# Patient Record
Sex: Female | Born: 1945
Health system: Southern US, Community
[De-identification: ages and names within clinical notes are randomized; demographics above are authoritative.]

## PROBLEM LIST (undated history)

## (undated) DIAGNOSIS — E782 Mixed hyperlipidemia: Secondary | ICD-10-CM

## (undated) DIAGNOSIS — M1712 Unilateral primary osteoarthritis, left knee: Secondary | ICD-10-CM

## (undated) DIAGNOSIS — I10 Essential (primary) hypertension: Secondary | ICD-10-CM

## (undated) HISTORY — DX: Unilateral primary osteoarthritis, left knee: M17.12

## (undated) HISTORY — DX: Essential (primary) hypertension: I10

## (undated) HISTORY — DX: Mixed hyperlipidemia: E78.2

---

## 1998-02-11 ENCOUNTER — Emergency Department (HOSPITAL_COMMUNITY): Admission: EM | Admit: 1998-02-11 | Discharge: 1998-02-11 | Payer: Self-pay | Admitting: Internal Medicine

## 1998-02-14 ENCOUNTER — Other Ambulatory Visit: Admission: RE | Admit: 1998-02-14 | Discharge: 1998-02-14 | Payer: Self-pay | Admitting: Obstetrics and Gynecology

## 1998-02-20 ENCOUNTER — Ambulatory Visit (HOSPITAL_COMMUNITY): Admission: RE | Admit: 1998-02-20 | Discharge: 1998-02-20 | Payer: Self-pay | Admitting: Obstetrics and Gynecology

## 1998-05-09 ENCOUNTER — Emergency Department (HOSPITAL_COMMUNITY): Admission: EM | Admit: 1998-05-09 | Discharge: 1998-05-09 | Payer: Self-pay | Admitting: Emergency Medicine

## 1998-06-27 ENCOUNTER — Emergency Department (HOSPITAL_COMMUNITY): Admission: EM | Admit: 1998-06-27 | Discharge: 1998-06-27 | Payer: Self-pay | Admitting: Emergency Medicine

## 2000-01-11 ENCOUNTER — Emergency Department (HOSPITAL_COMMUNITY): Admission: EM | Admit: 2000-01-11 | Discharge: 2000-01-11 | Payer: Self-pay | Admitting: *Deleted

## 2006-12-22 ENCOUNTER — Emergency Department (HOSPITAL_COMMUNITY): Admission: EM | Admit: 2006-12-22 | Discharge: 2006-12-22 | Payer: Self-pay | Admitting: Emergency Medicine

## 2007-08-02 ENCOUNTER — Emergency Department (HOSPITAL_COMMUNITY): Admission: EM | Admit: 2007-08-02 | Discharge: 2007-08-02 | Payer: Self-pay | Admitting: Emergency Medicine

## 2007-08-15 ENCOUNTER — Emergency Department (HOSPITAL_COMMUNITY): Admission: EM | Admit: 2007-08-15 | Discharge: 2007-08-15 | Payer: Self-pay | Admitting: Emergency Medicine

## 2012-03-28 ENCOUNTER — Emergency Department (HOSPITAL_COMMUNITY)
Admission: EM | Admit: 2012-03-28 | Discharge: 2012-03-28 | Disposition: A | Discharge status: Home / Self Care | Payer: Medicare HMO | Attending: Emergency Medicine | Admitting: Emergency Medicine

## 2012-03-28 ENCOUNTER — Encounter (HOSPITAL_COMMUNITY): Payer: Self-pay | Admitting: *Deleted

## 2012-03-28 DIAGNOSIS — S0180XA Unspecified open wound of other part of head, initial encounter: Secondary | ICD-10-CM | POA: Insufficient documentation

## 2012-03-28 DIAGNOSIS — Y92410 Unspecified street and highway as the place of occurrence of the external cause: Secondary | ICD-10-CM | POA: Insufficient documentation

## 2012-03-28 DIAGNOSIS — S0181XA Laceration without foreign body of other part of head, initial encounter: Secondary | ICD-10-CM

## 2012-03-28 MED ORDER — HYDROCODONE-ACETAMINOPHEN 5-500 MG PO TABS: 1.0000 | ORAL_TABLET | Freq: Four times a day (QID) | ORAL | Status: AC | PRN

## 2012-03-28 NOTE — ED Notes (Signed)
Pt has small lac over R eye. Bleeding controlled. Pt denies LOC. Pt a/o x 4. Pt denies pain elsewhere.

## 2012-03-28 NOTE — ED Notes (Signed)
Wound cleaned.
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
## 2012-03-28 NOTE — ED Notes (Signed)
[id number]<VW> Expected date:03/28/12<BR> Expected time: 7:49 PM<BR> Means of arrival:Ambulance<BR> Comments:<BR> Medical clearance
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
## 2012-03-29 NOTE — ED Provider Notes (Signed)
Medical screening examination/treatment/procedure(s) were performed by non-physician practitioner and as supervising physician I was immediately available for consultation/collaboration.   Gwyneth Sprout, MD 03/29/12 2107

## 2012-10-24 ENCOUNTER — Encounter (HOSPITAL_COMMUNITY): Payer: Self-pay | Admitting: Emergency Medicine

## 2012-10-24 ENCOUNTER — Emergency Department (HOSPITAL_COMMUNITY)
Admission: EM | Admit: 2012-10-24 | Discharge: 2012-10-24 | Disposition: A | Discharge status: Home / Self Care | Payer: Medicare HMO | Attending: Emergency Medicine | Admitting: Emergency Medicine

## 2012-10-24 ENCOUNTER — Emergency Department (HOSPITAL_COMMUNITY): Payer: Medicare HMO

## 2012-10-24 DIAGNOSIS — M25475 Effusion, left foot: Secondary | ICD-10-CM

## 2012-10-24 DIAGNOSIS — M25476 Effusion, unspecified foot: Secondary | ICD-10-CM | POA: Insufficient documentation

## 2012-10-24 DIAGNOSIS — M85672 Other cyst of bone, left ankle and foot: Secondary | ICD-10-CM

## 2012-10-24 DIAGNOSIS — M25473 Effusion, unspecified ankle: Secondary | ICD-10-CM | POA: Insufficient documentation

## 2012-10-24 DIAGNOSIS — M8569 Other cyst of bone, multiple sites: Secondary | ICD-10-CM | POA: Insufficient documentation

## 2012-10-24 MED ORDER — NAPROXEN 375 MG PO TABS: 375.0000 mg | ORAL_TABLET | Freq: Two times a day (BID) | ORAL | Status: DC

## 2012-10-24 NOTE — ED Notes (Signed)
Patient transported to X-ray 

## 2012-10-24 NOTE — ED Provider Notes (Signed)
History     CSN: [id number]  Arrival date & time 10/24/12  1012   First MD Initiated Contact with Patient 10/24/12 1034      Chief Complaint  Patient presents with  . Foot Pain    (Consider location/radiation/quality/duration/timing/severity/associated sxs/prior treatment) HPI Ebony Mayer is a 67 y.o. female who presents with complaint of swelling to the top of the left foot yesterday. States woke up wiht it. Area is "mildly" tender. No injury. No history of the same. No pain with walking or moving of the ankle or foot. Has not tried any medications or treatment for it. History reviewed. No pertinent past medical history.  History reviewed. No pertinent past surgical history.  No family history on file.  History  Substance Use Topics  . Smoking status: Not on file  . Smokeless tobacco: Not on file  . Alcohol Use: No    OB History   Grav Para Term Preterm Abortions TAB SAB Ect Mult Living                  Review of Systems  Constitutional: Negative for fever and chills.  Musculoskeletal: Positive for joint swelling and arthralgias.  Skin: Negative for rash.  Neurological: Negative for weakness and numbness.    Allergies  Review of patient's allergies indicates no known allergies.  Home Medications   Current Outpatient Rx  Name  Route  Sig  Dispense  Refill  . aspirin 325 MG tablet   Oral   Take 650 mg by mouth once.           BP 95/67  Pulse 99  Temp(Src) 97.9 F (36.6 C) (Oral)  Resp 17  SpO2 100%  Physical Exam  Nursing note and vitals reviewed. Constitutional: She appears well-developed and well-nourished. No distress.  Cardiovascular: Normal rate, regular rhythm and normal heart sounds.   Pulmonary/Chest: Effort normal and breath sounds normal. No respiratory distress. She has no wheezes. She has no rales.  Musculoskeletal:  There is a round, well circumscribed cystic mass to the left dorsal foot. Area is not erythematous, only mildly  tender. Full ROM of the foot. Normal pulses.   Neurological: She is alert.  Skin: Skin is warm.    ED Course  Procedures (including critical care time)  Labs Reviewed - No data to display No results found. No results found for this or any previous visit. Dg Foot Complete Left  10/24/2012  *RADIOLOGY REPORT*  Clinical Data: , no known injury, dorsal foot pain  LEFT FOOT - COMPLETE 3+ VIEW  Comparison: None.  Findings: Three views of the left foot submitted.  No acute fracture or subluxation.  Mild dorsal spurring tarsal region.  Mild soft tissue swelling dorsal tarsal region.  Plantar spur of the calcaneus.  IMPRESSION: No acute fracture or subluxation.  Mild dorsal spurring tarsal region.  Mild soft tissue swelling dorsal tarsal region.  Plantar spur of the calcaneus.   Original Report Authenticated By: Natasha Mead, M.D.        1. Other cyst of bone, left ankle and foot   2. Swelling of foot joint, left       MDM  PT with very mildly tender cystic swelling to the left foot. No erythema or signs of infection. There is dorsal spurring that seen on x-ray. Suspect this is the cause of her swelling, with possible cyst formation. i do not think it is infected. She is afebrile, non toxic. Will try tx with elevation, ACE wrap, ice,  follow up with podietrist and orthopedics.        Lottie Mussel, PA 10/24/12 1218

## 2012-10-24 NOTE — ED Notes (Signed)
States that she has an area on the anterior aspect of her left foot swell this am.

## 2012-10-26 NOTE — ED Provider Notes (Signed)
Medical screening examination/treatment/procedure(s) were performed by non-physician practitioner and as supervising physician I was immediately available for consultation/collaboration.  Adna Nofziger T Aundrea Horace, MD 10/26/12 0753 

## 2013-10-10 ENCOUNTER — Encounter: Payer: Self-pay | Admitting: Podiatry

## 2013-10-10 ENCOUNTER — Ambulatory Visit (INDEPENDENT_AMBULATORY_CARE_PROVIDER_SITE_OTHER): Payer: Self-pay | Admitting: Podiatry

## 2013-10-10 VITALS — BP 161/72 | HR 98 | Ht 65.0 in | Wt 154.0 lb

## 2013-10-10 DIAGNOSIS — Q828 Other specified congenital malformations of skin: Secondary | ICD-10-CM

## 2013-10-10 DIAGNOSIS — M25571 Pain in right ankle and joints of right foot: Secondary | ICD-10-CM | POA: Insufficient documentation

## 2013-10-10 DIAGNOSIS — M25579 Pain in unspecified ankle and joints of unspecified foot: Secondary | ICD-10-CM

## 2013-10-10 DIAGNOSIS — B351 Tinea unguium: Secondary | ICD-10-CM | POA: Insufficient documentation

## 2013-10-10 NOTE — Progress Notes (Signed)
Subjective: 68 year old female presents with painful feet. Pain from corns in between 4th web space left, multiple plantar calluses and thick nails.  Objective: Thick hypertrophic deformed nails x 10. HM in 4th web space left. Contracted digit with digital corn 5th bilateral. Plantar callus under 2nd and 5th MPJ bilateral. No open lesions. No edema or erythema noted. Neurovascular status are within normal.  Assessment: Painful corns, calluses and mycotic nails bilateral.  Plan: All corns, calluses, and nails debrided.

## 2013-10-10 NOTE — Patient Instructions (Signed)
Seen for hypertrophic nails, corns, and calluses. All nails, corns, and calluses debrided. Return in 3 months or as needed.  

## 2016-04-27 ENCOUNTER — Encounter (HOSPITAL_COMMUNITY): Payer: Self-pay

## 2016-04-27 ENCOUNTER — Emergency Department (HOSPITAL_COMMUNITY)
Admission: EM | Admit: 2016-04-27 | Discharge: 2016-04-27 | Disposition: A | Discharge status: Home / Self Care | Payer: Commercial Managed Care - HMO | Attending: Emergency Medicine | Admitting: Emergency Medicine

## 2016-04-27 DIAGNOSIS — R109 Unspecified abdominal pain: Secondary | ICD-10-CM | POA: Diagnosis not present

## 2016-04-27 DIAGNOSIS — R1032 Left lower quadrant pain: Secondary | ICD-10-CM | POA: Diagnosis not present

## 2016-04-27 LAB — CBC WITH DIFFERENTIAL/PLATELET
BASOS PCT: 0 %
Basophils Absolute: 0 10*3/uL (ref 0.0–0.1)
Eosinophils Absolute: 0 10*3/uL (ref 0.0–0.7)
Eosinophils Relative: 1 %
HEMATOCRIT: 39.3 % (ref 36.0–46.0)
HEMOGLOBIN: 12.9 g/dL (ref 12.0–15.0)
LYMPHS ABS: 0.9 10*3/uL (ref 0.7–4.0)
Lymphocytes Relative: 23 %
MCH: 29.9 pg (ref 26.0–34.0)
MCHC: 32.8 g/dL (ref 30.0–36.0)
MCV: 91.2 fL (ref 78.0–100.0)
MONOS PCT: 6 %
Monocytes Absolute: 0.2 10*3/uL (ref 0.1–1.0)
NEUTROS ABS: 2.7 10*3/uL (ref 1.7–7.7)
NEUTROS PCT: 70 %
Platelets: 136 10*3/uL — ABNORMAL LOW (ref 150–400)
RBC: 4.31 MIL/uL (ref 3.87–5.11)
RDW: 12.2 % (ref 11.5–15.5)
WBC: 3.9 10*3/uL — ABNORMAL LOW (ref 4.0–10.5)

## 2016-04-27 LAB — URINALYSIS, ROUTINE W REFLEX MICROSCOPIC
BILIRUBIN URINE: NEGATIVE
GLUCOSE, UA: NEGATIVE mg/dL
Ketones, ur: NEGATIVE mg/dL
LEUKOCYTES UA: NEGATIVE
Nitrite: NEGATIVE
PH: 7 (ref 5.0–8.0)
Protein, ur: NEGATIVE mg/dL
Specific Gravity, Urine: 1.005 (ref 1.005–1.030)

## 2016-04-27 LAB — COMPREHENSIVE METABOLIC PANEL
ALBUMIN: 4.2 g/dL (ref 3.5–5.0)
ALK PHOS: 65 U/L (ref 38–126)
ALT: 13 U/L — AB (ref 14–54)
ANION GAP: 4 — AB (ref 5–15)
AST: 18 U/L (ref 15–41)
BILIRUBIN TOTAL: 0.7 mg/dL (ref 0.3–1.2)
BUN: 10 mg/dL (ref 6–20)
CALCIUM: 8.8 mg/dL — AB (ref 8.9–10.3)
CO2: 26 mmol/L (ref 22–32)
CREATININE: 0.71 mg/dL (ref 0.44–1.00)
Chloride: 109 mmol/L (ref 101–111)
GFR calc Af Amer: >60 mL/min (ref >60)
GFR calc non Af Amer: >60 mL/min (ref >60)
GLUCOSE: 105 mg/dL — AB (ref 65–99)
Potassium: 4.1 mmol/L (ref 3.5–5.1)
Sodium: 139 mmol/L (ref 135–145)
TOTAL PROTEIN: 7.3 g/dL (ref 6.5–8.1)

## 2016-04-27 LAB — LIPASE, BLOOD: Lipase: 20 U/L (ref 11–51)

## 2016-04-27 LAB — URINE MICROSCOPIC-ADD ON

## 2016-04-27 MED ORDER — IBUPROFEN 600 MG PO TABS: 600.0000 mg | ORAL_TABLET | Freq: Four times a day (QID) | ORAL | 0 refills | Status: DC | PRN

## 2016-04-27 MED ORDER — CYCLOBENZAPRINE HCL 10 MG PO TABS: 10.0000 mg | ORAL_TABLET | Freq: Two times a day (BID) | ORAL | 0 refills | Status: DC | PRN

## 2016-04-27 NOTE — Discharge Instructions (Signed)
Take your medications as prescribed to stand for pain relief. Please follow up with a primary care provider from the Resource Guide provided below in 1 week as needed. Please return to the Emergency Department if symptoms worsen or new onset of fever, abdominal pain, back/flank pain, pain with urination, numbness, tingling, weakness.

## 2016-04-27 NOTE — Progress Notes (Signed)
Pt confirmed with ED CM that she does not have a [employer] pcp Cm provided her with a 5 page list of providers and discussed the importance of follow pcp care

## 2016-04-27 NOTE — ED Triage Notes (Signed)
Pt presents with c/o left side pain. Pt is in the left lower quadrant, no flank pain, vomiting, or diarrhea. Pt reports the pain is present for the most part with movement.

## 2016-04-27 NOTE — ED Provider Notes (Signed)
[facility]-EMERGENCY DEPT Provider Note   CSN: [id number] Arrival date & time: 04/27/16  1056     History   Chief Complaint Chief Complaint  Patient presents with  . Side pain    HPI Ebony Mayer is a 70 y.o. female.  Patient is a 70 year old female with no pertinent past medical history presents the ED with complaint of left side pain, onset 3 days. Patient reports when she woke up Sunday morning she began having a mild dull aching pain to the left side of her abdomen/back. Patient reports pain is worse with movement or bending. Denies any known fall, trauma or injury but notes she has been working over the past few days which requires her to do lots of bending and squatting. Denies fever, chills, headache, lightheadedness, dizziness, chest pain, shortness of breath, cough, abdominal pain, nausea, vomiting, diarrhea, constipation, urinary symptoms, flank pain, back pain. Denies taking any medications at home.      History reviewed. No pertinent past medical history.  Patient Active Problem List   Diagnosis Date Noted  . Onychomycosis 10/10/2013  . Porokeratosis 10/10/2013  . Pain, joint, foot, right 10/10/2013    History reviewed. No pertinent surgical history.  OB History    No data available       Home Medications    Prior to Admission medications   Not on File    Family History No family history on file.  Social History Social History  Substance Use Topics  . Smoking status: Never Smoker  . Smokeless tobacco: Never Used  . Alcohol use No     Allergies   Review of patient's allergies indicates no known allergies.   Review of Systems Review of Systems  Musculoskeletal: Positive for arthralgias (left sided pain).  All other systems reviewed and are negative.    Physical Exam Updated Vital Signs BP 148/77   Pulse 84   Temp 98.1 F (36.7 C)   Resp 18   Ht 5\' 5"  (1.651 m)   Wt 72.1 kg   SpO2 100%   BMI 26.46 kg/m   Physical Exam    Constitutional: She is oriented to person, place, and time. She appears well-developed and well-nourished. No distress.  HENT:  Head: Normocephalic and atraumatic.  Mouth/Throat: Oropharynx is clear and moist. No oropharyngeal exudate.  Eyes: Conjunctivae and EOM are normal. Right eye exhibits no discharge. Left eye exhibits no discharge. No scleral icterus.  Neck: Normal range of motion. Neck supple.  Cardiovascular: Normal rate, regular rhythm, normal heart sounds and intact distal pulses.   Pulmonary/Chest: Effort normal and breath sounds normal. No respiratory distress. She has no wheezes. She has no rales. She exhibits no tenderness.  Abdominal: Soft. Bowel sounds are normal. She exhibits no distension and no mass. There is no tenderness. There is no rebound and no guarding. No hernia.  No CVA tenderness  Musculoskeletal: Normal range of motion. She exhibits tenderness (TTP over left abdominal obliques). She exhibits no edema.  FROM of bilateral hips with 5/5 strength. Pt reports pain to left lateral abdomen/side with flexion of left hip. 2+ DP pulses. Sensation grossly intact. No midline C, T, or L tenderness. Full range of motion of neck and back. Full range of motion of bilateral upper and lower extremities, with 5/5 strength. Patient able to stand and ambulate without assistance.    Neurological: She is alert and oriented to person, place, and time.  Skin: Skin is warm and dry. She is not diaphoretic.  Nursing note and  vitals reviewed.    ED Treatments / Results  Labs (all labs ordered are listed, but only abnormal results are displayed) Labs Reviewed  CBC WITH DIFFERENTIAL/PLATELET - Abnormal; Notable for the following:       Result Value   WBC 3.9 (*)    Platelets 136 (*)    All other components within normal limits  COMPREHENSIVE METABOLIC PANEL - Abnormal; Notable for the following:    Glucose, Bld 105 (*)    Calcium 8.8 (*)    ALT 13 (*)    Anion gap 4 (*)    All other  components within normal limits  URINALYSIS, ROUTINE W REFLEX MICROSCOPIC (NOT AT [facility]) - Abnormal; Notable for the following:    Hgb urine dipstick TRACE (*)    All other components within normal limits  URINE MICROSCOPIC-ADD ON - Abnormal; Notable for the following:    Squamous Epithelial / LPF 0-5 (*)    Bacteria, UA RARE (*)    All other components within normal limits  LIPASE, BLOOD    EKG  EKG Interpretation None       Radiology No results found.  Procedures Procedures (including critical care time)  Medications Ordered in ED Medications - No data to display   Initial Impression / Assessment and Plan / ED Course  I have reviewed the triage vital signs and the nursing notes.  Pertinent labs & imaging results that were available during my care of the patient were reviewed by me and considered in my medical decision making (see chart for details).  Clinical Course   Pt presents with left sided lateral abdominal/trunk pain for the past 3 days, worse with movement. Denies any other sxs. Reports bending and lifting a lot with her job. Denies any known injury. VSS. Exam revealed mild TTP of left lateral oblique, no abdominal tenderness, no CVA tenderness, remaining exam unremarkable. Pt reports her pain has improved since arrival to the ED. Labs and UA unremarkable. I suspect pt's sxs are likeley due to due to muscle strain associated with repetitive movement at her job. Plan to d/c pt home with symptomatic tx and PCP follow up. Discussed return precautions with pt.   Final Clinical Impressions(s) / ED Diagnoses   Final diagnoses:  None    New Prescriptions New Prescriptions   No medications on file     Barrett Henleicole Elizabeth Perley Arthurs, PA-C 04/27/16 1520    Raeford RazorStephen Kohut, MD 05/01/16 1327

## 2016-04-29 ENCOUNTER — Emergency Department (HOSPITAL_COMMUNITY)
Admission: EM | Admit: 2016-04-29 | Discharge: 2016-04-29 | Disposition: A | Discharge status: Home / Self Care | Payer: Commercial Managed Care - HMO | Attending: Emergency Medicine | Admitting: Emergency Medicine

## 2016-04-29 ENCOUNTER — Encounter (HOSPITAL_COMMUNITY): Payer: Self-pay | Admitting: Emergency Medicine

## 2016-04-29 DIAGNOSIS — S39011D Strain of muscle, fascia and tendon of abdomen, subsequent encounter: Secondary | ICD-10-CM | POA: Insufficient documentation

## 2016-04-29 DIAGNOSIS — Z791 Long term (current) use of non-steroidal anti-inflammatories (NSAID): Secondary | ICD-10-CM | POA: Insufficient documentation

## 2016-04-29 DIAGNOSIS — X501XXD Overexertion from prolonged static or awkward postures, subsequent encounter: Secondary | ICD-10-CM | POA: Insufficient documentation

## 2016-04-29 DIAGNOSIS — R1032 Left lower quadrant pain: Secondary | ICD-10-CM | POA: Diagnosis present

## 2016-04-29 MED ORDER — TRAMADOL HCL 50 MG PO TABS: 100.0000 mg | ORAL_TABLET | Freq: Four times a day (QID) | ORAL | 0 refills | Status: DC | PRN

## 2016-04-29 NOTE — ED Provider Notes (Signed)
[facility]-EMERGENCY DEPT Provider Note   CSN: [id number] Arrival date & time: 04/29/16  1209     History   Chief Complaint Chief Complaint  Patient presents with  . Abdominal Pain    HPI Ebony Mayer is a 70 y.o. female.  HPI Patient was evaluated 3 days ago for the same pain complaint. She has had a proximally 5 days of discomfort in her left anterior hip\lower abdomen that radiates to the upper thigh. Patient is pain-free if she is at rest or seated. Pain is only elicited if she is in a standing position or flexes at the hip. No associated GI, GU or respiratory symptoms on review of systems. No associated weakness numbness or tingling to the leg. Patient denies significant relief of symptoms with ibuprofen. For work, the patient does janitorial jobs and mopping. She reports it does require a lot of movement and bending but has not had a specific injury that she recalls. History reviewed. No pertinent past medical history.  Patient Active Problem List   Diagnosis Date Noted  . Onychomycosis 10/10/2013  . Porokeratosis 10/10/2013  . Pain, joint, foot, right 10/10/2013    History reviewed. No pertinent surgical history.  OB History    No data available       Home Medications    Prior to Admission medications   Medication Sig Start Date End Date Taking? Authorizing Provider  cyclobenzaprine (FLEXERIL) 10 MG tablet Take 1 tablet (10 mg total) by mouth 2 (two) times daily as needed for muscle spasms. 04/27/16  Yes Barrett HenleNicole Elizabeth Nadeau, PA-C  ibuprofen (ADVIL,MOTRIN) 600 MG tablet Take 1 tablet (600 mg total) by mouth every 6 (six) hours as needed. 04/27/16  Yes Barrett HenleNicole Elizabeth Nadeau, PA-C  traMADol (ULTRAM) 50 MG tablet Take 2 tablets (100 mg total) by mouth every 6 (six) hours as needed. 04/29/16   Arby BarretteMarcy Gaylon Bentz, MD    Family History History reviewed. No pertinent family history.  Social History Social History  Substance Use Topics  . Smoking status: Never Smoker  .  Smokeless tobacco: Never Used  . Alcohol use No     Allergies   Review of patient's allergies indicates no known allergies.   Review of Systems Review of Systems 10 Systems reviewed and are negative for acute change except as noted in the HPI.   Physical Exam Updated Vital Signs BP 156/87   Pulse 94   Temp 98.2 F (36.8 C) (Oral)   Resp 18   SpO2 100%   Physical Exam  Constitutional: She appears well-developed and well-nourished. No distress.  HENT:  Head: Normocephalic and atraumatic.  Eyes: EOM are normal.  Neck: Neck supple.  Cardiovascular: Normal rate and regular rhythm.   No murmur heard. Pulmonary/Chest: Effort normal and breath sounds normal. No respiratory distress.  Abdominal: Soft. She exhibits no distension and no mass. There is no tenderness. There is no guarding.  Pain is slightly reproducible at the anterior inguinal ligament of the lower abdomen. There is no palpable mass in this region. Femoral pulses are 2+ and symmetric. The abdomen itself is nontender to deep palpation. Pain is also elicited in this focal region by having the patient either sit up from supine position as well as stand and put weight on the left hip.  Musculoskeletal: Normal range of motion. She exhibits no edema or deformity. Tenderness: range of motion is intact.no gait dysfunction.  Neurological: She is alert.  Skin: Skin is warm and dry.  Psychiatric: She has a normal mood  and affect.  Nursing note and vitals reviewed.    ED Treatments / Results  Labs (all labs ordered are listed, but only abnormal results are displayed) Labs Reviewed - No data to display  EKG  EKG Interpretation None       Radiology No results found.  Procedures Procedures (including critical care time)  Medications Ordered in ED Medications - No data to display   Initial Impression / Assessment and Plan / ED Course  I have reviewed the triage vital signs and the nursing notes.  Pertinent labs  & imaging results that were available during my care of the patient were reviewed by me and considered in my medical decision making (see chart for details).  Clinical Course    Final Clinical Impressions(s) / ED Diagnoses   Final diagnoses:  Abdominal wall strain, subsequent encounter  At this time, I still feel this is most likely muscular skeletal pain. It is only reproducible by certain positions in no associated symptoms have developed. The patient is otherwise well appearance. Diagnostic evaluation was negative for yesterday. The patient is counseled on continued ibuprofen and Flexeril. Tramadol's added. She is advised to give 5-7 days to see if symptoms improve or resolve. She is also counseled to return if new or changing symptoms develop. She is aware of the need to schedule follow-up to reassess this problem.  New Prescriptions New Prescriptions   TRAMADOL (ULTRAM) 50 MG TABLET    Take 2 tablets (100 mg total) by mouth every 6 (six) hours as needed.     Arby BarretteMarcy Lakin Rhine, MD 04/29/16 [phone]

## 2016-04-29 NOTE — Discharge Instructions (Signed)
Continue to take ibuprofen 3 times a day with food for the next 3-5 days. Take Flexeril as prescribed as well. You may take tramadol if needed as prescribed. You need to schedule recheck with a family doctor. Return to the emergency department if you develop new or changing symptoms. If your pain has not improved or resolved within the next one to 2 weeks, you may need additional testing to look for another cause for your pain.

## 2016-04-29 NOTE — ED Triage Notes (Signed)
Pt c/o pain to left hip, leg, and LLQ abdomen on standing onset Sunday, seen in ED for same on Tuesday and diagnosed with muscle strain. No pain while sitting. No nausea, emesis, or diarrhea.

## 2016-05-13 ENCOUNTER — Ambulatory Visit: Payer: Self-pay | Admitting: Nurse Practitioner

## 2016-05-25 ENCOUNTER — Encounter: Payer: Self-pay | Admitting: Internal Medicine

## 2016-05-25 ENCOUNTER — Ambulatory Visit: Payer: Commercial Managed Care - HMO | Attending: Internal Medicine | Admitting: Internal Medicine

## 2016-05-25 VITALS — BP 145/100 | HR 116 | Temp 98.2°F | Resp 16 | Wt 158.2 lb

## 2016-05-25 DIAGNOSIS — I10 Essential (primary) hypertension: Secondary | ICD-10-CM

## 2016-05-25 DIAGNOSIS — T148XXA Other injury of unspecified body region, initial encounter: Secondary | ICD-10-CM

## 2016-05-25 DIAGNOSIS — X58XXXA Exposure to other specified factors, initial encounter: Secondary | ICD-10-CM | POA: Diagnosis not present

## 2016-05-25 DIAGNOSIS — S76912A Strain of unspecified muscles, fascia and tendons at thigh level, left thigh, initial encounter: Secondary | ICD-10-CM | POA: Diagnosis not present

## 2016-05-25 DIAGNOSIS — T148 Other injury of unspecified body region: Secondary | ICD-10-CM

## 2016-05-25 MED ORDER — HYDROCHLOROTHIAZIDE 25 MG PO TABS
25.0000 mg | ORAL_TABLET | Freq: Every day | ORAL | 0 refills | Status: DC
Start: 2016-05-25 — End: 2016-05-31

## 2016-05-25 NOTE — Patient Instructions (Addendum)
Hypertension Hypertension is another name for high blood pressure. High blood pressure forces your heart to work harder to pump blood. A blood pressure reading has two numbers, which includes a higher number over a lower number (example: 110/72). HOME CARE   Have your blood pressure rechecked by your doctor.  Only take medicine as told by your doctor. Follow the directions carefully. The medicine does not work as well if you skip doses. Skipping doses also puts you at risk for problems.  Do not smoke.  Monitor your blood pressure at home as told by your doctor. GET HELP IF:  You think you are having a reaction to the medicine you are taking.  You have repeat headaches or feel dizzy.  You have puffiness (swelling) in your ankles.  You have trouble with your vision. GET HELP RIGHT AWAY IF:   You get a very bad headache and are confused.  You feel weak, numb, or faint.  You get chest or belly (abdominal) pain.  You throw up (vomit).  You cannot breathe very well. MAKE SURE YOU:   Understand these instructions.  Will watch your condition.  Will get help right away if you are not doing well or get worse.   This information is not intended to replace advice given to you by your health care provider. Make sure you discuss any questions you have with your health care provider.   Document Released: 02/16/2008 Document Revised: 09/04/2013 Document Reviewed: 06/22/2013 Elsevier Interactive Patient Education 2016 Elsevier Inc.  - DASH Eating Plan DASH stands for "Dietary Approaches to Stop Hypertension." The DASH eating plan is a healthy eating plan that has been shown to reduce high blood pressure (hypertension). Additional health benefits may include reducing the risk of type 2 diabetes mellitus, heart disease, and stroke. The DASH eating plan may also help with weight loss. WHAT DO I NEED TO KNOW ABOUT THE DASH EATING PLAN? For the DASH eating plan, you will follow these  general guidelines:  Choose foods with a percent daily value for sodium of less than 5% (as listed on the food label).  Use salt-free seasonings or herbs instead of table salt or sea salt.  Check with your health care provider or pharmacist before using salt substitutes.  Eat lower-sodium products, often labeled as "lower sodium" or "no salt added."  Eat fresh foods.  Eat more vegetables, fruits, and low-fat dairy products.  Choose whole grains. Look for the word "whole" as the first word in the ingredient list.  Choose fish and skinless chicken or turkey more often than red meat. Limit fish, poultry, and meat to 6 oz (170 g) each day.  Limit sweets, desserts, sugars, and sugary drinks.  Choose heart-healthy fats.  Limit cheese to 1 oz (28 g) per day.  Eat more home-cooked food and less restaurant, buffet, and fast food.  Limit fried foods.  Cook foods using methods other than frying.  Limit canned vegetables. If you do use them, rinse them well to decrease the sodium.  When eating at a restaurant, ask that your food be prepared with less salt, or no salt if possible. WHAT FOODS CAN I EAT? Seek help from a dietitian for individual calorie needs. Grains Whole grain or whole wheat bread. Brown rice. Whole grain or whole wheat pasta. Quinoa, bulgur, and whole grain cereals. Low-sodium cereals. Corn or whole wheat flour tortillas. Whole grain cornbread. Whole grain crackers. Low-sodium crackers. Vegetables Fresh or frozen vegetables (raw, steamed, roasted, or grilled). Low-sodium or   reduced-sodium tomato and vegetable juices. Low-sodium or reduced-sodium tomato sauce and paste. Low-sodium or reduced-sodium canned vegetables.  Fruits All fresh, canned (in natural juice), or frozen fruits. Meat and Other Protein Products Ground beef (85% or leaner), grass-fed beef, or beef trimmed of fat. Skinless chicken or [place]. Ground chicken or [place]. Pork trimmed of fat. All fish and  seafood. Eggs. Dried beans, peas, or lentils. Unsalted nuts and seeds. Unsalted canned beans. Dairy Low-fat dairy products, such as skim or 1% milk, 2% or reduced-fat cheeses, low-fat ricotta or cottage cheese, or plain low-fat yogurt. Low-sodium or reduced-sodium cheeses. Fats and Oils Tub margarines without trans fats. Light or reduced-fat mayonnaise and salad dressings (reduced sodium). Avocado. Safflower, olive, or canola oils. Natural peanut or almond butter. Other Unsalted popcorn and pretzels. The items listed above may not be a complete list of recommended foods or beverages. Contact your dietitian for more options. WHAT FOODS ARE NOT RECOMMENDED? Grains White bread. White pasta. White rice. Refined cornbread. Bagels and croissants. Crackers that contain trans fat. Vegetables Creamed or fried vegetables. Vegetables in a cheese sauce. Regular canned vegetables. Regular canned tomato sauce and paste. Regular tomato and vegetable juices. Fruits Dried fruits. Canned fruit in light or heavy syrup. Fruit juice. Meat and Other Protein Products Fatty cuts of meat. Ribs, chicken wings, bacon, sausage, bologna, salami, chitterlings, fatback, hot dogs, bratwurst, and packaged luncheon meats. Salted nuts and seeds. Canned beans with salt. Dairy Whole or 2% milk, cream, half-and-half, and cream cheese. Whole-fat or sweetened yogurt. Full-fat cheeses or blue cheese. Nondairy creamers and whipped toppings. Processed cheese, cheese spreads, or cheese curds. Condiments Onion and garlic salt, seasoned salt, table salt, and sea salt. Canned and packaged gravies. Worcestershire sauce. Tartar sauce. Barbecue sauce. Teriyaki sauce. Soy sauce, including reduced sodium. Steak sauce. Fish sauce. Oyster sauce. Cocktail sauce. Horseradish. Ketchup and mustard. Meat flavorings and tenderizers. Bouillon cubes. Hot sauce. Tabasco sauce. Marinades. Taco seasonings. Relishes. Fats and Oils Butter, stick margarine,  lard, shortening, ghee, and bacon fat. Coconut, palm kernel, or palm oils. Regular salad dressings. Other Pickles and olives. Salted popcorn and pretzels. The items listed above may not be a complete list of foods and beverages to avoid. Contact your dietitian for more information. WHERE CAN I FIND MORE INFORMATION? National Heart, Lung, and Blood Institute: [employer]   This information is not intended to replace advice given to you by your health care provider. Make sure you discuss any questions you have with your health care provider.   Document Released: 08/19/2011 Document Revised: 09/20/2014 Document Reviewed: 07/04/2013 Elsevier Interactive Patient Education 2016 Elsevier Inc.  -  Muscle Strain A muscle strain (pulled muscle) happens when a muscle is stretched beyond normal length. It happens when a sudden, violent force stretches your muscle too far. Usually, a few of the fibers in your muscle are torn. Muscle strain is common in athletes. Recovery usually takes 1-2 weeks. Complete healing takes 5-6 weeks.  HOME CARE   Follow the PRICE method of treatment to help your injury get better. Do this the first 2-3 days after the injury:  Protect. Protect the muscle to keep it from getting injured again.  Rest. Limit your activity and rest the injured body part.  Ice. Put ice in a plastic bag. Place a towel between your skin and the bag. Then, apply the ice and leave it on from 15-20 minutes each hour. After the third day, switch to moist heat packs.  Compression. Use a splint or elastic  bandage on the injured area for comfort. Do not put it on too tightly.  Elevate. Keep the injured body part above the level of your heart.  Only take medicine as told by your doctor.  Warm up before doing exercise to prevent future muscle strains. GET HELP IF:   You have more pain or puffiness (swelling) in the injured area.  You feel numbness,  tingling, or notice a loss of strength in the injured area. MAKE SURE YOU:   Understand these instructions.  Will watch your condition.  Will get help right away if you are not doing well or get worse.   This information is not intended to replace advice given to you by your health care provider. Make sure you discuss any questions you have with your health care provider.   Document Released: 06/08/2008 Document Revised: 06/20/2013 Document Reviewed: 03/29/2013 Elsevier Interactive Patient Education 2016 Elsevier Inc.   -  RICE for Routine Care of Injuries Many injuries can be cared for using rest, ice, compression, and elevation (RICE therapy). Using RICE therapy can help to lessen pain and swelling. It can help your body to heal. Rest Reduce your normal activities and avoid using the injured part of your body. You can go back to your normal activities when you feel okay and your doctor says it is okay. Ice Do not put ice on your bare skin.  Put ice in a plastic bag.  Place a towel between your skin and the bag.  Leave the ice on for 20 minutes, 2-3 times a day. Do this for as long as told by your doctor. Compression Compression means putting pressure on the injured area. This can be done with an elastic bandage. If an elastic bandage has been applied:  Remove and reapply the bandage every 3-4 hours or as told by your doctor.  Make sure the bandage is not wrapped too tight. Wrap the bandage more loosely if part of your body beyond the bandage is blue, swollen, cold, painful, or loses feeling (numb).  See your doctor if the bandage seems to make your problems worse. Elevation Elevation means keeping the injured area raised. Raise the injured area above your heart or the center of your chest if you can. WHEN SHOULD I GET HELP? You should get help if:  You keep having pain and swelling.  Your symptoms get worse. WHEN SHOULD I GET HELP RIGHT AWAY? You should get help  right away if:  You have sudden bad pain at or below the area of your injury.  You have redness or more swelling around your injury.  You have tingling or numbness at or below the injury that does not go away when you take off the bandage.   This information is not intended to replace advice given to you by your health care provider. Make sure you discuss any questions you have with your health care provider.   Document Released: 02/16/2008 Document Revised: 05/21/2015 Document Reviewed: 08/07/2014 Elsevier Interactive Patient Education [employer]2016 Elsevier Inc.

## 2016-05-25 NOTE — Progress Notes (Signed)
Ebony Mayer, is a 70 y.o. female  [id number]  [id number]  DOB - 23-May-1946  CC:  Chief Complaint  Patient presents with  . Follow-up    ED       HPI: Ebony Mayer is a 70 y.o. female here today to f/u ER visit on 04/29/16. Pt denies significant pmhx.  No hx of htn that she is aware.  In that ER visit, pt was dx with msck pain, and dc w/ tramadol. Pt states her abd pain is resolved, but after long periods of walking, develops left lat thigh pain, discribed as achy in nature.  Denies numbness/tingling. Denies pain currently. Did try heat pad for a bit. She tried biofreeze cream on it last night, which did cause some burning.  Does not smoke/drink.  Here w/ her grown dgt.  Cnincidentally, pt has new fu w/ PCP [facility] on Monday.  Patient has No headache, No chest pain, No abdominal pain - No Nausea, No new weakness tingling or numbness, No Cough - SOB.    Review of Systems: Per HPI, o/w all systems reviewed and negative.   No Known Allergies No past medical history on file. Current Outpatient Prescriptions on File Prior to Visit  Medication Sig Dispense Refill  . cyclobenzaprine (FLEXERIL) 10 MG tablet Take 1 tablet (10 mg total) by mouth 2 (two) times daily as needed for muscle spasms. (Patient not taking: Reported on 05/25/2016) 20 tablet 0  . ibuprofen (ADVIL,MOTRIN) 600 MG tablet Take 1 tablet (600 mg total) by mouth every 6 (six) hours as needed. (Patient not taking: Reported on 05/25/2016) 30 tablet 0  . traMADol (ULTRAM) 50 MG tablet Take 2 tablets (100 mg total) by mouth every 6 (six) hours as needed. (Patient not taking: Reported on 05/25/2016) 20 tablet 0   No current facility-administered medications on file prior to visit.    No family history on file. Social History   Social History  . Marital status: Single    Spouse name: N/A  . Number of children: N/A  . Years of education: N/A   Occupational History  . Not on file.   Social History Main  Topics  . Smoking status: Never Smoker  . Smokeless tobacco: Never Used  . Alcohol use No  . Drug use: No  . Sexual activity: Not on file   Other Topics Concern  . Not on file   Social History Narrative  . No narrative on file    Objective:   Vitals:   05/25/16 1050  BP: (!) 145/100  Pulse: (!) 116  Resp: 16  Temp: 98.2 F (36.8 C)    Filed Weights   05/25/16 1050  Weight: 158 lb 3.2 oz (71.8 kg)    BP Readings from Last 3 Encounters:  05/25/16 (!) 145/100  04/29/16 156/87  04/27/16 148/77    Physical Exam: Constitutional: Patient appears well-developed and well-nourished. No distress. AAOx3, pleasant. HENT: Normocephalic, atraumatic, External right and left ear normal. Oropharynx is clear and moist.  Eyes: Conjunctivae and EOM are normal. PERRL, no scleral icterus. Neck: Normal ROM. Neck supple. No JVD.  CVS: RRR, S1/S2 +, no murmurs, no gallops, no carotid bruit.  Pulmonary: Effort and breath sounds normal, no stridor, rhonchi, wheezes, rales.  Abdominal: Soft. BS +, no distension, tenderness, rebound or guarding.  Musculoskeletal: Normal range of motion. No edema and no tenderness.   Neg left straight leg test, no pain on extension/flexion/internal and external rotation of left leg obtained.  LE: bilat/ no  c/c/e, pulses 2+ bilateral. Neuro: Alert. Normal reflexes, muscle tone coordination wnl. No cranial nerve deficit grossly. Skin: Skin is warm and dry. No rash noted. Not diaphoretic. No erythema. No pallor. Psychiatric: Normal mood and affect. Behavior, judgment, thought content normal.  Lab Results  Component Value Date   WBC 3.9 (L) 04/27/2016   HGB 12.9 04/27/2016   HCT 39.3 04/27/2016   MCV 91.2 04/27/2016   PLT 136 (L) 04/27/2016   Lab Results  Component Value Date   CREATININE 0.71 04/27/2016   BUN 10 04/27/2016   NA 139 04/27/2016   K 4.1 04/27/2016   CL 109 04/27/2016   CO2 26 04/27/2016    No results found for: HGBA1C Lipid Panel  No  results found for: CHOL, TRIG, HDL, CHOLHDL, VLDL, LDLCALC     [facility] 05/25/2016  Decreased Interest 0  Down, Depressed, Hopeless 0  PHQ - 2 Score 0    Assessment and plan:   1. HTN (hypertension), benign - review of prior bps, pt hx of elevated bp  In past as well, including ED visit 04/29/16, 04/27/16, office visit 10/10/13. Recd low salt /dash diet. Increase exercise recd, info given - hctz 25 qday started. - recd f/u w/ pcp.  2. Muscle strain, left leg Suspected, less likely lat cutaneous nerve irritation given denies burning or periph nerve findings. Recd RICE, warm heat, muscle stretches, info provided.   Return if symptoms worsen or fail to improve.  The patient was given clear instructions to go to ER or return to medical center if symptoms don't improve, worsen or new problems develop. The patient verbalized understanding. The patient was told to call to get lab results if they haven't heard anything in the next week.    This note has been created with Education officer, environmentalDragon speech recognition software and smart phrase technology. Any transcriptional errors are unintentional.   Pete Glatterawn T Babak Lucus, MD, MBA/MHA [facility] [place], [place] [phone]   05/25/2016, 12:01 PM

## 2016-05-25 NOTE — Progress Notes (Signed)
Pt is in the office today for ed follow-up PT states her pain level today in the office is a 10 when walking when sitting 4-5 Pt states her pain is coming from her left leg.Marland Kitchen.JRP

## 2016-05-31 ENCOUNTER — Encounter: Payer: Self-pay | Admitting: Nurse Practitioner

## 2016-05-31 ENCOUNTER — Ambulatory Visit (INDEPENDENT_AMBULATORY_CARE_PROVIDER_SITE_OTHER): Payer: Commercial Managed Care - HMO | Admitting: Nurse Practitioner

## 2016-05-31 ENCOUNTER — Other Ambulatory Visit (INDEPENDENT_AMBULATORY_CARE_PROVIDER_SITE_OTHER): Payer: Commercial Managed Care - HMO

## 2016-05-31 VITALS — BP 138/86 | HR 105 | Temp 97.8°F | Ht 65.0 in | Wt 156.0 lb

## 2016-05-31 DIAGNOSIS — M79602 Pain in left arm: Secondary | ICD-10-CM

## 2016-05-31 DIAGNOSIS — M79605 Pain in left leg: Secondary | ICD-10-CM | POA: Diagnosis not present

## 2016-05-31 DIAGNOSIS — R6889 Other general symptoms and signs: Secondary | ICD-10-CM

## 2016-05-31 DIAGNOSIS — Z0001 Encounter for general adult medical examination with abnormal findings: Secondary | ICD-10-CM | POA: Diagnosis not present

## 2016-05-31 DIAGNOSIS — M79606 Pain in leg, unspecified: Secondary | ICD-10-CM | POA: Insufficient documentation

## 2016-05-31 DIAGNOSIS — I83812 Varicose veins of left lower extremities with pain: Secondary | ICD-10-CM | POA: Insufficient documentation

## 2016-05-31 DIAGNOSIS — Z0000 Encounter for general adult medical examination without abnormal findings: Secondary | ICD-10-CM | POA: Diagnosis not present

## 2016-05-31 DIAGNOSIS — Z780 Asymptomatic menopausal state: Secondary | ICD-10-CM

## 2016-05-31 DIAGNOSIS — I10 Essential (primary) hypertension: Secondary | ICD-10-CM | POA: Diagnosis not present

## 2016-05-31 LAB — LIPID PANEL
CHOLESTEROL: 177 mg/dL (ref 0–200)
HDL: 42.5 mg/dL (ref >39.00)
LDL CALC: 107 mg/dL — AB (ref 0–99)
NonHDL: 134.33
Total CHOL/HDL Ratio: 4
Triglycerides: 137 mg/dL (ref 0.0–149.0)
VLDL: 27.4 mg/dL (ref 0.0–40.0)

## 2016-05-31 LAB — HEPATITIS C ANTIBODY: HCV AB: NEGATIVE

## 2016-05-31 LAB — TSH: TSH: 1.02 u[IU]/mL (ref 0.35–4.50)

## 2016-05-31 LAB — HEMOGLOBIN A1C: HEMOGLOBIN A1C: 5.3 % (ref 4.6–6.5)

## 2016-05-31 MED ORDER — HYDROCHLOROTHIAZIDE 25 MG PO TABS: 25.0000 mg | ORAL_TABLET | Freq: Every day | ORAL | 0 refills | Status: DC

## 2016-05-31 MED ORDER — IBUPROFEN 600 MG PO TABS: 600.0000 mg | ORAL_TABLET | Freq: Four times a day (QID) | ORAL | 0 refills | Status: DC | PRN

## 2016-05-31 MED ORDER — CYCLOBENZAPRINE HCL 10 MG PO TABS: 5.0000 mg | ORAL_TABLET | Freq: Two times a day (BID) | ORAL | 0 refills | Status: DC | PRN

## 2016-05-31 NOTE — Patient Instructions (Addendum)
Left lateral leg pain possibly related to IT band syndrome vs varicose veins. Review lumbar spine x-ray. Go to the basement for lab draw and lower back x-ray. Use ibuprofen and Flexeril as needed for leg pain. Take ibuprofen with food. Do not use Flexeril and operate heavy machinery.  Iliotibial Band Syndrome With Rehab The iliotibial (IT) band is a tendon that connects the hip muscles to the shinbone (tibia) and to one of the bones of the pelvis (ileum). The IT band passes by the knee and is often irritated by the outer portion of the knee (lateral femoral condyle). A fluid filled sac (bursa) exists between the tendon and the bone, to cushion and reduce friction. Overuse of the tendon may cause excessive friction, which results in IT band syndrome. This condition involves inflammation of the bursa (bursitis) and/or inflammation of the IT band (tendinitis). SYMPTOMS   Pain, tenderness, swelling, warmth, or redness over the IT band, at the outer knee (above the joint).  Pain that travels up or down the thigh or leg.  Initially, pain at the beginning of an exercise, that decreases once warmed up. Eventually, pain throughout the activity, getting worse as the activity continues. May cause the athlete to stop in the middle of training or competing.  Pain that gets worse when running down hills or stairs, on banked tracks, or next to the curb on the street.  Pain that increases when the foot of the affected leg hits the ground.  Possibly, a crackling sound (crepitation) when the tendon or bursa is moved or touched. CAUSES  IT band syndrome is caused by irritation of the IT band and the underlying bursa. This eventually results in inflammation and pain. IT band syndrome is an overuse injury.  RISK INCREASES WITH:  Sports with repetitive knee-bending activities (distance running, cycling).  Incorrect training techniques, including sudden changes in the intensity, frequency, or duration of  training.  Not enough rest between workouts.  Poor strength and flexibility, especially a tight IT band.  Failure to warm up properly before activity.  Bow legs.  Arthritis of the knee. PREVENTION   Warm up and stretch properly before activity.  Allow for adequate recovery between workouts.  Maintain physical fitness:  Strength, flexibility, and endurance.  Cardiovascular fitness.  Learn and use proper training technique, including reducing running mileage, shortening stride, and avoiding running on hills and banked surfaces.  Wear arch supports (orthotics), if you have flat feet. PROGNOSIS  If treated properly, IT band syndrome usually goes away within 6 weeks of treatment. RELATED COMPLICATIONS   Longer healing time, if not properly treated, or if not given enough time to heal.  Recurring inflammation of the tendon and bursa, that may result in a chronic condition.  Recurring symptoms, if activity is resumed too soon, with overuse, with a direct blow, or with poor training technique.  Inability to complete training or competition. TREATMENT  Treatment first involves the use of ice and medicine, to reduce pain and inflammation. The use of strengthening and stretching exercises may help reduce pain with activity. These exercises may be performed at home or with a therapist. For individuals with flat feet, an arch support (orthotic) may be helpful. Some individuals find that wearing a knee sleeve or compression bandage around the knee during workouts provides some relief. Certain training techniques, such as adjusting stride length, avoiding running on hills or stairs, changing the direction you run on a circular or banked track, or changing the side of the road  you run on, if you run next to the curb, may help decrease symptoms of IT band syndrome. Cyclists may need to change the seat height or foot position on their bicycles. An injection of cortisone into the bursa may be  recommended. Surgery to remove the inflamed bursa and/or part of the IT band is only considered after at least 6 months of non-surgical treatment.  MEDICATION   If pain medicine is needed, nonsteroidal anti-inflammatory medicines (aspirin and ibuprofen), or other minor pain relievers (acetaminophen), are often advised.  Do not take pain medicine for 7 days before surgery.  Prescription pain relievers may be given, if your caregiver thinks they are needed. Use only as directed and only as much as you need.  Corticosteroid injections may be given by your caregiver. These injections should be reserved for the most serious cases, because they may only be given a certain number of times. HEAT AND COLD  Cold treatment (icing) should be applied for 10 to 15 minutes every 2 to 3 hours for inflammation and pain, and immediately after activity that aggravates your symptoms. Use ice packs or an ice massage.  Heat treatment may be used before performing stretching and strengthening activities prescribed by your caregiver, physical therapist, or athletic trainer. Use a heat pack or a warm water soak. SEEK MEDICAL CARE IF:   Symptoms get worse or do not improve in 2 to 4 weeks, despite treatment.  New, unexplained symptoms develop. (Drugs used in treatment may produce side effects.) EXERCISES  RANGE OF MOTION (ROM) AND STRETCHING EXERCISES - Iliotibial Band Syndrome These exercises may help you when beginning to rehabilitate your injury. Your symptoms may go away with or without further involvement from your physician, physical therapist or athletic trainer. While completing these exercises, remember:   Restoring tissue flexibility helps normal motion to return to the joints. This allows healthier, less painful movement and activity.  An effective stretch should be held for at least 30 seconds.  A stretch should never be painful. You should only feel a gentle lengthening or release in the stretched  tissue. STRETCH - Quadriceps, Prone   Lie on your stomach on a firm surface, such as a bed or padded floor.  Bend your right / left knee and grasp your ankle. If you are unable to reach your ankle or pant leg, use a belt around your foot to lengthen your reach.  Gently pull your heel toward your buttocks. Your knee should not slide out to the side. You should feel a stretch in the front of your thigh and knee.  Hold this position for __________ seconds. Repeat __________ times. Complete this stretch __________ times per day.  STRETCH - Iliotibial Band  On the floor or bed, lie on your side, so your right / left leg is on top. Bend your knee and grab your ankle.  Slowly bring your knee back so that your thigh is in line with your trunk. Keep your heel at your buttocks and gently arch your back, so your head, shoulders and hips line up.  Slowly lower your leg so that your knee approaches the floor or bed, until you feel a gentle stretch on the outside of your right / left thigh. If you do not feel a stretch and your knee will not fall farther, place the heel of your opposite foot on top of your knee, and pull your thigh down farther.  Hold this stretch for __________ seconds. Repeat __________ times. Complete this stretch __________  times per day. STRENGTHENING EXERCISES - Iliotibial Band Syndrome Improving the flexibility of the IT band will best relieve your discomfort due to IT band syndrome. Strengthening exercises, however, can help improve both muscle endurance and joint mechanics, reducing the factors that can contribute to this condition. Your physician, physical therapist or athletic trainer may provide you with exercises that train specific muscle groups that are especially weak. The following exercises target muscles that are often weak in people who have IT band syndrome. STRENGTH - Hip Abductors, Straight Leg Raises  Be aware of your form throughout the entire exercise, so that you  exercise the correct muscles. Poor form means that you are not strengthening the correct muscles.  Lie on your side, so that your head, shoulders, knee and hip line up. You may bend your lower knee to help maintain your balance. Your right / left leg should be on top.  Roll your hips slightly forward, so that your hips are stacked directly over each other and your right / left knee is facing forward.  Lift your top leg up 4-6 inches, leading with your heel. Be sure that your foot does not drift forward and that your knee does not roll toward the ceiling.  Hold this position for __________ seconds. You should feel the muscles in your outer hip lifting (you may not notice this until your leg begins to tire).  Slowly lower your leg to the starting position. Allow the muscles to fully relax before beginning the next repetition. Repeat __________ times. Complete this exercise __________ times per day.  STRENGTH - Quad/VMO, Isometric  Sit in a chair with your right / left knee slightly bent. With your fingertips, feel the VMO muscle (just above the inside of your knee). The VMO is important in controlling the position of your kneecap.  Keeping your fingertips on this muscle. Without actually moving your leg, attempt to drive your knee down, as if straightening your leg. You should feel your VMO tense. If you have a difficult time, you may wish to try the same exercise on your healthy knee first.  Tense this muscle as hard as you can, without increasing any knee pain.  Hold for __________ seconds. Relax the muscles slowly and completely between each repetition. Repeat __________ times. Complete this exercise __________ times per day.    This information is not intended to replace advice given to you by your health care provider. Make sure you discuss any questions you have with your health care provider.   Document Released: 08/30/2005 Document Revised: 09/20/2014 Document Reviewed:  12/12/2008 Elsevier Interactive Patient Education [employer]2016 [employer] Inc.

## 2016-05-31 NOTE — Assessment & Plan Note (Signed)
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

## 2016-05-31 NOTE — Progress Notes (Signed)
Normal results Follow-up in 3 months as discussed.

## 2016-05-31 NOTE — Progress Notes (Addendum)
Subjective:    Patient ID: Ebony Mayer, female    DOB: [birth year], 69 y.o.   MRN: [id number]  Patient presents today for complete physical or establish care (new patient) and Left lateral leg painx67month  Leg Pain   The incident occurred more than 1 week ago (1 month). Incident location: No injury. There was no injury mechanism. The pain is present in the left thigh. The quality of the pain is described as aching and burning. The pain is at a severity of 0/10 (No pain during exam, reports pain up to the scale of 10 after prolonged standing or walking). The pain has been intermittent since onset. Associated symptoms include tingling. Pertinent negatives include no inability to bear weight, loss of motion, loss of sensation, muscle weakness or numbness. She reports no foreign bodies present. The symptoms are aggravated by movement and weight bearing. She has tried NSAIDs (Flexeril and tramadol) for the symptoms. The treatment provided mild relief.    Immunizations: (TDAP, Hep C screen, Pneumovax, Influenza, zoster)  Health Maintenance  Topic Date Due  . Mammogram  04/08/1997  . Colon Cancer Screening  04/08/1997  . Shingles Vaccine  04/09/2007  . Pneumonia vaccines (1 of 2 - PCV13) 04/08/2012  . Tetanus Vaccine  08/13/2016*  . Flu Shot  12/11/2016*  . DEXA scan (bone density measurement)  Completed  .  Hepatitis C: One time screening is recommended by Center for Disease Control  (CDC) for  adults born from [birth year] through 1964.   Completed  *Topic was postponed. The date shown is not the original due date.  She declined any vaccinations today.  Diet:none Weight:  Wt Readings from Last 3 Encounters:  07/16/16 164 lb (74.4 kg)  07/08/16 161 lb (73 kg)  06/15/16 161 lb (73 kg)    Exercise:None Fall Risk: Denies any fall in the last 3 months No flowsheet data found. Depression/Suicide: Depression screen [facility] 2/9 05/25/2016  Decreased Interest 0  Down, Depressed, Hopeless 0  PHQ - 2  Score 0   No flowsheet data found. Colonoscopy (every 5-53yrs, >50-68yrs): Never done Dexa (every 2-16yrs, >58yrs): Never done Pap Smear (every 75yrs for >21-29 without HPV, every 59yrs for >30-59yrs with HPV): Never done Mammogram (yearly, >68yrs): Never done PSA (yearly, >36yrs):Never done Vision: Needed Dental: Needed Advanced Directive: Advanced Directives 05/25/2016  Does patient have an advance directive? No  Would patient like information on creating an advanced directive? No - patient declined information    Medications and allergies reviewed with patient and updated if appropriate.  Patient Active Problem List   Diagnosis Date Noted  . Patellofemoral arthritis of left knee 07/08/2016  . It band syndrome, left 06/15/2016  . Essential hypertension, benign 05/31/2016  . Leg pain, lateral 05/31/2016  . Varicose veins of left lower extremity with pain 05/31/2016  . Onychomycosis 10/10/2013  . Porokeratosis 10/10/2013    No current outpatient prescriptions on file prior to visit.   No current facility-administered medications on file prior to visit.     Past Medical History:  Diagnosis Date  . Hypertension   . Patellofemoral arthritis of left knee 07/08/2016    History reviewed. No pertinent surgical history.  Social History   Social History  . Marital status: Single    Spouse name: N/A  . Number of children: N/A  . Years of education: N/A   Social History Main Topics  . Smoking status: Never Smoker  . Smokeless tobacco: Never Used  . Alcohol use No  .  Drug use: No  . Sexual activity: Not Currently   Other Topics Concern  . None   Social History Narrative  . None    Family History  Problem Relation Age of Onset  . Diabetes Mother   . Heart attack Mother   . Cancer Sister   . Heart attack Brother         Review of Systems  Constitutional: Negative for fever, malaise/fatigue and weight loss.  HENT: Negative for congestion and sore throat.     Eyes:       Negative for visual changes  Respiratory: Negative for cough and shortness of breath.   Cardiovascular: Negative for chest pain, palpitations and leg swelling.  Gastrointestinal: Negative for blood in stool, constipation, diarrhea, heartburn and melena.  Genitourinary: Negative for dysuria, frequency and urgency.  Musculoskeletal: Positive for joint pain. Negative for back pain, falls and myalgias.       Left lateral leg pain, ongoing for 1 month.  Skin: Negative for rash.  Neurological: Positive for tingling. Negative for dizziness, sensory change, focal weakness, numbness and headaches.  Endo/Heme/Allergies: Does not bruise/bleed easily.  Psychiatric/Behavioral: Negative for depression, substance abuse and suicidal ideas. The patient is not nervous/anxious.     Objective:   Vitals:   05/31/16 1359  BP: 138/86  Pulse: (!) 105  Temp: 97.8 F (36.6 C)    Body mass index is 25.96 kg/m.   Physical Examination:  Physical Exam  Constitutional: She is oriented to person, place, and time and well-developed, well-nourished, and in no distress. No distress.  HENT:  Right Ear: External ear normal.  Left Ear: External ear normal.  Nose: Nose normal.  Mouth/Throat: Oropharynx is clear and moist. No oropharyngeal exudate.  Eyes: Conjunctivae and EOM are normal. Pupils are equal, round, and reactive to light. No scleral icterus.  Neck: Normal range of motion. Neck supple. Thyromegaly present.  Cardiovascular: Normal rate, regular rhythm, normal heart sounds and intact distal pulses.   Pulmonary/Chest: Effort normal and breath sounds normal. Right breast exhibits no mass, no nipple discharge and no skin change. Left breast exhibits no mass, no nipple discharge and no skin change. Breasts are symmetrical.  Abdominal: Soft. Bowel sounds are normal. She exhibits no distension. There is no tenderness.  Genitourinary: Rectum normal and vulva normal. Cervix exhibits no motion  tenderness.  Musculoskeletal: Normal range of motion. She exhibits no edema or tenderness.       Right hip: Normal.       Left hip: Normal.       Left knee: Normal.       Left ankle: Normal.       Lumbar back: Normal.       Left upper leg: Normal.  Negative straight leg raise.  Neurological: She is alert and oriented to person, place, and time. She has normal reflexes. Gait normal.  Negative straight leg raise.   Skin: Skin is warm and dry.  Psychiatric: Affect and judgment normal.  Vitals reviewed.   ASSESSMENT and PLAN:  Ebony Mayer was seen today for new patient (initial visit).  Diagnoses and all orders for this visit:  Encounter for preventative adult health care exam with abnormal findings -     Cancel: CBC w/Diff; Future -     Cancel: Comprehensive metabolic panel; Future -     TSH; Future -     Hemoglobin A1c; Future -     Lipid Profile; Future -     Ambulatory referral to Gastroenterology -  MM DIGITAL DIAGNOSTIC BILATERAL; Future -     HIV antibody (with reflex) -     Hepatitis C Antibody; Future  Varicose veins of left lower extremity with pain  Essential hypertension, benign -     Cancel: Comprehensive metabolic panel; Future -     Discontinue: hydrochlorothiazide (HYDRODIURIL) 25 MG tablet; Take 1 tablet (25 mg total) by mouth daily.  Leg pain, lateral, left -     Cancel: XR Lumbar Spine 2-3 Views; Future -     Discontinue: ibuprofen (ADVIL,MOTRIN) 600 MG tablet; Take 1 tablet (600 mg total) by mouth every 6 (six) hours as needed (pain, take with food). -     Discontinue: cyclobenzaprine (FLEXERIL) 10 MG tablet; Take 0.5 tablets (5 mg total) by mouth 2 (two) times daily as needed for muscle spasms. -     DG Lumbar Spine 2-3 Views; Future  Post-menopause -     DG Bone Density; Future    Essential hypertension, benign Review CMP. Consider adding amlodipine or lisinopril if no improvement of blood pressure in 3 months.  Leg pain, lateral Review lumbar  spine x-ray. Use ibuprofen, Flexeril and IT band exercises for now.  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     Follow up: Return in about 3 months (around 08/30/2016) for HTN AND LEFT LEG PAIN.  Alysia Pennaharlotte Kahlie Deutscher, NP

## 2016-05-31 NOTE — Assessment & Plan Note (Signed)
Review CMP. Consider adding amlodipine or lisinopril if no improvement of blood pressure in 3 months.

## 2016-05-31 NOTE — Progress Notes (Signed)
Pre visit review using our clinic review tool, if applicable. No additional management support is needed unless otherwise documented below in the visit note. 

## 2016-05-31 NOTE — Assessment & Plan Note (Signed)
Review lumbar spine x-ray. Use ibuprofen, Flexeril and IT band exercises for now.

## 2016-06-01 LAB — HIV ANTIBODY (ROUTINE TESTING W REFLEX): HIV: NONREACTIVE

## 2016-06-01 NOTE — Progress Notes (Signed)
Negative HIV and hep C

## 2016-06-07 ENCOUNTER — Telehealth: Payer: Self-pay

## 2016-06-07 NOTE — Telephone Encounter (Signed)
Patient called back. I informed her of the notes. She understood. She will be following up in 3 months. Thank you.

## 2016-06-11 ENCOUNTER — Encounter: Payer: Self-pay | Admitting: Gastroenterology

## 2016-06-15 ENCOUNTER — Encounter: Payer: Self-pay | Admitting: Nurse Practitioner

## 2016-06-15 ENCOUNTER — Ambulatory Visit (INDEPENDENT_AMBULATORY_CARE_PROVIDER_SITE_OTHER): Payer: Commercial Managed Care - HMO | Admitting: Nurse Practitioner

## 2016-06-15 ENCOUNTER — Ambulatory Visit (INDEPENDENT_AMBULATORY_CARE_PROVIDER_SITE_OTHER)
Admission: RE | Admit: 2016-06-15 | Discharge: 2016-06-15 | Disposition: A | Discharge status: Home / Self Care | Payer: Commercial Managed Care - HMO | Source: Ambulatory Visit | Attending: Nurse Practitioner | Admitting: Nurse Practitioner

## 2016-06-15 VITALS — BP 142/98 | HR 86 | Temp 97.8°F | Ht 65.0 in | Wt 161.0 lb

## 2016-06-15 DIAGNOSIS — M1612 Unilateral primary osteoarthritis, left hip: Secondary | ICD-10-CM | POA: Diagnosis not present

## 2016-06-15 DIAGNOSIS — I10 Essential (primary) hypertension: Secondary | ICD-10-CM | POA: Diagnosis not present

## 2016-06-15 DIAGNOSIS — M79605 Pain in left leg: Secondary | ICD-10-CM

## 2016-06-15 DIAGNOSIS — I83812 Varicose veins of left lower extremities with pain: Secondary | ICD-10-CM | POA: Diagnosis not present

## 2016-06-15 DIAGNOSIS — M7632 Iliotibial band syndrome, left leg: Secondary | ICD-10-CM | POA: Diagnosis not present

## 2016-06-15 DIAGNOSIS — M47816 Spondylosis without myelopathy or radiculopathy, lumbar region: Secondary | ICD-10-CM | POA: Diagnosis not present

## 2016-06-15 MED ORDER — AMLODIPINE BESYLATE 5 MG PO TABS: 5.0000 mg | ORAL_TABLET | Freq: Every day | ORAL | 3 refills | Status: DC

## 2016-06-15 MED ORDER — GABAPENTIN 300 MG PO CAPS: 300.0000 mg | ORAL_CAPSULE | Freq: Every day | ORAL | 3 refills | Status: DC

## 2016-06-15 MED ORDER — MELOXICAM 7.5 MG PO TABS: 7.5000 mg | ORAL_TABLET | Freq: Every day | ORAL | 0 refills | Status: DC

## 2016-06-15 NOTE — Assessment & Plan Note (Signed)
Uncontrolled with HCTZ alone. Start amlodipine 5mg   f/up in 57month

## 2016-06-15 NOTE — Progress Notes (Signed)
Pre visit review using our clinic review tool, if applicable. No additional management support is needed unless otherwise documented below in the visit note. 

## 2016-06-15 NOTE — Progress Notes (Signed)
Subjective:  Patient ID: Ebony Mayer, female    DOB: Nov 21, 1945  Age: 70 y.o. MRN: [id number]  CC: Leg Pain (Pt stated upper left leg still painful)   Leg Pain   The incident occurred more than 1 week ago. There was no injury mechanism. The pain is present in the left thigh. The quality of the pain is described as aching. Pain scale: pain with prolonged standing or walking. Pain severity now: no pain during office visit. The pain has been intermittent since onset. Pertinent negatives include no inability to bear weight, loss of motion, muscle weakness or numbness. She reports no foreign bodies present. The symptoms are aggravated by movement and weight bearing. She has tried acetaminophen and NSAIDs (tramadol and muscle relaxant) for the symptoms. The treatment provided no relief.  did not do leg exercise as recommended  Outpatient Medications Prior to Visit  Medication Sig Dispense Refill  . hydrochlorothiazide (HYDRODIURIL) 25 MG tablet Take 1 tablet (25 mg total) by mouth daily. 90 tablet 0  . cyclobenzaprine (FLEXERIL) 10 MG tablet Take 0.5 tablets (5 mg total) by mouth 2 (two) times daily as needed for muscle spasms. 20 tablet 0  . ibuprofen (ADVIL,MOTRIN) 600 MG tablet Take 1 tablet (600 mg total) by mouth every 6 (six) hours as needed (pain, take with food). 30 tablet 0   No facility-administered medications prior to visit.     ROS See HPI  Objective:  BP (!) 142/98 (BP Location: Left Arm, Patient Position: Sitting, Cuff Size: Normal)   Pulse 86   Temp 97.8 F (36.6 C)   Ht 5\' 5"  (1.651 m)   Wt 161 lb (73 kg)   SpO2 98%   BMI 26.79 kg/m   BP Readings from Last 3 Encounters:  06/15/16 (!) 142/98  05/31/16 138/86  05/25/16 (!) 145/100    Wt Readings from Last 3 Encounters:  06/15/16 161 lb (73 kg)  05/31/16 156 lb (70.8 kg)  05/25/16 158 lb 3.2 oz (71.8 kg)    Physical Exam  Constitutional: She is oriented to person, place, and time.  Cardiovascular: Normal  rate and normal heart sounds.   Pulmonary/Chest: Effort normal and breath sounds normal.  Musculoskeletal: Normal range of motion. She exhibits no edema.       Right hip: Normal.       Left hip: Normal.       Right knee: Normal.       Left knee: Normal.       Right ankle: Normal.       Left ankle: Normal.       Lumbar back: Normal.       Right upper leg: Normal.       Left upper leg: Normal.       Right lower leg: Normal.       Left lower leg: Normal.       Right foot: Normal.       Left foot: Normal.  Negative straight leg raise  Neurological: She is alert and oriented to person, place, and time.  Vitals reviewed.   Lab Results  Component Value Date   WBC 3.9 (L) 04/27/2016   HGB 12.9 04/27/2016   HCT 39.3 04/27/2016   PLT 136 (L) 04/27/2016   GLUCOSE 105 (H) 04/27/2016   CHOL 177 05/31/2016   TRIG 137.0 05/31/2016   HDL 42.50 05/31/2016   LDLCALC 107 (H) 05/31/2016   ALT 13 (L) 04/27/2016   AST 18 04/27/2016   NA 139 04/27/2016  K 4.1 04/27/2016   CL 109 04/27/2016   CREATININE 0.71 04/27/2016   BUN 10 04/27/2016   CO2 26 04/27/2016   TSH 1.02 05/31/2016   HGBA1C 5.3 05/31/2016    No results found.  Assessment & Plan:   [name] was seen today for leg pain.  Diagnoses and all orders for this visit:  It band syndrome, left -     Cancel: XR FEMUR MIN 2 VIEWS LEFT; Future -     gabapentin (NEURONTIN) 300 MG capsule; Take 1 capsule (300 mg total) by mouth at bedtime. -     DG FEMUR MIN 2 VIEWS LEFT; Future -     Ambulatory referral to Physical Therapy -     Ambulatory referral to Sports Medicine -     meloxicam (MOBIC) 7.5 MG tablet; Take 1 tablet (7.5 mg total) by mouth daily.  Varicose veins of left lower extremity with pain  Essential hypertension, benign -     amLODipine (NORVASC) 5 MG tablet; Take 1 tablet (5 mg total) by mouth at bedtime.   I have discontinued Ms. Harriger's ibuprofen, cyclobenzaprine, and traMADol. I am also having her start on  amLODipine, gabapentin, and meloxicam. Additionally, I am having her maintain her hydrochlorothiazide.  Meds ordered this encounter  Medications  . DISCONTD: traMADol (ULTRAM) 50 MG tablet  . amLODipine (NORVASC) 5 MG tablet    Sig: Take 1 tablet (5 mg total) by mouth at bedtime.    Dispense:  30 tablet    Refill:  3    Order Specific Question:   Supervising Provider    Answer:   Tresa Garter [[id number]]  . gabapentin (NEURONTIN) 300 MG capsule    Sig: Take 1 capsule (300 mg total) by mouth at bedtime.    Dispense:  30 capsule    Refill:  3    Order Specific Question:   Supervising Provider    Answer:   Tresa Garter [[id number]]  . meloxicam (MOBIC) 7.5 MG tablet    Sig: Take 1 tablet (7.5 mg total) by mouth daily.    Dispense:  30 tablet    Refill:  0    Order Specific Question:   Supervising Provider    Answer:   Tresa Garter [[id number]]    Follow-up: Return in about 1 month (around 07/16/2016) for HTN and leg pain.  Ebony Penna, NP

## 2016-06-15 NOTE — Patient Instructions (Addendum)
Ref for PT if x-ray are normal. Will call with x-ray results.  Check BP at home once a day and record. Bring BP records to next office visit.

## 2016-06-15 NOTE — Assessment & Plan Note (Addendum)
Femur,and  lumbar spine X-ray indicate OA and DDD. No improvement with ibuprofen, flexeril and tramadol. She was unable to do home leg exercises. Start gabapentin and meloxicam. Referred for PT and sports medicine.

## 2016-06-24 ENCOUNTER — Ambulatory Visit: Payer: Commercial Managed Care - HMO

## 2016-06-29 ENCOUNTER — Telehealth: Payer: Self-pay

## 2016-06-29 NOTE — Telephone Encounter (Signed)
Ebony Mayer called back. Informed pt that the appt with Dr. Katrinka BlazingSmith is for the dx of arthritis. Pt dtr stated understanding and was reassured that pt will have relief from pain.

## 2016-06-29 NOTE — Telephone Encounter (Signed)
Dtr Bradly Bienenstock(Kisha) lmom rq advise on what pt can do about the pain caused by arthritis.   Called dtr at [phone]. No answer. Lvmm.

## 2016-07-07 NOTE — Progress Notes (Signed)
[facility] [address] [place], [place] [address] Phone: [phone] Subjective:    I'm seeing this patient by the request  QI:[id number] Nche, NP    CC: Leg pain, iliotibial band syndrome  XLK:[id number]  Ebony Mayer is a 70 y.o. female coming in with complaint of left-sided leg pain. Patient is now had this pain for 2-3 weeks. States that it is mostly in the left side. Describes an aching pain. Rates the severity of pain is 4 out of 10 but can wax and wane. Seems to be worse after prolonged standing or walking. Still able to bear weight. No weakness or numbness. No radiation the pain. Has tried over-the-counter medications with very mild improvement. Patient states that it is so intermittent that she has not notice anything that seems to exacerbate it the most. Denies any fevers chills or any abnormal weight loss.     Past Medical History:  Diagnosis Date  . Hypertension    No past surgical history on file. Social History   Social History  . Marital status: Single    Spouse name: N/A  . Number of children: N/A  . Years of education: N/A   Social History Main Topics  . Smoking status: Never Smoker  . Smokeless tobacco: Never Used  . Alcohol use No  . Drug use: No  . Sexual activity: Not Currently   Other Topics Concern  . Not on file   Social History Narrative  . No narrative on file   No Known Allergies Family History  Problem Relation Age of Onset  . Diabetes Mother   . Heart attack Mother   . Cancer Sister   . Heart attack Brother     Past medical history, social, surgical and family history all reviewed in electronic medical record.  No pertanent information unless stated regarding to the chief complaint.   Review of Systems: No headache, visual changes, nausea, vomiting, diarrhea, constipation, dizziness, abdominal pain, skin rash, fevers, chills, night sweats, weight loss, swollen lymph nodes, body aches, joint swelling,  muscle aches, chest pain, shortness of breath, mood changes.   Objective  There were no vitals taken for this visit.  General: No apparent distress alert and oriented x3 mood and affect normal, dressed appropriately.  HEENT: Pupils equal, extraocular movements intact  Respiratory: Patient's speak in full sentences and does not appear short of breath  Cardiovascular: No lower extremity edema, non tender, no erythema  Skin: Warm dry intact with no signs of infection or rash on extremities or on axial skeleton.  Abdomen: Soft nontender  Neuro: Cranial nerves II through XII are intact, neurovascularly intact in all extremities with 2+ DTRs and 2+ pulses.  Lymph: No lymphadenopathy of posterior or anterior cervical chain or axillae bilaterally.  Gait normal with good balance and coordination. Patient does have some mild weakness of the left hip with Trendelenburg. MSK:  Non tender with full range of motion and good stability and symmetric strength and tone of shoulders, elbows, wrist, hip, and ankles bilaterally.  Left knee exam shows the patient is a mild valgus deformity of the left knee compared to the right knee. Patient does have some mild crepitus with range of motion. Mild positive patellar grind test. Full strength and negative McMurray's. No significant instability noted today. Nontender on exam of the distal femur. Significant tightness of the iliotibial band compared to the contralateral side. Pain with Faber distally but not proximally. No pain with internal rotation of the  hip.  Procedure note   97110; 15 minutes spent for Therapeutic exercises as stated in above notes.  This included exercises focusing on stretching, strengthening, with significant focus on eccentric aspects. HPelvic tilt/bracing to help with proper recruitment of the lower abs and pelvic floor muscles  Glute strengthening to properly contract glutes without over-engaging low back and hamstrings - prone hip extension and  glute bridge exercises Proper stretching techniques to increase effectiveness for the hip flexors, groin, quads, piriformic and low back when appropriate     Proper technique shown and discussed handout in great detail with ATC.  All questions were discussed and answered.   Impression and Recommendations:     This case required medical decision making of moderate complexity.      Note: This dictation was prepared with Dragon dictation along with smaller phrase technology. Any transcriptional errors that result from this process are unintentional.

## 2016-07-08 ENCOUNTER — Ambulatory Visit (INDEPENDENT_AMBULATORY_CARE_PROVIDER_SITE_OTHER): Payer: Commercial Managed Care - HMO | Admitting: Family Medicine

## 2016-07-08 ENCOUNTER — Encounter: Payer: Self-pay | Admitting: Family Medicine

## 2016-07-08 DIAGNOSIS — M1712 Unilateral primary osteoarthritis, left knee: Secondary | ICD-10-CM | POA: Diagnosis not present

## 2016-07-08 DIAGNOSIS — M7632 Iliotibial band syndrome, left leg: Secondary | ICD-10-CM | POA: Diagnosis not present

## 2016-07-08 HISTORY — DX: Unilateral primary osteoarthritis, left knee: M17.12

## 2016-07-08 NOTE — Assessment & Plan Note (Signed)
He does have some mild iliotibial band syndrome that is noted distally. Significant tightness with Pearlean BrownieFaber test. We discussed home exercises, topical anti-inflammatories, encourage her to continue with the gabapentin. I do not feel that lumbar radiculopathy at this time is warranted for any further workup such as back x-rays. I do feel that there could be some referred pain secondary to a patellofemoral arthritis. Patient was also given exercises for this. Trial of topical anti-inflammatories. Follow-up again in 4-6 weeks.

## 2016-07-08 NOTE — Patient Instructions (Signed)
God to see you  I think this is the knee and the IT band Exercises 3 times a week.  Ice 20 minutes 2 times daily. Usually after activity and before bed. pennsaid pinkie amount topically 2 times daily as needed.  Wear good shoes at all times.  See me again in 4-6 weeks and we will consider injecting the knee.

## 2016-07-08 NOTE — Assessment & Plan Note (Signed)
Patient does have what appears to be more of a patellofemoral arthritis causing referred pain to the distal aspect of the lateral aspect of the femur. Discussed with patient about exercises, home exercises. Work with Event organiserathletic trainer. We discussed topical anti-inflammatories. Discussed proper shoes. Follow-up in 4-6 weeks

## 2016-07-16 ENCOUNTER — Ambulatory Visit (INDEPENDENT_AMBULATORY_CARE_PROVIDER_SITE_OTHER)
Admission: RE | Admit: 2016-07-16 | Discharge: 2016-07-16 | Disposition: A | Discharge status: Home / Self Care | Payer: Commercial Managed Care - HMO | Source: Ambulatory Visit | Attending: Nurse Practitioner | Admitting: Nurse Practitioner

## 2016-07-16 ENCOUNTER — Encounter: Payer: Self-pay | Admitting: Nurse Practitioner

## 2016-07-16 ENCOUNTER — Ambulatory Visit (INDEPENDENT_AMBULATORY_CARE_PROVIDER_SITE_OTHER): Payer: Commercial Managed Care - HMO | Admitting: Nurse Practitioner

## 2016-07-16 VITALS — BP 132/92 | HR 86 | Temp 97.6°F | Wt 164.0 lb

## 2016-07-16 DIAGNOSIS — Z780 Asymptomatic menopausal state: Secondary | ICD-10-CM

## 2016-07-16 DIAGNOSIS — Z0001 Encounter for general adult medical examination with abnormal findings: Secondary | ICD-10-CM

## 2016-07-16 DIAGNOSIS — M7632 Iliotibial band syndrome, left leg: Secondary | ICD-10-CM | POA: Diagnosis not present

## 2016-07-16 DIAGNOSIS — M1712 Unilateral primary osteoarthritis, left knee: Secondary | ICD-10-CM | POA: Diagnosis not present

## 2016-07-16 DIAGNOSIS — I10 Essential (primary) hypertension: Secondary | ICD-10-CM | POA: Diagnosis not present

## 2016-07-16 MED ORDER — HYDROCHLOROTHIAZIDE 25 MG PO TABS: 25.0000 mg | ORAL_TABLET | Freq: Every morning | ORAL | 3 refills | Status: DC

## 2016-07-16 MED ORDER — MELOXICAM 7.5 MG PO TABS: 7.5000 mg | ORAL_TABLET | Freq: Every day | ORAL | 3 refills | Status: DC | PRN

## 2016-07-16 NOTE — Patient Instructions (Signed)
Continue BP check once a day. Maintain upcoming appt with Dr. Katrinka BlazingSmith. Continue exercise as recommended by dr. Steve RattlerSmiith.

## 2016-07-16 NOTE — Progress Notes (Signed)
Subjective:  Patient ID: Ebony Mayer, female    DOB: 07-13-1946  Age: 70 y.o. MRN: [id number]  CC: Hypertension  Hypertension  This is a chronic problem. The current episode started more than 1 month ago. The problem has been gradually improving since onset. The problem is uncontrolled. Pertinent negatives include no anxiety, blurred vision, chest pain, headaches, malaise/fatigue, orthopnea, palpitations, peripheral edema or shortness of breath. There are no associated agents to hypertension. Risk factors for coronary artery disease include post-menopausal state and sedentary lifestyle. Past treatments include calcium channel blockers and diuretics. Compliance problems: she stopped taking HCTZ.    Left leg pain: Intermittent, worse with prolonged standing. Current use of meloxicam and pennsaid gel as needed. With moderate refilef. Has upcoming appt with Dr. Katrinka BlazingSmith.  Outpatient Medications Prior to Visit  Medication Sig Dispense Refill  . amLODipine (NORVASC) 5 MG tablet Take 1 tablet (5 mg total) by mouth at bedtime. 30 tablet 3  . gabapentin (NEURONTIN) 300 MG capsule Take 1 capsule (300 mg total) by mouth at bedtime. 30 capsule 3  . meloxicam (MOBIC) 7.5 MG tablet      No facility-administered medications prior to visit.     ROS See HPI  Objective:  BP (!) 132/92 (BP Location: Left Arm, Patient Position: Sitting, Cuff Size: Normal)   Pulse 86   Temp 97.6 F (36.4 C)   Wt 164 lb (74.4 kg)   SpO2 98%   BMI 27.29 kg/m   BP Readings from Last 3 Encounters:  07/16/16 (!) 132/92  07/08/16 128/78  06/15/16 (!) 142/98    Wt Readings from Last 3 Encounters:  07/16/16 164 lb (74.4 kg)  07/08/16 161 lb (73 kg)  06/15/16 161 lb (73 kg)    Physical Exam  Constitutional: She is oriented to person, place, and time. No distress.  Eyes: No scleral icterus.  Neck: Normal range of motion. Neck supple.  Cardiovascular: Normal rate, regular rhythm and normal heart sounds.     Pulmonary/Chest: Effort normal and breath sounds normal. No respiratory distress.  Musculoskeletal: Normal range of motion. She exhibits no edema.  Neurological: She is alert and oriented to person, place, and time.  Skin: Skin is warm and dry.  Psychiatric: She has a normal mood and affect. Her behavior is normal.    Lab Results  Component Value Date   WBC 3.9 (L) 04/27/2016   HGB 12.9 04/27/2016   HCT 39.3 04/27/2016   PLT 136 (L) 04/27/2016   GLUCOSE 105 (H) 04/27/2016   CHOL 177 05/31/2016   TRIG 137.0 05/31/2016   HDL 42.50 05/31/2016   LDLCALC 107 (H) 05/31/2016   ALT 13 (L) 04/27/2016   AST 18 04/27/2016   NA 139 04/27/2016   K 4.1 04/27/2016   CL 109 04/27/2016   CREATININE 0.71 04/27/2016   BUN 10 04/27/2016   CO2 26 04/27/2016   TSH 1.02 05/31/2016   HGBA1C 5.3 05/31/2016    No results found.  Assessment & Plan:   Ebony Mayer was seen today for hypertension.  Diagnoses and all orders for this visit:  Essential hypertension, benign -     hydrochlorothiazide (HYDRODIURIL) 25 MG tablet; Take 1 tablet (25 mg total) by mouth every morning.  Patellofemoral arthritis of left knee -     meloxicam (MOBIC) 7.5 MG tablet; Take 1 tablet (7.5 mg total) by mouth daily as needed for pain. With food  It band syndrome, left -     meloxicam (MOBIC) 7.5 MG tablet; Take 1  tablet (7.5 mg total) by mouth daily as needed for pain. With food   I have changed Ms. Nifong's meloxicam and hydrochlorothiazide. I am also having her maintain her amLODipine, gabapentin, ibuprofen, and cyclobenzaprine.  Meds ordered this encounter  Medications  . ibuprofen (ADVIL,MOTRIN) 600 MG tablet  . cyclobenzaprine (FLEXERIL) 10 MG tablet  . DISCONTD: hydrochlorothiazide (HYDRODIURIL) 25 MG tablet  . meloxicam (MOBIC) 7.5 MG tablet    Sig: Take 1 tablet (7.5 mg total) by mouth daily as needed for pain. With food    Dispense:  30 tablet    Refill:  3    Order Specific Question:   Supervising  Provider    Answer:   Tresa GarterPLOTNIKOV, ALEKSEI V [[id number]]  . hydrochlorothiazide (HYDRODIURIL) 25 MG tablet    Sig: Take 1 tablet (25 mg total) by mouth every morning.    Dispense:  30 tablet    Refill:  3    Order Specific Question:   Supervising Provider    Answer:   Tresa GarterPLOTNIKOV, ALEKSEI V [[id number]]    Follow-up: Return in about 3 months (around 10/16/2016), or HTN.  Alysia Pennaharlotte Dustee Bottenfield, NP

## 2016-07-16 NOTE — Progress Notes (Signed)
Pre visit review using our clinic review tool, if applicable. No additional management support is needed unless otherwise documented below in the visit note. 

## 2016-07-21 ENCOUNTER — Telehealth: Payer: Self-pay | Admitting: Nurse Practitioner

## 2016-07-21 NOTE — Telephone Encounter (Signed)
Please follow up on DEXA results.  Gave NP response.  Daughter requesting call back.

## 2016-07-21 NOTE — Telephone Encounter (Signed)
Patient called back and was given more detailed information about bone density results

## 2016-07-30 ENCOUNTER — Encounter: Payer: Commercial Managed Care - HMO | Admitting: Gastroenterology

## 2016-08-11 NOTE — Progress Notes (Deleted)
  Ebony Mayer D.O. [facility] [address] [place], [place] [address] Phone: [phone] Subjective:    I'm seeing this patient by the request  OZ:HYQMVHQIOOf:Charlotte Nche, NP    CC: Leg pain, iliotibial band syndrome f/u  NGE:[id number]:Subjective  Ebony Mayer is a 70 y.o. female coming in with complaint of left-sided leg pain. Patient was found to have patellofemoral arthritis as well as a distal iliotibial band syndrome. Patient was to try home exercises, icing protocol, oral anti-inflammatories. Patient states     Past Medical History:  Diagnosis Date  . Hypertension   . Patellofemoral arthritis of left knee 07/08/2016   No past surgical history on file. Social History   Social History  . Marital status: Single    Spouse name: N/A  . Number of children: N/A  . Years of education: N/A   Social History Main Topics  . Smoking status: Never Smoker  . Smokeless tobacco: Never Used  . Alcohol use No  . Drug use: No  . Sexual activity: Not Currently   Other Topics Concern  . Not on file   Social History Narrative  . No narrative on file   No Known Allergies Family History  Problem Relation Age of Onset  . Diabetes Mother   . Heart attack Mother   . Cancer Sister   . Heart attack Brother     Past medical history, social, surgical and family history all reviewed in electronic medical record.  No pertanent information unless stated regarding to the chief complaint.   Review of Systems: No headache, visual changes, nausea, vomiting, diarrhea, constipation, dizziness, abdominal pain, skin rash, fevers, chills, night sweats, weight loss, swollen lymph nodes, body aches, joint swelling, muscle aches, chest pain, shortness of breath, mood changes.   Objective  There were no vitals taken for this visit.  Systems examined below as of 08/11/16 General: NAD A&O x3 mood, affect normal  HEENT: Pupils equal, extraocular movements intact no nystagmus Respiratory: not short of  breath at rest or with speaking Cardiovascular: No lower extremity edema, non tender Skin: Warm dry intact with no signs of infection or rash on extremities or on axial skeleton. Abdomen: Soft nontender, no masses Neuro: Cranial nerves  intact, neurovascularly intact in all extremities with 2+ DTRs and 2+ pulses. Lymph: No lymphadenopathy appreciated today  Gait normal with good balance and coordination. Patient does have some mild weakness of the left hip with Trendelenburg. MSK:  Non tender with full range of motion and good stability and symmetric strength and tone of shoulders, elbows, wrist, hip, and ankles bilaterally.  Left knee exam shows the patient is a mild valgus deformity of the left knee compared to the right knee. Patient does have some mild crepitus with range of motion. Mild positive patellar grind test. Full strength and negative McMurray's. No significant instability noted today. Nontender on exam of the distal femur. Significant tightness of the iliotibial band compared to the contralateral side. Pain with Faber distally but not proximally. No pain with internal rotation of the hip.   Impression and Recommendations:     This case required medical decision making of moderate complexity.      Note: This dictation was prepared with Dragon dictation along with smaller phrase technology. Any transcriptional errors that result from this process are unintentional.

## 2016-08-12 ENCOUNTER — Ambulatory Visit: Payer: Commercial Managed Care - HMO | Admitting: Family Medicine

## 2016-08-13 ENCOUNTER — Encounter: Payer: Commercial Managed Care - HMO | Admitting: Gastroenterology

## 2016-09-13 NOTE — Progress Notes (Signed)
  Tawana ScaleZach Annmargaret Decaprio D.O. [facility] [address] [place], [place] [address] Phone: [phone] Subjective:      CC: Leg pain, iliotibial band syndrome f/u  HYQ:[id number]:Subjective  Ebony Nelly RoutDawkins is a 71 y.o. female coming in with complaint of left-sided leg pain. Was found to have patellofemoral syndrome as well as iliotibial band syndrome. Patient was to do conservative therapy including home exercises as well as hip abductor strengthening core stability. Patient states She is 100% better. Denies any pain whatsoever. Patient states that she can do all daily activities and is sleeping comfortable he. No grinding and no giving out on her. Doing the exercises regularly.     Past Medical History:  Diagnosis Date  . Hypertension   . Patellofemoral arthritis of left knee 07/08/2016   No past surgical history on file. Social History   Social History  . Marital status: Single    Spouse name: N/A  . Number of children: N/A  . Years of education: N/A   Social History Main Topics  . Smoking status: Never Smoker  . Smokeless tobacco: Never Used  . Alcohol use No  . Drug use: No  . Sexual activity: Not Currently   Other Topics Concern  . None   Social History Narrative  . None   No Known Allergies Family History  Problem Relation Age of Onset  . Diabetes Mother   . Heart attack Mother   . Cancer Sister   . Heart attack Brother     Past medical history, social, surgical and family history all reviewed in electronic medical record.  No pertanent information unless stated regarding to the chief complaint.   Review of Systems: No headache, visual changes, nausea, vomiting, diarrhea, constipation, dizziness, abdominal pain, skin rash, fevers, chills, night sweats, weight loss, swollen lymph nodes, body aches, joint swelling, muscle aches, chest pain, shortness of breath, mood changes.    Objective  Blood pressure 128/82, pulse 85, height 5\' 5"  (1.651 m), weight 167 lb (75.8 kg),  SpO2 98 %.  Systems examined below as of 09/14/16 General: NAD A&O x3 mood, affect normal  HEENT: Pupils equal, extraocular movements intact no nystagmus Respiratory: not short of breath at rest or with speaking Cardiovascular: No lower extremity edema, non tender Skin: Warm dry intact with no signs of infection or rash on extremities or on axial skeleton. Abdomen: Soft nontender, no masses Neuro: Cranial nerves  intact, neurovascularly intact in all extremities with 2+ DTRs and 2+ pulses. Lymph: No lymphadenopathy appreciated today  Gait normal with good balance and coordination.  MSK:  Non tender with full range of motion and good stability and symmetric strength and tone of shoulders, elbows, wrist, hip, and ankles bilaterally.  Left knee exam shows the patient is a mild valgus deformity of the left knee compared to the right knee. Patient does have some mild crepitus with range of motion. Mild positive patellar grind test. Full strength and negative McMurray's. No significant instability noted today. Nontender on exam of the distal femur. negative iliotibial band full range of motion.    Impression and Recommendations:     This case required medical decision making of moderate complexity.      Note: This dictation was prepared with Dragon dictation along with smaller phrase technology. Any transcriptional errors that result from this process are unintentional.

## 2016-09-14 ENCOUNTER — Encounter: Payer: Self-pay | Admitting: Family Medicine

## 2016-09-14 ENCOUNTER — Ambulatory Visit (INDEPENDENT_AMBULATORY_CARE_PROVIDER_SITE_OTHER): Payer: Commercial Managed Care - HMO | Admitting: Family Medicine

## 2016-09-14 DIAGNOSIS — M1712 Unilateral primary osteoarthritis, left knee: Secondary | ICD-10-CM

## 2016-09-14 NOTE — Assessment & Plan Note (Signed)
Significantly better at this time. No swelling. Encourage her to continue with conservative therapy follow-up as needed.

## 2016-10-13 ENCOUNTER — Other Ambulatory Visit: Payer: Self-pay | Admitting: Nurse Practitioner

## 2016-10-13 DIAGNOSIS — I10 Essential (primary) hypertension: Secondary | ICD-10-CM

## 2016-10-13 DIAGNOSIS — M7632 Iliotibial band syndrome, left leg: Secondary | ICD-10-CM

## 2016-10-15 ENCOUNTER — Other Ambulatory Visit: Payer: Self-pay | Admitting: *Deleted

## 2016-10-15 DIAGNOSIS — M1712 Unilateral primary osteoarthritis, left knee: Secondary | ICD-10-CM

## 2016-10-15 DIAGNOSIS — I10 Essential (primary) hypertension: Secondary | ICD-10-CM

## 2016-10-15 DIAGNOSIS — M7632 Iliotibial band syndrome, left leg: Secondary | ICD-10-CM

## 2016-10-15 MED ORDER — HYDROCHLOROTHIAZIDE 25 MG PO TABS: 25.0000 mg | ORAL_TABLET | Freq: Every morning | ORAL | 0 refills | Status: DC

## 2016-10-15 MED ORDER — MELOXICAM 7.5 MG PO TABS: 7.5000 mg | ORAL_TABLET | Freq: Every day | ORAL | 3 refills | Status: DC | PRN

## 2016-10-18 ENCOUNTER — Encounter: Payer: Self-pay | Admitting: Nurse Practitioner

## 2016-10-18 ENCOUNTER — Other Ambulatory Visit (INDEPENDENT_AMBULATORY_CARE_PROVIDER_SITE_OTHER): Payer: Medicare HMO

## 2016-10-18 ENCOUNTER — Ambulatory Visit (INDEPENDENT_AMBULATORY_CARE_PROVIDER_SITE_OTHER): Payer: Medicare HMO | Admitting: Nurse Practitioner

## 2016-10-18 VITALS — BP 118/80 | HR 97 | Temp 97.8°F | Wt 168.0 lb

## 2016-10-18 DIAGNOSIS — M7632 Iliotibial band syndrome, left leg: Secondary | ICD-10-CM | POA: Diagnosis not present

## 2016-10-18 DIAGNOSIS — I10 Essential (primary) hypertension: Secondary | ICD-10-CM

## 2016-10-18 DIAGNOSIS — M1712 Unilateral primary osteoarthritis, left knee: Secondary | ICD-10-CM | POA: Diagnosis not present

## 2016-10-18 LAB — BASIC METABOLIC PANEL
BUN: 13 mg/dL (ref 6–23)
CALCIUM: 9.4 mg/dL (ref 8.4–10.5)
CHLORIDE: 104 meq/L (ref 96–112)
CO2: 31 meq/L (ref 19–32)
CREATININE: 0.98 mg/dL (ref 0.40–1.20)
GFR: 72.25 mL/min (ref >60.00)
GLUCOSE: 110 mg/dL — AB (ref 70–99)
Potassium: 4.6 mEq/L (ref 3.5–5.1)
SODIUM: 140 meq/L (ref 135–145)

## 2016-10-18 MED ORDER — AMLODIPINE BESYLATE 5 MG PO TABS: 5.0000 mg | ORAL_TABLET | Freq: Every day | ORAL | 1 refills | Status: DC

## 2016-10-18 MED ORDER — GABAPENTIN 300 MG PO CAPS: 300.0000 mg | ORAL_CAPSULE | Freq: Every day | ORAL | 1 refills | Status: DC

## 2016-10-18 MED ORDER — DICLOFENAC SODIUM 1 % TD GEL: 2.0000 g | Freq: Three times a day (TID) | TRANSDERMAL | 1 refills | Status: DC | PRN

## 2016-10-18 MED ORDER — HYDROCHLOROTHIAZIDE 25 MG PO TABS: 25.0000 mg | ORAL_TABLET | Freq: Every morning | ORAL | 1 refills | Status: DC

## 2016-10-18 NOTE — Patient Instructions (Signed)
Please call pharmacy about amlodipine, gabapentin and HCTZ.   go to basement for BMP lab.  You will be called to schedule mammogram.  Left us know if you want cologaurd ordered.

## 2016-10-18 NOTE — Progress Notes (Signed)
Subjective:  Patient ID: Ebony Mayer, female    DOB: [birth year]  Age: 71 y.o. MRN: [id number]  CC: Follow-up (needs refills on amlodipine and gabapentin)   HPI  denies any cute complains.    HTN: Complain with medications.  No CP or palpitations, no SOB or edema or headache. needs refill  Outpatient Medications Prior to Visit  Medication Sig Dispense Refill  . cyclobenzaprine (FLEXERIL) 10 MG tablet     . amLODipine (NORVASC) 5 MG tablet TAKE ONE TABLET BY MOUTH AT BEDTIME 90 tablet 0  . gabapentin (NEURONTIN) 300 MG capsule TAKE ONE CAPSULE BY MOUTH AT BEDTIME 90 capsule 0  . hydrochlorothiazide (HYDRODIURIL) 25 MG tablet Take 1 tablet (25 mg total) by mouth every morning. 90 tablet 0  . ibuprofen (ADVIL,MOTRIN) 600 MG tablet     . meloxicam (MOBIC) 7.5 MG tablet Take 1 tablet (7.5 mg total) by mouth daily as needed for pain. With food 30 tablet 3   No facility-administered medications prior to visit.     ROS See HPI  Objective:  BP 118/80   Pulse 97   Temp 97.8 F (36.6 C)   Wt 168 lb (76.2 kg)   SpO2 98%   BMI 27.96 kg/m   BP Readings from Last 3 Encounters:  10/18/16 118/80  09/14/16 128/82  07/16/16 (!) 132/92    Wt Readings from Last 3 Encounters:  10/18/16 168 lb (76.2 kg)  09/14/16 167 lb (75.8 kg)  07/16/16 164 lb (74.4 kg)    Physical Exam  Constitutional: She is oriented to person, place, and time. No distress.  HENT:  Right Ear: External ear normal.  Left Ear: External ear normal.  Nose: Nose normal.  Mouth/Throat: No oropharyngeal exudate.  Eyes: No scleral icterus.  Neck: Normal range of motion. Neck supple.  Cardiovascular: Normal rate, regular rhythm and normal heart sounds.   Pulmonary/Chest: Effort normal and breath sounds normal. No respiratory distress.  Abdominal: Soft. She exhibits no distension.  Musculoskeletal: Normal range of motion. She exhibits no edema.  Lymphadenopathy:    She has no cervical adenopathy.    Neurological: She is alert and oriented to person, place, and time.  Skin: Skin is warm and dry.  Psychiatric: She has a normal mood and affect. Her behavior is normal.    Lab Results  Component Value Date   WBC 3.9 (L) 04/27/2016   HGB 12.9 04/27/2016   HCT 39.3 04/27/2016   PLT 136 (L) 04/27/2016   GLUCOSE 110 (H) 10/18/2016   CHOL 177 05/31/2016   TRIG 137.0 05/31/2016   HDL 42.50 05/31/2016   LDLCALC 107 (H) 05/31/2016   ALT 13 (L) 04/27/2016   AST 18 04/27/2016   NA 140 10/18/2016   K 4.6 10/18/2016   CL 104 10/18/2016   CREATININE 0.98 10/18/2016   BUN 13 10/18/2016   CO2 31 10/18/2016   TSH 1.02 05/31/2016   HGBA1C 5.3 05/31/2016    Dg Bone Density  Result Date: 07/18/2016 Date of study: 07/16/16 Exam: DUAL X-RAY ABSORPTIOMETRY (DXA) FOR BONE MINERAL DENSITY (BMD) Instrument: [employer] Therapist, art Provider: PCP Indication: follow up for low BMD Comparison: none (please note that it is not possible to compare data from different instruments) Clinical data: Pt is a 71 y.o. female with no previous history of fracture. On calcium and vitamin D. Results:  Lumbar spine (L1-L4) Femoral neck (FN) 33% distal radius T-score 0.4 RFN:-1.9 LFN:-1.8 n/a Change in BMD from previous DXA test (%) n/a n/a  n/a (*) statistically significant Assessment: the BMD is low according to the [facility] classification for osteoporosis (see below). Fracture risk: moderate FRAX score: 10 year major osteoporotic risk: 5.1%. 10 year hip fracture risk: 0.9%. These are under the thresholds for treatment of 20% and 3%, respectively. Comments: the technical quality of the study is good. Evaluation for secondary causes should be considered if clinically indicated. Recommend optimizing calcium (1200 mg/day) and vitamin D (800 IU/day) intake. Followup: Repeat BMD is appropriate after 2 years or after 1-2 years if starting treatment. WHO criteria for diagnosis of osteoporosis in postmenopausal women and in men 84 y/o  or older: - normal: T-score -1.0 to + 1.0 - osteopenia/low bone density: T-score between -2.5 and -1.0 - osteoporosis: T-score below -2.5 - severe osteoporosis: T-score below -2.5 with history of fragility fracture Note: although not part of the WHO classification, the presence of a fragility fracture, regardless of the T-score, should be considered diagnostic of osteoporosis, provided other causes for the fracture have been excluded. Treatment: The National Osteoporosis Foundation recommends that treatment be considered in postmenopausal women and men age 72 or older with: 1. Hip or vertebral (clinical or morphometric) fracture 2. T-score of - 2.5 or lower at the spine or hip 3. 10-year fracture probability by FRAX of at least 20% for a major osteoporotic fracture and 3% for a hip fracture Roxy Manns MD    Assessment & Plan:   Raylene was seen today for follow-up.  Diagnoses and all orders for this visit:  Essential hypertension, benign -     Basic metabolic panel; Future -     amLODipine (NORVASC) 5 MG tablet; Take 1 tablet (5 mg total) by mouth at bedtime. -     hydrochlorothiazide (HYDRODIURIL) 25 MG tablet; Take 1 tablet (25 mg total) by mouth every morning.  Patellofemoral arthritis of left knee -     diclofenac sodium (VOLTAREN) 1 % GEL; Apply 2 g topically 3 (three) times daily as needed.  It band syndrome, left -     gabapentin (NEURONTIN) 300 MG capsule; Take 1 capsule (300 mg total) by mouth at bedtime.   I have discontinued Ms. Levene's ibuprofen and meloxicam. I have also changed her amLODipine and gabapentin. Additionally, I am having her start on diclofenac sodium. Lastly, I am having her maintain her cyclobenzaprine and hydrochlorothiazide.  Meds ordered this encounter  Medications  . diclofenac sodium (VOLTAREN) 1 % GEL    Sig: Apply 2 g topically 3 (three) times daily as needed.    Dispense:  100 g    Refill:  1    Order Specific Question:   Supervising Provider     Answer:   Tresa Garter [[id number]]  . amLODipine (NORVASC) 5 MG tablet    Sig: Take 1 tablet (5 mg total) by mouth at bedtime.    Dispense:  90 tablet    Refill:  1    Please consider 90 day supplies to promote better adherence    Order Specific Question:   Supervising Provider    Answer:   Tresa Garter [[id number]]  . gabapentin (NEURONTIN) 300 MG capsule    Sig: Take 1 capsule (300 mg total) by mouth at bedtime.    Dispense:  90 capsule    Refill:  1    Please consider 90 day supplies to promote better adherence    Order Specific Question:   Supervising Provider    Answer:   Tresa Garter [[id number]]  . hydrochlorothiazide (  HYDRODIURIL) 25 MG tablet    Sig: Take 1 tablet (25 mg total) by mouth every morning.    Dispense:  90 tablet    Refill:  1    Order Specific Question:   Supervising Provider    Answer:   Tresa GarterPLOTNIKOV, ALEKSEI V [[id number]]    Follow-up: Return in about 6 months (around 04/17/2017) for HTN.  Alysia Pennaharlotte Darrick Greenlaw, NP

## 2016-10-18 NOTE — Progress Notes (Signed)
Pre visit review using our clinic review tool, if applicable. No additional management support is needed unless otherwise documented below in the visit note. 

## 2016-10-19 ENCOUNTER — Telehealth: Payer: Self-pay | Admitting: Nurse Practitioner

## 2016-10-19 NOTE — Assessment & Plan Note (Signed)
Controlled with amlodipine. 

## 2016-10-19 NOTE — Telephone Encounter (Signed)
Patient called back.  Gave Charlotte's response on labs.  °

## 2016-10-27 ENCOUNTER — Telehealth: Payer: Self-pay | Admitting: Nurse Practitioner

## 2016-10-27 NOTE — Telephone Encounter (Signed)
Pt is aware Ebony Mayer is out of the office today. Please advise

## 2016-10-27 NOTE — Telephone Encounter (Signed)
Coricidin or robitussim DM or flonase or saline nasal spray.

## 2016-10-27 NOTE — Telephone Encounter (Signed)
Pt called and said that she has a cold and needs to know what OTC meds are meds since she is on BP meds?

## 2016-10-28 NOTE — Telephone Encounter (Signed)
Patient called back. I informed her of Charlottes notes.

## 2016-10-28 NOTE — Telephone Encounter (Signed)
left vm for pt to return call.

## 2016-11-19 ENCOUNTER — Other Ambulatory Visit: Payer: Self-pay | Admitting: *Deleted

## 2016-11-19 DIAGNOSIS — I10 Essential (primary) hypertension: Secondary | ICD-10-CM

## 2016-11-19 MED ORDER — HYDROCHLOROTHIAZIDE 25 MG PO TABS: 25.0000 mg | ORAL_TABLET | Freq: Every morning | ORAL | 1 refills | Status: DC

## 2017-01-17 ENCOUNTER — Telehealth: Payer: Self-pay | Admitting: *Deleted

## 2017-01-17 NOTE — Telephone Encounter (Signed)
Left msg on triage requesting refill on her gabapentin...Raechel Chute/lmb

## 2017-01-17 NOTE — Telephone Encounter (Signed)
90tabs were sent 10/18/2016 with 1 refill. She should not need refill at this time

## 2017-01-18 NOTE — Telephone Encounter (Signed)
Notified pt w/charlotte response. Pt state she wasn't aware she had a refill will contact her pharmacy...Raechel Chute/lmb

## 2017-07-15 ENCOUNTER — Other Ambulatory Visit: Payer: Self-pay | Admitting: Nurse Practitioner

## 2017-07-15 DIAGNOSIS — I10 Essential (primary) hypertension: Secondary | ICD-10-CM

## 2017-07-15 DIAGNOSIS — M7632 Iliotibial band syndrome, left leg: Secondary | ICD-10-CM

## 2017-07-15 NOTE — Telephone Encounter (Signed)
90 days supply sent to pharmacy. Please help call pt and offer an appt before she runs out of her med---per charlotte.

## 2017-07-15 NOTE — Telephone Encounter (Signed)
Spoke with pt and let her know

## 2017-10-12 ENCOUNTER — Other Ambulatory Visit: Payer: Self-pay | Admitting: Nurse Practitioner

## 2017-10-12 DIAGNOSIS — I10 Essential (primary) hypertension: Secondary | ICD-10-CM

## 2017-10-12 DIAGNOSIS — M7632 Iliotibial band syndrome, left leg: Secondary | ICD-10-CM

## 2017-10-12 NOTE — Telephone Encounter (Signed)
Left an vm offer an appt with Nche. Last refills we sent in 07/2017 (charlotte wants to see her for more refills).

## 2017-10-19 ENCOUNTER — Encounter: Payer: Self-pay | Admitting: Nurse Practitioner

## 2017-10-19 ENCOUNTER — Ambulatory Visit (INDEPENDENT_AMBULATORY_CARE_PROVIDER_SITE_OTHER): Payer: Medicare HMO | Admitting: Nurse Practitioner

## 2017-10-19 VITALS — BP 136/80 | HR 93 | Temp 97.7°F | Ht 65.0 in | Wt 163.0 lb

## 2017-10-19 DIAGNOSIS — I10 Essential (primary) hypertension: Secondary | ICD-10-CM

## 2017-10-19 DIAGNOSIS — R011 Cardiac murmur, unspecified: Secondary | ICD-10-CM | POA: Diagnosis not present

## 2017-10-19 DIAGNOSIS — R739 Hyperglycemia, unspecified: Secondary | ICD-10-CM

## 2017-10-19 DIAGNOSIS — M79605 Pain in left leg: Secondary | ICD-10-CM | POA: Diagnosis not present

## 2017-10-19 DIAGNOSIS — M7632 Iliotibial band syndrome, left leg: Secondary | ICD-10-CM | POA: Diagnosis not present

## 2017-10-19 DIAGNOSIS — E782 Mixed hyperlipidemia: Secondary | ICD-10-CM | POA: Diagnosis not present

## 2017-10-19 DIAGNOSIS — Z1231 Encounter for screening mammogram for malignant neoplasm of breast: Secondary | ICD-10-CM | POA: Diagnosis not present

## 2017-10-19 DIAGNOSIS — M1712 Unilateral primary osteoarthritis, left knee: Secondary | ICD-10-CM | POA: Diagnosis not present

## 2017-10-19 HISTORY — DX: Mixed hyperlipidemia: E78.2

## 2017-10-19 LAB — LIPID PANEL
Cholesterol: 157 mg/dL (ref 0–200)
HDL: 37.6 mg/dL — ABNORMAL LOW (ref >39.00)
LDL Cholesterol: 87 mg/dL (ref 0–99)
NONHDL: 119.51
Total CHOL/HDL Ratio: 4
Triglycerides: 162 mg/dL — ABNORMAL HIGH (ref 0.0–149.0)
VLDL: 32.4 mg/dL (ref 0.0–40.0)

## 2017-10-19 LAB — BASIC METABOLIC PANEL
BUN: 13 mg/dL (ref 6–23)
CHLORIDE: 100 meq/L (ref 96–112)
CO2: 34 meq/L — AB (ref 19–32)
CREATININE: 1.03 mg/dL (ref 0.40–1.20)
Calcium: 10 mg/dL (ref 8.4–10.5)
GFR: 68.02 mL/min (ref >60.00)
GLUCOSE: 111 mg/dL — AB (ref 70–99)
POTASSIUM: 4.8 meq/L (ref 3.5–5.1)
Sodium: 140 mEq/L (ref 135–145)

## 2017-10-19 LAB — HEPATIC FUNCTION PANEL
ALBUMIN: 4.3 g/dL (ref 3.5–5.2)
ALT: 10 U/L (ref 0–35)
AST: 13 U/L (ref 0–37)
Alkaline Phosphatase: 56 U/L (ref 39–117)
Bilirubin, Direct: 0.1 mg/dL (ref 0.0–0.3)
TOTAL PROTEIN: 7.2 g/dL (ref 6.0–8.3)
Total Bilirubin: 0.7 mg/dL (ref 0.2–1.2)

## 2017-10-19 LAB — HEMOGLOBIN A1C: HEMOGLOBIN A1C: 5.4 % (ref 4.6–6.5)

## 2017-10-19 MED ORDER — GABAPENTIN 300 MG PO CAPS: 300.0000 mg | ORAL_CAPSULE | Freq: Every day | ORAL | 3 refills | Status: DC

## 2017-10-19 MED ORDER — AMLODIPINE BESYLATE 5 MG PO TABS
5.0000 mg | ORAL_TABLET | Freq: Every day | ORAL | 3 refills | Status: DC
Start: 2017-10-19 — End: 2018-11-16

## 2017-10-19 MED ORDER — HYDROCHLOROTHIAZIDE 25 MG PO TABS: 25.0000 mg | ORAL_TABLET | Freq: Every morning | ORAL | 3 refills | Status: DC

## 2017-10-19 NOTE — Progress Notes (Signed)
Subjective:  Patient ID: Ebony Mayer, female    DOB: 1945-10-19  Age: 72 y.o. MRN: [id number]  CC: Follow-up (medications refills. decline tdap/calcium consult?)  HPI  accompanied by daughter.  HTN: Controlled with amlodipine and HCTZ. BP Readings from Last 3 Encounters:  10/19/17 136/80  10/18/16 118/80  09/14/16 128/82   Left leg pain: She reports intermittent leg pain with standing. " pain is not as bad as before" She has stopped home exercise as recommended by Dr. Katrinka Blazing. Pain does not interfere with ADLs (cooking, cleaning, bathing) She states she can not return to work but to intermittent pain. She will like another evaluation to determine permanent disability?  Outpatient Medications Prior to Visit  Medication Sig Dispense Refill  . Calcium Carb-Cholecalciferol (CALCIUM 600+D) 600-800 MG-UNIT TABS Take by mouth. otc    . cyclobenzaprine (FLEXERIL) 10 MG tablet     . diclofenac sodium (VOLTAREN) 1 % GEL Apply 2 g topically 3 (three) times daily as needed. 100 g 1  . amLODipine (NORVASC) 5 MG tablet Take 1 tablet (5 mg total) by mouth at bedtime. Need Office Visit for further refill 30 tablet 0  . gabapentin (NEURONTIN) 300 MG capsule TAKE 1 CAPSULE BY MOUTH AT BEDTIME 90 capsule 0  . hydrochlorothiazide (HYDRODIURIL) 25 MG tablet Take 1 tablet (25 mg total) by mouth every morning. 90 tablet 1   No facility-administered medications prior to visit.     ROS Review of Systems  Constitutional: Negative for malaise/fatigue.  Respiratory: Negative for cough and shortness of breath.   Cardiovascular: Negative for chest pain, palpitations, orthopnea and leg swelling.  Musculoskeletal: Positive for joint pain. Negative for falls and myalgias.  Skin: Negative.   Neurological: Negative for dizziness, sensory change, weakness and headaches.  Endo/Heme/Allergies: Negative for polydipsia.  Psychiatric/Behavioral: Negative for depression. The patient is not nervous/anxious and  does not have insomnia.     Objective:  BP 136/80   Pulse 93   Temp 97.7 F (36.5 C)   Ht 5\' 5"  (1.651 m)   Wt 163 lb (73.9 kg)   SpO2 100%   BMI 27.12 kg/m   BP Readings from Last 3 Encounters:  10/19/17 136/80  10/18/16 118/80  09/14/16 128/82    Wt Readings from Last 3 Encounters:  10/19/17 163 lb (73.9 kg)  10/18/16 168 lb (76.2 kg)  09/14/16 167 lb (75.8 kg)    Physical Exam  Constitutional: She is oriented to person, place, and time.  Neck: Normal range of motion. Neck supple.  Cardiovascular: Normal rate, regular rhythm and intact distal pulses. Exam reveals no gallop and no friction rub.  Murmur heard. Pulmonary/Chest: Effort normal and breath sounds normal.  Musculoskeletal: She exhibits no edema or tenderness.  Neurological: She is alert and oriented to person, place, and time.  Skin: Skin is warm and dry.  Psychiatric: She has a normal mood and affect. Her behavior is normal.  Vitals reviewed.   Lab Results  Component Value Date   WBC 3.9 (L) 04/27/2016   HGB 12.9 04/27/2016   HCT 39.3 04/27/2016   PLT 136 (L) 04/27/2016   GLUCOSE 111 (H) 10/19/2017   CHOL 157 10/19/2017   TRIG 162.0 (H) 10/19/2017   HDL 37.60 (L) 10/19/2017   LDLCALC 87 10/19/2017   ALT 10 10/19/2017   AST 13 10/19/2017   NA 140 10/19/2017   K 4.8 10/19/2017   CL 100 10/19/2017   CREATININE 1.03 10/19/2017   BUN 13 10/19/2017   CO2 34 (  H) 10/19/2017   TSH 1.02 05/31/2016   HGBA1C 5.4 10/19/2017    Dg Bone Density  Result Date: 07/18/2016 Date of study: 07/16/16 Exam: DUAL X-RAY ABSORPTIOMETRY (DXA) FOR BONE MINERAL DENSITY (BMD) Instrument: [employer] Therapist, [facility] [employer] Requesting Provider: PCP Indication: follow up for low BMD Comparison: none (please note that it is not possible to compare data from different instruments) Clinical data: Pt is a 72 y.o. female with no previous history of fracture. On calcium and vitamin D. Results:  Lumbar spine (L1-L4) Femoral neck (FN) 33% distal  radius T-score 0.4 RFN:-1.9 LFN:-1.8 n/a Change in BMD from previous DXA test (%) n/a n/a n/a (*) statistically significant Assessment: the BMD is low according to the [facility] classification for osteoporosis (see below). Fracture risk: moderate FRAX score: 10 year major osteoporotic risk: 5.1%. 10 year hip fracture risk: 0.9%. These are under the thresholds for treatment of 20% and 3%, respectively. Comments: the technical quality of the study is good. Evaluation for secondary causes should be considered if clinically indicated. Recommend optimizing calcium (1200 mg/day) and vitamin D (800 IU/day) intake. Followup: Repeat BMD is appropriate after 2 years or after 1-2 years if starting treatment. WHO criteria for diagnosis of osteoporosis in postmenopausal women and in men 750 y/o or older: - normal: T-score -1.0 to + 1.0 - osteopenia/low bone density: T-score between -2.5 and -1.0 - osteoporosis: T-score below -2.5 - severe osteoporosis: T-score below -2.5 with history of fragility fracture Note: although not part of the WHO classification, the presence of a fragility fracture, regardless of the T-score, should be considered diagnostic of osteoporosis, provided other causes for the fracture have been excluded. Treatment: The National Osteoporosis Foundation recommends that treatment be considered in postmenopausal women and men age 71 or older with: 1. Hip or vertebral (clinical or morphometric) fracture 2. T-score of - 2.5 or lower at the spine or hip 3. 10-year fracture probability by FRAX of at least 20% for a major osteoporotic fracture and 3% for a hip fracture Roxy MannsMarne Tower MD    Assessment & Plan:   Ebony Mayer was seen today for follow-up.  Diagnoses and all orders for this visit:  Essential hypertension, benign -     Basic metabolic panel -     Hepatic function panel -     amLODipine (NORVASC) 5 MG tablet; Take 1 tablet (5 mg total) by mouth at bedtime. Need Office Visit for further refill -      hydrochlorothiazide (HYDRODIURIL) 25 MG tablet; Take 1 tablet (25 mg total) by mouth every morning.  Heart murmur on physical examination -     ECHOCARDIOGRAM COMPLETE; Future  Patellofemoral arthritis of left knee -     AMB referral to orthopedics  Pain in lateral left lower extremity -     AMB referral to orthopedics -     gabapentin (NEURONTIN) 300 MG capsule; Take 1 capsule (300 mg total) by mouth at bedtime.  Mixed hyperlipidemia -     Hepatic function panel -     Lipid panel  Hyperglycemia -     Hemoglobin A1c  Breast cancer screening by mammogram -     MM SCREENING BREAST TOMO BILATERAL; Future  It band syndrome, left -     gabapentin (NEURONTIN) 300 MG capsule; Take 1 capsule (300 mg total) by mouth at bedtime.   I have changed Ebony Mayer's gabapentin. I am also having her maintain her cyclobenzaprine, diclofenac sodium, Calcium Carb-Cholecalciferol, amLODipine, and hydrochlorothiazide.  Meds ordered this encounter  Medications  .  amLODipine (NORVASC) 5 MG tablet    Sig: Take 1 tablet (5 mg total) by mouth at bedtime. Need Office Visit for further refill    Dispense:  90 tablet    Refill:  3    Order Specific Question:   Supervising Provider    Answer:   Dianne Dun [[id number]]  . gabapentin (NEURONTIN) 300 MG capsule    Sig: Take 1 capsule (300 mg total) by mouth at bedtime.    Dispense:  90 capsule    Refill:  3    Order Specific Question:   Supervising Provider    Answer:   Dianne Dun [[id number]]  . hydrochlorothiazide (HYDRODIURIL) 25 MG tablet    Sig: Take 1 tablet (25 mg total) by mouth every morning.    Dispense:  90 tablet    Refill:  3    Order Specific Question:   Supervising Provider    Answer:   Dianne Dun [[id number]]    Follow-up: Return in about 6 months (around 04/18/2018) for HTN.  [name], NP

## 2017-10-19 NOTE — Patient Instructions (Addendum)
You will be contacted to schedule appt for mammogram.  Complete cologuard test and return as instructed.  You will be contacted to schedule appt with ortho.  Persistent mild elevation in glucose. Maintain low carb diet and regular exercise. Normal hepatic, and renal function. Stable lipid panel. Med refill sent.

## 2017-10-24 ENCOUNTER — Ambulatory Visit (INDEPENDENT_AMBULATORY_CARE_PROVIDER_SITE_OTHER): Payer: Self-pay | Admitting: Orthopaedic Surgery

## 2017-10-27 ENCOUNTER — Other Ambulatory Visit: Payer: Self-pay

## 2017-10-27 ENCOUNTER — Ambulatory Visit (HOSPITAL_COMMUNITY): Payer: Medicare HMO | Attending: Internal Medicine

## 2017-10-27 DIAGNOSIS — R011 Cardiac murmur, unspecified: Secondary | ICD-10-CM | POA: Diagnosis not present

## 2017-10-27 DIAGNOSIS — I10 Essential (primary) hypertension: Secondary | ICD-10-CM | POA: Insufficient documentation

## 2017-10-27 DIAGNOSIS — E785 Hyperlipidemia, unspecified: Secondary | ICD-10-CM | POA: Insufficient documentation

## 2017-10-31 ENCOUNTER — Ambulatory Visit (INDEPENDENT_AMBULATORY_CARE_PROVIDER_SITE_OTHER): Payer: Medicare HMO

## 2017-10-31 ENCOUNTER — Ambulatory Visit (INDEPENDENT_AMBULATORY_CARE_PROVIDER_SITE_OTHER): Payer: Medicare HMO | Admitting: Orthopaedic Surgery

## 2017-10-31 ENCOUNTER — Encounter (INDEPENDENT_AMBULATORY_CARE_PROVIDER_SITE_OTHER): Payer: Self-pay | Admitting: Orthopaedic Surgery

## 2017-10-31 VITALS — BP 143/70 | HR 99 | Resp 16 | Ht 63.0 in | Wt 163.0 lb

## 2017-10-31 DIAGNOSIS — G8929 Other chronic pain: Secondary | ICD-10-CM

## 2017-10-31 DIAGNOSIS — M25562 Pain in left knee: Secondary | ICD-10-CM | POA: Diagnosis not present

## 2017-10-31 NOTE — Progress Notes (Signed)
Office Visit Note   Patient: Ebony Mayer           Date of Birth: 1946/03/09           MRN: [id number] Visit Date: 10/31/2017              Requested by: Anne Ng, NP [address] [place], [place] [address] PCP: Anne Ng, NP   Assessment & Plan: Visit Diagnoses:  1. Chronic pain of left knee     Plan: Films demonstrate mild to moderate osteoarthritis left knee. Long discussion with Ebony Mayer and her daughter regarding the diagnosis and different treatment options. She like to try over-the-counter medicines. We'll try a pullover support and brace that would help with when she is up on her feet working. All questions were answered. We'll be happy to see her back any time in the future. Would consider cortisone injection and Visco supplementation. Follow-Up Instructions: Return if symptoms worsen or fail to improve.   Orders:  Orders Placed This Encounter  Procedures  . XR KNEE 3 VIEW LEFT   No orders of the defined types were placed in this encounter.     Procedures: No procedures performed   Clinical Data: No additional findings.   Subjective: Chief Complaint  Patient presents with  . Left Knee - Pain, Edema    Ebony Mayer is a 72 y o here with chronic left leg/left knee pain x 1 year. She has seen her PCP, sports med dr.gave her   EbonyMayer is accompanied by her daughter and  here for evaluation of left knee pain. She experienced onset of pain when she was on her feet working at [employer] in  2017. She denied a specific injury or trauma. She's had some pain rather diffusely about the knee without localization. The knee has been sore, achy and stiff. She's been somewhat hesitant to take any medicines. She has been seen by her primary care physicians in the past with a diagnosis of IT band pain and patellofemoral arthritis  HPI  Review of Systems  Constitutional: Negative for chills, fatigue and fever.  HENT: Negative for hearing  loss and tinnitus.   Eyes: Negative for itching.  Respiratory: Negative for chest tightness and shortness of breath.   Cardiovascular: Negative for chest pain, palpitations and leg swelling.  Gastrointestinal: Negative for blood in stool, constipation and diarrhea.  Endocrine: Negative for polyuria.  Genitourinary: Negative for dysuria.  Musculoskeletal: Negative for back pain, joint swelling, neck pain and neck stiffness.  Allergic/Immunologic: Negative for immunocompromised state.  Neurological: Negative for dizziness, numbness and headaches.  Hematological: Does not bruise/bleed easily.  Psychiatric/Behavioral: Negative for sleep disturbance. The patient is not nervous/anxious.      Objective: Vital Signs: BP (!) 143/70   Pulse 99   Resp 16   Ht 5\' 3"  (1.6 m)   Wt 163 lb (73.9 kg)   BMI 28.87 kg/m   Physical Exam  Ortho Exam awake alert and oriented 3. Just a little confused. Left knee without effusion. No increased warmth. Pain and a long medial lateral joint and the patellofemoral region with range of motion. Full extension. Flexes over 105. No instability. No popliteal mass. No calf pain. Neurovascular exam intact distally. Straight leg raise negative. Painless range of motion left hip  Specialty Comments:  No specialty comments available.  Imaging: Xr Knee 3 View Left  Result Date: 10/31/2017 Films the left knee are obtained in several projections standing. There are some arthritic  changes in all 3 compartments. Peripheral osteophytes at the patellofemoral joint but joint space is well-maintained. On the AP view there is slight decrease in the medial joint space with small peripheral osteophytes. Very minimal subchondral sclerosis. No ectopic calcification. Findings consistent with mild to moderate osteoarthritis    PMFS History: Patient Active Problem List   Diagnosis Date Noted  . Mixed hyperlipidemia 10/19/2017  . Hyperglycemia 10/19/2017  . Patellofemoral  arthritis of left knee 07/08/2016  . It band syndrome, left 06/15/2016  . Essential hypertension, benign 05/31/2016  . Leg pain, lateral 05/31/2016  . Varicose veins of left lower extremity with pain 05/31/2016  . Onychomycosis 10/10/2013  . Porokeratosis 10/10/2013   Past Medical History:  Diagnosis Date  . Hypertension   . Mixed hyperlipidemia 10/19/2017  . Patellofemoral arthritis of left knee 07/08/2016    Family History  Problem Relation Age of Onset  . Diabetes Mother   . Heart attack Mother   . Cancer Sister   . Heart attack Brother     History reviewed. No pertinent surgical history. Social History   Occupational History  . Not on file  Tobacco Use  . Smoking status: Never Smoker  . Smokeless tobacco: Never Used  Substance and Sexual Activity  . Alcohol use: No  . Drug use: No  . Sexual activity: Not Currently

## 2017-11-01 ENCOUNTER — Other Ambulatory Visit (INDEPENDENT_AMBULATORY_CARE_PROVIDER_SITE_OTHER): Payer: Self-pay

## 2017-11-01 ENCOUNTER — Other Ambulatory Visit: Payer: Self-pay | Admitting: Nurse Practitioner

## 2017-11-01 DIAGNOSIS — R011 Cardiac murmur, unspecified: Secondary | ICD-10-CM

## 2017-11-01 DIAGNOSIS — R931 Abnormal findings on diagnostic imaging of heart and coronary circulation: Secondary | ICD-10-CM

## 2017-11-02 ENCOUNTER — Encounter: Payer: Self-pay | Admitting: Cardiology

## 2017-11-02 ENCOUNTER — Ambulatory Visit (INDEPENDENT_AMBULATORY_CARE_PROVIDER_SITE_OTHER): Payer: Medicare HMO | Admitting: Cardiology

## 2017-11-02 VITALS — BP 134/74 | HR 105 | Ht 65.0 in | Wt 161.8 lb

## 2017-11-02 DIAGNOSIS — Q2112 Patent foramen ovale: Secondary | ICD-10-CM

## 2017-11-02 DIAGNOSIS — R011 Cardiac murmur, unspecified: Secondary | ICD-10-CM

## 2017-11-02 DIAGNOSIS — I10 Essential (primary) hypertension: Secondary | ICD-10-CM | POA: Diagnosis not present

## 2017-11-02 DIAGNOSIS — Q211 Atrial septal defect: Secondary | ICD-10-CM

## 2017-11-02 MED ORDER — HYDROCHLOROTHIAZIDE 25 MG PO TABS: 25.0000 mg | ORAL_TABLET | Freq: Every morning | ORAL | 3 refills | Status: DC

## 2017-11-02 NOTE — Patient Instructions (Signed)
Medication Instructions:  Your physician recommends that you continue on your current medications as directed. Please refer to the Current Medication list given to you today.  If you need a refill on your cardiac medications, please contact your pharmacy first.  Labwork: None ordered   Testing/Procedures: Your physician has requested that you have a limited bubble echocardiogram. Echocardiography is a painless test that uses sound waves to create images of your heart. It provides your doctor with information about the size and shape of your heart and how well your heart's chambers and valves are working. This procedure takes approximately one hour. There are no restrictions for this procedure.  Follow-Up: Your physician wants you to follow-up as needed with Dr. Mayford Knifeurner.   Any Other Special Instructions Will Be Listed Below (If Applicable).   Thank you [facility] choosing [facility]    Lyda PeroneRena Naiya Corral, RN  [phone]  If you need a refill on your cardiac medications before your next appointment, please call your pharmacy.

## 2017-11-02 NOTE — Progress Notes (Signed)
Cardiology Office Note    Date:  11/02/2017   ID:  Ebony Mayer, DOB 1946/01/05, MRN [id number]  PCP:  Anne NgNche, Charlotte Lum, NP  Cardiologist:  Armanda Magicraci Dariyah Garduno, MD   Chief Complaint  Patient presents with  . Heart Murmur    History of Present Illness:  Ebony Mayer is a 72 y.o. female who is being seen today for the evaluation of heart murmur and abnormal echo at the request of Nche, Bonna Gainsharlotte Lum, NP.  This is a 72yo female with a history of HTN and hyperlipidemia who was noted to have a heart murmur on exam and a 2D echo was ordered which showed normal LVF with EF 60-65% and hypermobile IAS with possible PFO.  She is now referred for further evaluation.  She is here today for followup and is doing well.  She denies any chest pain or pressure, SOB, DOE, PND, orthopnea, LE edema, dizziness, palpitations or syncope. She is compliant with her meds and is tolerating meds with no SE.    Past Medical History:  Diagnosis Date  . Hypertension   . Mixed hyperlipidemia 10/19/2017  . Patellofemoral arthritis of left knee 07/08/2016    History reviewed. No pertinent surgical history.  Current Medications: Current Meds  Medication Sig  . amLODipine (NORVASC) 5 MG tablet Take 1 tablet (5 mg total) by mouth at bedtime. Need Office Visit for further refill  . diclofenac sodium (VOLTAREN) 1 % GEL Apply 2 g topically 3 (three) times daily as needed.  . gabapentin (NEURONTIN) 300 MG capsule Take 1 capsule (300 mg total) by mouth at bedtime.  . hydrochlorothiazide (HYDRODIURIL) 25 MG tablet Take 1 tablet (25 mg total) by mouth every morning.  . [DISCONTINUED] hydrochlorothiazide (HYDRODIURIL) 25 MG tablet Take 1 tablet (25 mg total) by mouth every morning.    Allergies:   Patient has no known allergies.   Social History   Socioeconomic History  . Marital status: Single    Spouse name: None  . Number of children: None  . Years of education: None  . Highest education level: None  Social  Needs  . Financial resource strain: None  . Food insecurity - worry: None  . Food insecurity - inability: None  . Transportation needs - medical: None  . Transportation needs - non-medical: None  Occupational History  . None  Tobacco Use  . Smoking status: Never Smoker  . Smokeless tobacco: Never Used  Substance and Sexual Activity  . Alcohol use: No  . Drug use: No  . Sexual activity: Not Currently  Other Topics Concern  . None  Social History Narrative  . None     Family History:  The patient's family history includes Cancer in her sister; Diabetes in her mother; Heart attack in her brother and mother.   ROS:   Please see the history of present illness.    ROS All other systems reviewed and are negative.  No flowsheet data found.     PHYSICAL EXAM:   VS:  BP 134/74   Pulse (!) 105   Ht 5\' 5"  (1.651 m)   Wt 161 lb 12.8 oz (73.4 kg)   BMI 26.92 kg/m    GEN: Well nourished, well developed, in no acute distress  HEENT: normal  Neck: no JVD, carotid bruits, or masses Cardiac: RRR; no murmurs, rubs, or gallops,no edema.  Intact distal pulses bilaterally.  Respiratory:  clear to auscultation bilaterally, normal work of breathing GI: soft, nontender, nondistended, + BS MS: no  deformity or atrophy  Skin: warm and dry, no rash Neuro:  Alert and Oriented x 3, Strength and sensation are intact Psych: euthymic mood, full affect  Wt Readings from Last 3 Encounters:  11/02/17 161 lb 12.8 oz (73.4 kg)  10/31/17 163 lb (73.9 kg)  10/19/17 163 lb (73.9 kg)      Studies/Labs Reviewed:   EKG:  EKG is  ordered today.  The ekg ordered today demonstrates sinus tachycardia at 105bpm with IRBBB and no ST chnages  Recent Labs: 10/19/2017: ALT 10; BUN 13; Creatinine, Ser 1.03; Potassium 4.8; Sodium 140   Lipid Panel    Component Value Date/Time   CHOL 157 10/19/2017 1041   TRIG 162.0 (H) 10/19/2017 1041   HDL 37.60 (L) 10/19/2017 1041   CHOLHDL 4 10/19/2017 1041   VLDL  32.4 10/19/2017 1041   LDLCALC 87 10/19/2017 1041    Additional studies/ records that were reviewed today include:  Office notes from PCP    ASSESSMENT:    1. Heart Murmur  2. Essential benign hypertension     PLAN:  In order of problems listed above:  1.  Heart murmur - echo normal except for possible PFO.  I do not hear a murmur on exam today.  I have recommended that we proceed with limited echo with bubble study.    2.  HTN - BP is well controlled on exam.  She will continue on HCTZ 25mg  daily and amlodipine 5mg  daily.      Medication Adjustments/Labs and Tests Ordered: Current medicines are reviewed at length with the patient today.  Concerns regarding medicines are outlined above.  Medication changes, Labs and Tests ordered today are listed in the Patient Instructions below.  Patient Instructions  Medication Instructions:  Your physician recommends that you continue on your current medications as directed. Please refer to the Current Medication list given to you today.  If you need a refill on your cardiac medications, please contact your pharmacy first.  Labwork: None ordered   Testing/Procedures: Your physician has requested that you have a limited bubble echocardiogram. Echocardiography is a painless test that uses sound waves to create images of your heart. It provides your doctor with information about the size and shape of your heart and how well your heart's chambers and valves are working. This procedure takes approximately one hour. There are no restrictions for this procedure.  Follow-Up: Your physician wants you to follow-up as needed with Dr. Mayford Knife.   Any Other Special Instructions Will Be Listed Below (If Applicable).   Thank you for choosing [facility]    Lyda Perone, RN  [phone]  If you need a refill on your cardiac medications before your next appointment, please call your pharmacy.      Signed, Armanda Magic, MD  11/02/2017  9:23 PM    [facility] [address] [place], [place], [place]  [address] Phone: [phone]; Fax: [phone]

## 2017-11-08 ENCOUNTER — Other Ambulatory Visit (HOSPITAL_COMMUNITY): Payer: Medicare HMO

## 2017-11-14 ENCOUNTER — Other Ambulatory Visit: Payer: Self-pay

## 2017-11-14 ENCOUNTER — Ambulatory Visit (HOSPITAL_COMMUNITY): Payer: Medicare HMO | Attending: Cardiology

## 2017-11-14 DIAGNOSIS — Q211 Atrial septal defect: Secondary | ICD-10-CM | POA: Diagnosis not present

## 2017-11-14 DIAGNOSIS — I10 Essential (primary) hypertension: Secondary | ICD-10-CM | POA: Diagnosis not present

## 2017-11-14 DIAGNOSIS — E785 Hyperlipidemia, unspecified: Secondary | ICD-10-CM | POA: Diagnosis not present

## 2017-11-14 DIAGNOSIS — R011 Cardiac murmur, unspecified: Secondary | ICD-10-CM | POA: Diagnosis not present

## 2017-11-14 DIAGNOSIS — Q2112 Patent foramen ovale: Secondary | ICD-10-CM

## 2017-11-14 LAB — ECHOCARDIOGRAM LIMITED BUBBLE STUDY
FS: 28 % (ref 28–44)
IVS/LV PW RATIO, ED: 0.72
LA diam index: 1.89 cm/m2
LASIZE: 35 mm
LDCA: 3.14 cm2
LEFT ATRIUM END SYS DIAM: 35 mm
LV PW d: 15.1 mm — AB (ref 0.6–1.1)
LVOTD: 20 mm

## 2018-01-25 ENCOUNTER — Ambulatory Visit
Admission: RE | Admit: 2018-01-25 | Discharge: 2018-01-25 | Disposition: A | Discharge status: Home / Self Care | Payer: Medicare HMO | Source: Ambulatory Visit | Attending: Nurse Practitioner | Admitting: Nurse Practitioner

## 2018-01-25 DIAGNOSIS — Z1231 Encounter for screening mammogram for malignant neoplasm of breast: Secondary | ICD-10-CM | POA: Diagnosis not present

## 2018-04-18 ENCOUNTER — Ambulatory Visit: Payer: Medicare HMO | Admitting: Nurse Practitioner

## 2018-04-18 ENCOUNTER — Encounter: Payer: Self-pay | Admitting: Nurse Practitioner

## 2018-04-18 ENCOUNTER — Ambulatory Visit (INDEPENDENT_AMBULATORY_CARE_PROVIDER_SITE_OTHER): Payer: Medicare HMO | Admitting: Nurse Practitioner

## 2018-04-18 ENCOUNTER — Telehealth: Payer: Self-pay | Admitting: Nurse Practitioner

## 2018-04-18 VITALS — BP 132/82 | HR 92 | Temp 97.9°F | Ht 65.0 in | Wt 160.0 lb

## 2018-04-18 DIAGNOSIS — I10 Essential (primary) hypertension: Secondary | ICD-10-CM | POA: Diagnosis not present

## 2018-04-18 DIAGNOSIS — Z1211 Encounter for screening for malignant neoplasm of colon: Secondary | ICD-10-CM

## 2018-04-18 DIAGNOSIS — M1712 Unilateral primary osteoarthritis, left knee: Secondary | ICD-10-CM

## 2018-04-18 MED ORDER — DICLOFENAC SODIUM 1 % TD GEL: 2.0000 g | Freq: Three times a day (TID) | TRANSDERMAL | 5 refills | Status: DC | PRN

## 2018-04-18 NOTE — Progress Notes (Signed)
Subjective:  Patient ID: Ebony Mayer, female    DOB: [birth year]  Age: 72 y.o. MRN: [id number]  CC: Follow-up (6 mo fu BP. not checking her BP at home regulary. voltarent gel refills send to cheaper pharmacy. refills meloxicam and gabapentin? cologuard consult?)   HPI Denies any acute complains. Will like cologuard order entered Will like refill on diclofenac gel, used on left knee.   HTN: Controlled with amlodipine and HCTZ BP Readings from Last 3 Encounters:  04/18/18 132/82  11/02/17 134/74  10/31/17 (!) 143/70   Reviewed past Medical, Social and Family history today.  Outpatient Medications Prior to Visit  Medication Sig Dispense Refill  . amLODipine (NORVASC) 5 MG tablet Take 1 tablet (5 mg total) by mouth at bedtime. Need Office Visit for further refill 90 tablet 3  . gabapentin (NEURONTIN) 300 MG capsule Take 1 capsule (300 mg total) by mouth at bedtime. 90 capsule 3  . hydrochlorothiazide (HYDRODIURIL) 25 MG tablet Take 1 tablet (25 mg total) by mouth every morning. 90 tablet 3  . diclofenac sodium (VOLTAREN) 1 % GEL Apply 2 g topically 3 (three) times daily as needed. 100 g 1   No facility-administered medications prior to visit.     ROS See HPI  Objective:  BP 132/82   Pulse 92   Temp 97.9 F (36.6 C) (Oral)   Ht 5\' 5"  (1.651 m)   Wt 160 lb (72.6 kg)   SpO2 100%   BMI 26.63 kg/m   BP Readings from Last 3 Encounters:  04/18/18 132/82  11/02/17 134/74  10/31/17 (!) 143/70    Wt Readings from Last 3 Encounters:  04/18/18 160 lb (72.6 kg)  11/02/17 161 lb 12.8 oz (73.4 kg)  10/31/17 163 lb (73.9 kg)    Physical Exam  Constitutional: She is oriented to person, place, and time.  Cardiovascular: Normal rate, regular rhythm and normal heart sounds.  Pulmonary/Chest: Effort normal and breath sounds normal.  Musculoskeletal: She exhibits no edema.  Neurological: She is alert and oriented to person, place, and time.  Vitals reviewed.   Lab Results    Component Value Date   WBC 3.9 (L) 04/27/2016   HGB 12.9 04/27/2016   HCT 39.3 04/27/2016   PLT 136 (L) 04/27/2016   GLUCOSE 111 (H) 10/19/2017   CHOL 157 10/19/2017   TRIG 162.0 (H) 10/19/2017   HDL 37.60 (L) 10/19/2017   LDLCALC 87 10/19/2017   ALT 10 10/19/2017   AST 13 10/19/2017   NA 140 10/19/2017   K 4.8 10/19/2017   CL 100 10/19/2017   CREATININE 1.03 10/19/2017   BUN 13 10/19/2017   CO2 34 (H) 10/19/2017   TSH 1.02 05/31/2016   HGBA1C 5.4 10/19/2017    Mm Screening Breast Tomo Bilateral  Result Date: 01/25/2018 CLINICAL DATA:  Screening. EXAM: DIGITAL SCREENING BILATERAL MAMMOGRAM WITH TOMO AND CAD COMPARISON:  None. ACR Breast Density Category c: The breast tissue is heterogeneously dense, which may obscure small masses FINDINGS: There are no findings suspicious for malignancy. Images were processed with CAD. IMPRESSION: No mammographic evidence of malignancy. A result letter of this screening mammogram will be mailed directly to the patient. RECOMMENDATION: Screening mammogram in one year. (Code:SM-B-01Y) BI-RADS CATEGORY  1: Negative. Electronically Signed   By: Elberta Fortis M.D.   On: 01/25/2018 12:36    Assessment & Plan:   Ebony Mayer was seen today for follow-up.  Diagnoses and all orders for this visit:  Essential hypertension, benign  Patellofemoral arthritis of  left knee -     diclofenac sodium (VOLTAREN) 1 % GEL; Apply 2 g topically 3 (three) times daily as needed.  Colon cancer screening -     Cologuard   I am having Ebony Mayer maintain her amLODipine, gabapentin, hydrochlorothiazide, and diclofenac sodium.  Meds ordered this encounter  Medications  . diclofenac sodium (VOLTAREN) 1 % GEL    Sig: Apply 2 g topically 3 (three) times daily as needed.    Dispense:  100 g    Refill:  5    Order Specific Question:   Supervising Provider    Answer:   Dianne DunARON, TALIA M [[id number]]    Follow-up: Return in about 6 months (around 10/19/2018) for AWV (with  wellness coach) and HTN f/up with me (fasting).  Ebony Pennaharlotte Rylea Selway, NP

## 2018-04-18 NOTE — Patient Instructions (Signed)
You will be contacted about cologuard kit (to screen for colon cancer).  Maintain current medications. Contact your pharmacy for refills

## 2018-04-18 NOTE — Telephone Encounter (Signed)
Cologuard ordered. Waiting for result.

## 2018-04-25 DIAGNOSIS — R69 Illness, unspecified: Secondary | ICD-10-CM | POA: Diagnosis not present

## 2018-05-17 DIAGNOSIS — Z1212 Encounter for screening for malignant neoplasm of rectum: Secondary | ICD-10-CM | POA: Diagnosis not present

## 2018-05-17 DIAGNOSIS — Z1211 Encounter for screening for malignant neoplasm of colon: Secondary | ICD-10-CM | POA: Diagnosis not present

## 2018-05-17 LAB — COLOGUARD: COLOGUARD: NEGATIVE

## 2018-05-22 NOTE — Telephone Encounter (Signed)
Negative result -

## 2018-05-23 ENCOUNTER — Encounter: Payer: Self-pay | Admitting: Nurse Practitioner

## 2018-05-23 NOTE — Progress Notes (Signed)
Abstracted result and sent to scan  

## 2018-08-11 ENCOUNTER — Ambulatory Visit: Payer: Self-pay

## 2018-08-11 NOTE — Telephone Encounter (Signed)
Returned call to patient who states she started with leg cramps yesterday after standing most of the day.  She wanted advice on treatment of leg cramps. Pt denies swelling or warmth to the muscles. The cramps effect both legs. She has treated them in the past with voltaren rub prescribed for her arthritis pain. Per protocol pt was given home care advice. Pt wishes to schedule appointment to see Dr Elease EtienneNche but will call back when she knows her daughters schedule and make the appointment.   Reason for Disposition . [1] Caused by muscle cramps in the thigh, calf, or foot AND [2] present < 1 hour (brief, now gone)  Answer Assessment - Initial Assessment Questions 1. ONSET: "When did the pain start?"      yesterday 2. LOCATION: "Where is the pain located?"      Both legs 3. PAIN: "How bad is the pain?"    (Scale 1-10; or mild, moderate, severe)   -  MILD (1-3): doesn't interfere with normal activities    -  MODERATE (4-7): interferes with normal activities (e.g., work or school) or awakens from sleep, limping    -  SEVERE (8-10): excruciating pain, unable to do any normal activities, unable to walk     4-5 4. WORK OR EXERCISE: "Has there been any recent work or exercise that involved this part of the body?"     working 5. CAUSE: "What do you think is causing the leg pain?"     Standing all day 6. OTHER SYMPTOMS: "Do you have any other symptoms?" (e.g., chest pain, back pain, breathing difficulty, swelling, rash, fever, numbness, weakness)     no 7. PREGNANCY: "Is there any chance you are pregnant?" "When was your last menstrual period?"     no  Protocols used: LEG PAIN-A-AH

## 2018-08-27 ENCOUNTER — Emergency Department (HOSPITAL_COMMUNITY)
Admission: EM | Admit: 2018-08-27 | Discharge: 2018-08-27 | Disposition: A | Discharge status: Home / Self Care | Payer: Medicare HMO | Attending: Emergency Medicine | Admitting: Emergency Medicine

## 2018-08-27 ENCOUNTER — Emergency Department (HOSPITAL_COMMUNITY): Payer: Medicare HMO

## 2018-08-27 ENCOUNTER — Encounter (HOSPITAL_COMMUNITY): Payer: Self-pay

## 2018-08-27 DIAGNOSIS — I10 Essential (primary) hypertension: Secondary | ICD-10-CM | POA: Insufficient documentation

## 2018-08-27 DIAGNOSIS — R42 Dizziness and giddiness: Secondary | ICD-10-CM

## 2018-08-27 DIAGNOSIS — Z79899 Other long term (current) drug therapy: Secondary | ICD-10-CM | POA: Diagnosis not present

## 2018-08-27 DIAGNOSIS — R9431 Abnormal electrocardiogram [ECG] [EKG]: Secondary | ICD-10-CM | POA: Diagnosis not present

## 2018-08-27 LAB — URINALYSIS, MICROSCOPIC (REFLEX)

## 2018-08-27 LAB — CBC WITH DIFFERENTIAL/PLATELET
Abs Immature Granulocytes: 0.01 10*3/uL (ref 0.00–0.07)
Basophils Absolute: 0 10*3/uL (ref 0.0–0.1)
Basophils Relative: 1 %
EOS PCT: 0 %
Eosinophils Absolute: 0 10*3/uL (ref 0.0–0.5)
HCT: 43.2 % (ref 36.0–46.0)
Hemoglobin: 13.6 g/dL (ref 12.0–15.0)
Immature Granulocytes: 0 %
Lymphocytes Relative: 25 %
Lymphs Abs: 1 10*3/uL (ref 0.7–4.0)
MCH: 29.2 pg (ref 26.0–34.0)
MCHC: 31.5 g/dL (ref 30.0–36.0)
MCV: 92.9 fL (ref 80.0–100.0)
Monocytes Absolute: 0.3 10*3/uL (ref 0.1–1.0)
Monocytes Relative: 8 %
Neutro Abs: 2.7 10*3/uL (ref 1.7–7.7)
Neutrophils Relative %: 66 %
Platelets: 192 10*3/uL (ref 150–400)
RBC: 4.65 MIL/uL (ref 3.87–5.11)
RDW: 11.9 % (ref 11.5–15.5)
WBC: 4 10*3/uL (ref 4.0–10.5)
nRBC: 0 % (ref 0.0–0.2)

## 2018-08-27 LAB — COMPREHENSIVE METABOLIC PANEL
ALBUMIN: 4.5 g/dL (ref 3.5–5.0)
ALT: 15 U/L (ref 0–44)
ANION GAP: 9 (ref 5–15)
AST: 20 U/L (ref 15–41)
Alkaline Phosphatase: 48 U/L (ref 38–126)
BUN: 14 mg/dL (ref 8–23)
CO2: 29 mmol/L (ref 22–32)
Calcium: 9.4 mg/dL (ref 8.9–10.3)
Chloride: 100 mmol/L (ref 98–111)
Creatinine, Ser: 1.04 mg/dL — ABNORMAL HIGH (ref 0.44–1.00)
GFR calc Af Amer: >60 mL/min (ref >60)
GFR calc non Af Amer: 54 mL/min — ABNORMAL LOW (ref >60)
GLUCOSE: 116 mg/dL — AB (ref 70–99)
POTASSIUM: 3.6 mmol/L (ref 3.5–5.1)
Sodium: 138 mmol/L (ref 135–145)
Total Bilirubin: 0.9 mg/dL (ref 0.3–1.2)
Total Protein: 8 g/dL (ref 6.5–8.1)

## 2018-08-27 LAB — URINALYSIS, ROUTINE W REFLEX MICROSCOPIC
Bilirubin Urine: NEGATIVE
Glucose, UA: NEGATIVE mg/dL
Nitrite: NEGATIVE
Specific Gravity, Urine: 1.025 (ref 1.005–1.030)
pH: 6 (ref 5.0–8.0)

## 2018-08-27 LAB — CBG MONITORING, ED: Glucose-Capillary: 100 mg/dL — ABNORMAL HIGH (ref 70–99)

## 2018-08-27 LAB — I-STAT TROPONIN, ED: Troponin i, poc: 0 ng/mL (ref 0.00–0.08)

## 2018-08-27 MED ORDER — SODIUM CHLORIDE 0.9 % IV BOLUS
1000.0000 mL | Freq: Once | INTRAVENOUS | Status: AC
  Administered 2018-08-27: 1000 mL via INTRAVENOUS

## 2018-08-27 MED ORDER — MECLIZINE HCL 12.5 MG PO TABS: 12.5000 mg | ORAL_TABLET | Freq: Three times a day (TID) | ORAL | 0 refills | Status: DC | PRN

## 2018-08-27 NOTE — Discharge Instructions (Signed)
Take meclizine as needed for dizziness   Your CT today showed possible old stroke. Follow up with neurologist   Return to ER if you have worse dizziness, passing out, chest pain, trouble speaking, weakness

## 2018-08-27 NOTE — ED Triage Notes (Signed)
Patient felt dizzy this morning lasting about 10 min and sat down. Denies falling. Patient reports having a cold for about 3 days. Patient states she has not been drinking any fluids this morning. Patient states she was up walking around this morning and made her bed. Patient then went into the bathroom and felt dizzy. Patient states this is the first time she has felt dizzy in a long time.   Patient denies being dizzy at this time.  Patient denies being in pain.   A/Ox4 Ambulatory in triage.

## 2018-08-27 NOTE — ED Provider Notes (Signed)
[facility]-EMERGENCY DEPT Provider Note   CSN: [id number] Arrival date & time: 08/27/18  1200     History   Chief Complaint Chief Complaint  Patient presents with  . Dizziness    HPI Ebony Mayer is a 72 y.o. female history of hypertension, hyperlipidemia, here presenting with dizziness, lightheadedness.  Patient states that she woke around 10 AM.  She sat up on her bed and stood up suddenly and felt lightheaded and dizzy.  She did not eat and drink anything this morning.  She states that the feeling lasted about 10 minutes.  Denies that the room was spinning.  Denies any headache or weakness or trouble speaking at that time.  Patient states that she does not usually get dizzy.  She states that she had no history of strokes in the past.  The history is provided by the patient.    Past Medical History:  Diagnosis Date  . Hypertension   . Mixed hyperlipidemia 10/19/2017  . Patellofemoral arthritis of left knee 07/08/2016    Patient Active Problem List   Diagnosis Date Noted  . Mixed hyperlipidemia 10/19/2017  . Hyperglycemia 10/19/2017  . Patellofemoral arthritis of left knee 07/08/2016  . It band syndrome, left 06/15/2016  . Essential hypertension, benign 05/31/2016  . Leg pain, lateral 05/31/2016  . Varicose veins of left lower extremity with pain 05/31/2016  . Onychomycosis 10/10/2013  . Porokeratosis 10/10/2013    History reviewed. No pertinent surgical history.   OB History   No obstetric history on file.      Home Medications    Prior to Admission medications   Medication Sig Start Date End Date Taking? Authorizing Provider  amLODipine (NORVASC) 5 MG tablet Take 1 tablet (5 mg total) by mouth at bedtime. Need Office Visit for further refill 10/19/17  Yes Nche, Bonna Gains, NP  diclofenac sodium (VOLTAREN) 1 % GEL Apply 2 g topically 3 (three) times daily as needed. Patient taking differently: Apply 2 g topically 3 (three) times daily  as needed (pain).  04/18/18  Yes Nche, Bonna Gains, NP  gabapentin (NEURONTIN) 300 MG capsule Take 1 capsule (300 mg total) by mouth at bedtime. 10/19/17  Yes Nche, Bonna Gains, NP  hydrochlorothiazide (HYDRODIURIL) 25 MG tablet Take 1 tablet (25 mg total) by mouth every morning. 11/02/17  Yes Turner, Cornelious Bryant, MD    Family History Family History  Problem Relation Age of Onset  . Diabetes Mother   . Heart attack Mother   . Cancer Sister   . Heart attack Brother     Social History Social History   Tobacco Use  . Smoking status: Never Smoker  . Smokeless tobacco: Never Used  Substance Use Topics  . Alcohol use: No  . Drug use: No     Allergies   Patient has no known allergies.   Review of Systems Review of Systems  Neurological: Positive for dizziness.  All other systems reviewed and are negative.    Physical Exam Updated Vital Signs BP 128/67   Pulse 94   Temp 98.1 F (36.7 C) (Oral)   Resp 17   SpO2 100%   Physical Exam Vitals signs and nursing note reviewed.  Constitutional:      Appearance: Normal appearance. She is normal weight.  HENT:     Head: Normocephalic and atraumatic.     Right Ear: Tympanic membrane normal.     Left Ear: Tympanic membrane normal.     Nose: Nose normal.  Mouth/Throat:     Mouth: Mucous membranes are moist.  Eyes:     Extraocular Movements: Extraocular movements intact.     Pupils: Pupils are equal, round, and reactive to light.  Neck:     Musculoskeletal: Normal range of motion.  Cardiovascular:     Rate and Rhythm: Normal rate and regular rhythm.     Pulses: Normal pulses.     Heart sounds: Normal heart sounds.  Pulmonary:     Effort: Pulmonary effort is normal.     Breath sounds: Normal breath sounds.  Abdominal:     General: Abdomen is flat.     Palpations: Abdomen is soft.  Musculoskeletal: Normal range of motion.  Skin:    General: Skin is warm.     Capillary Refill: Capillary refill takes less than 2  seconds.  Neurological:     General: No focal deficit present.     Mental Status: She is alert and oriented to person, place, and time.     Cranial Nerves: No cranial nerve deficit.     Motor: No weakness.     Comments: No obvious nystagmus, nl finger to nose bilaterally, nl strength throughout   Psychiatric:        Mood and Affect: Mood normal.      ED Treatments / Results  Labs (all labs ordered are listed, but only abnormal results are displayed) Labs Reviewed  URINALYSIS, ROUTINE W REFLEX MICROSCOPIC - Abnormal; Notable for the following components:      Result Value   Hgb urine dipstick TRACE (*)    Ketones, ur TRACE (*)    Protein, ur TRACE (*)    Leukocytes, UA TRACE (*)    All other components within normal limits  COMPREHENSIVE METABOLIC PANEL - Abnormal; Notable for the following components:   Glucose, Bld 116 (*)    Creatinine, Ser 1.04 (*)    GFR calc non Af Amer 54 (*)    All other components within normal limits  URINALYSIS, MICROSCOPIC (REFLEX) - Abnormal; Notable for the following components:   Bacteria, UA RARE (*)    All other components within normal limits  CBG MONITORING, ED - Abnormal; Notable for the following components:   Glucose-Capillary 100 (*)    All other components within normal limits  CBC WITH DIFFERENTIAL/PLATELET  I-STAT TROPONIN, ED    EKG EKG Interpretation  Date/Time:  Sunday August 27 2018 12:22:45 EST Ventricular Rate:  92 PR Interval:    QRS Duration: 81 QT Interval:  347 QTC Calculation: 430 R Axis:   -10 Text Interpretation:  Sinus rhythm Abnormal R-wave progression, early transition No previous ECGs available Confirmed by Richardean CanalYao, David H [phone]([id number]) on 08/27/2018 2:05:37 PM   Radiology Ct Head Wo Contrast  Result Date: 08/27/2018 CLINICAL DATA:  Dizziness. EXAM: CT HEAD WITHOUT CONTRAST TECHNIQUE: Contiguous axial images were obtained from the base of the skull through the vertex without intravenous contrast. COMPARISON:   None. FINDINGS: Brain: Potential old infarct within the left cerebellar hemisphere (image 6, series 3; coronal image 50, series 5). Gray-white differentiation is otherwise well maintained without CT evidence of acute large territory infarct. No intraparenchymal or extra-axial mass or hemorrhage. Note is made of a cavum septum pellucidum. Otherwise, normal size and configuration of the ventricles and the basilar cisterns. No midline shift. Vascular: No hyperdense vessel or unexpected calcification. Skull: No displaced calvarial fracture. Sinuses/Orbits: Limited visualization the paranasal sinuses and mastoid air cells is normal. No air-fluid levels. Other: Regional soft tissues appear  normal. IMPRESSION: 1. No acute intracranial process. 2. Suspected old left cerebellar hemisphere infarct. Electronically Signed   By: Simonne Come M.D.   On: 08/27/2018 14:57    Procedures Procedures (including critical care time)  Medications Ordered in ED Medications  sodium chloride 0.9 % bolus 1,000 mL (1,000 mLs Intravenous New Bag/Given 08/27/18 1337)     Initial Impression / Assessment and Plan / ED Course  I have reviewed the triage vital signs and the nursing notes.  Pertinent labs & imaging results that were available during my care of the patient were reviewed by me and considered in my medical decision making (see chart for details).     Caci Belle is a 72 y.o. female here with dizziness. Likely dehydration or orthostasis. Low suspicion for posterior circulation stroke symptoms improved. No actual syncope. Will check labs, CT head, EKG, orthostatics. Will hydrate patient.   3:38 PM  UA showed some ketones. Labs unremarkable. CT showed likely old cerebellar stroke. Nl finger to nose, nl gait currently. I don't think she has acute cerebellar stroke so doesn't need emergent MRI. Will refer to neuro for outpatient eval. Will give meclizine prn dizziness.    Final Clinical Impressions(s) / ED Diagnoses    Final diagnoses:  None    ED Discharge Orders    None       Charlynne Pander, MD 08/27/18 1540

## 2018-08-31 ENCOUNTER — Ambulatory Visit (INDEPENDENT_AMBULATORY_CARE_PROVIDER_SITE_OTHER): Payer: Medicare HMO | Admitting: Nurse Practitioner

## 2018-08-31 ENCOUNTER — Encounter: Payer: Self-pay | Admitting: Nurse Practitioner

## 2018-08-31 VITALS — BP 136/78 | HR 89 | Temp 98.0°F | Ht 65.0 in | Wt 146.0 lb

## 2018-08-31 DIAGNOSIS — E782 Mixed hyperlipidemia: Secondary | ICD-10-CM | POA: Diagnosis not present

## 2018-08-31 DIAGNOSIS — R42 Dizziness and giddiness: Secondary | ICD-10-CM | POA: Diagnosis not present

## 2018-08-31 DIAGNOSIS — I10 Essential (primary) hypertension: Secondary | ICD-10-CM

## 2018-08-31 NOTE — Patient Instructions (Signed)
Maintain appt with neurology.  Ways to lower risk of stroke: maintain adequate BP control, improve triglyceride and HDL numbers, and maintain normal glucose level through diet and exercise.  I recommend daily exercise" walking 30-3545mins per day. Maintain DASH diet. Maintain adequate oral hydration with water mostly: 40-60oz per day.   DASH Eating Plan DASH stands for "Dietary Approaches to Stop Hypertension." The DASH eating plan is a healthy eating plan that has been shown to reduce high blood pressure (hypertension). It may also reduce your risk for type 2 diabetes, heart disease, and stroke. The DASH eating plan may also help with weight loss. What are tips for following this plan?  General guidelines  Avoid eating more than 2,300 mg (milligrams) of salt (sodium) a day. If you have hypertension, you may need to reduce your sodium intake to 1,500 mg a day.  Limit alcohol intake to no more than 1 drink a day for nonpregnant women and 2 drinks a day for men. One drink equals 12 oz of beer, 5 oz of wine, or 1 oz of hard liquor.  Work with your health care provider to maintain a healthy body weight or to lose weight. Ask what an ideal weight is for you.  Get at least 30 minutes of exercise that causes your heart to beat faster (aerobic exercise) most days of the week. Activities may include walking, swimming, or biking.  Work with your health care provider or diet and nutrition specialist (dietitian) to adjust your eating plan to your individual calorie needs. Reading food labels   Check food labels for the amount of sodium per serving. Choose foods with less than 5 percent of the Daily Value of sodium. Generally, foods with less than 300 mg of sodium per serving fit into this eating plan.  To find whole grains, look for the word "whole" as the first word in the ingredient list. Shopping  Buy products labeled as "low-sodium" or "no salt added."  Buy fresh foods. Avoid canned foods  and premade or frozen meals. Cooking  Avoid adding salt when cooking. Use salt-free seasonings or herbs instead of table salt or sea salt. Check with your health care provider or pharmacist before using salt substitutes.  Do not fry foods. Cook foods using healthy methods such as baking, boiling, grilling, and broiling instead.  Cook with heart-healthy oils, such as olive, canola, soybean, or sunflower oil. Meal planning  Eat a balanced diet that includes: ? 5 or more servings of fruits and vegetables each day. At each meal, try to fill half of your plate with fruits and vegetables. ? Up to 6-8 servings of whole grains each day. ? Less than 6 oz of lean meat, poultry, or fish each day. A 3-oz serving of meat is about the same size as a deck of cards. One egg equals 1 oz. ? 2 servings of low-fat dairy each day. ? A serving of nuts, seeds, or beans 5 times each week. ? Heart-healthy fats. Healthy fats called Omega-3 fatty acids are found in foods such as flaxseeds and coldwater fish, like sardines, salmon, and mackerel.  Limit how much you eat of the following: ? Canned or prepackaged foods. ? Food that is high in trans fat, such as fried foods. ? Food that is high in saturated fat, such as fatty meat. ? Sweets, desserts, sugary drinks, and other foods with added sugar. ? Full-fat dairy products.  Do not salt foods before eating.  Try to eat at least 2 vegetarian  meals each week.  Eat more home-cooked food and less restaurant, buffet, and fast food.  When eating at a restaurant, ask that your food be prepared with less salt or no salt, if possible. What foods are recommended? The items listed may not be a complete list. Talk with your dietitian about what dietary choices are best for you. Grains Whole-grain or whole-wheat bread. Whole-grain or whole-wheat pasta. Brown rice. [place]row. Bulgur. Whole-grain and low-sodium cereals. Pita bread. Low-fat, low-sodium crackers.  Whole-wheat flour tortillas. Vegetables Fresh or frozen vegetables (raw, steamed, roasted, or grilled). Low-sodium or reduced-sodium tomato and vegetable juice. Low-sodium or reduced-sodium tomato sauce and tomato paste. Low-sodium or reduced-sodium canned vegetables. Fruits All fresh, dried, or frozen fruit. Canned fruit in natural juice (without added sugar). Meat and other protein foods Skinless chicken or [place]. Ground chicken or [place]. Pork with fat trimmed off. Fish and seafood. Egg whites. Dried beans, peas, or lentils. Unsalted nuts, nut butters, and seeds. Unsalted canned beans. Lean cuts of beef with fat trimmed off. Low-sodium, lean deli meat. Dairy Low-fat (1%) or fat-free (skim) milk. Fat-free, low-fat, or reduced-fat cheeses. Nonfat, low-sodium ricotta or cottage cheese. Low-fat or nonfat yogurt. Low-fat, low-sodium cheese. Fats and oils Soft margarine without trans fats. Vegetable oil. Low-fat, reduced-fat, or light mayonnaise and salad dressings (reduced-sodium). Canola, safflower, olive, soybean, and sunflower oils. Avocado. Seasoning and other foods Herbs. Spices. Seasoning mixes without salt. Unsalted popcorn and pretzels. Fat-free sweets. What foods are not recommended? The items listed may not be a complete list. Talk with your dietitian about what dietary choices are best for you. Grains Baked goods made with fat, such as croissants, muffins, or some breads. Dry pasta or rice meal packs. Vegetables Creamed or fried vegetables. Vegetables in a cheese sauce. Regular canned vegetables (not low-sodium or reduced-sodium). Regular canned tomato sauce and paste (not low-sodium or reduced-sodium). Regular tomato and vegetable juice (not low-sodium or reduced-sodium). Angie Fava. Olives. Fruits Canned fruit in a light or heavy syrup. Fried fruit. Fruit in cream or butter sauce. Meat and other protein foods Fatty cuts of meat. Ribs. Fried meat. Berniece Salines. Sausage. Bologna and other  processed lunch meats. Salami. Fatback. Hotdogs. Bratwurst. Salted nuts and seeds. Canned beans with added salt. Canned or smoked fish. Whole eggs or egg yolks. Chicken or [place] with skin. Dairy Whole or 2% milk, cream, and half-and-half. Whole or full-fat cream cheese. Whole-fat or sweetened yogurt. Full-fat cheese. Nondairy creamers. Whipped toppings. Processed cheese and cheese spreads. Fats and oils Butter. Stick margarine. Lard. Shortening. Ghee. Bacon fat. Tropical oils, such as coconut, palm kernel, or palm oil. Seasoning and other foods Salted popcorn and pretzels. Onion salt, garlic salt, seasoned salt, table salt, and sea salt. Worcestershire sauce. Tartar sauce. Barbecue sauce. Teriyaki sauce. Soy sauce, including reduced-sodium. Steak sauce. Canned and packaged gravies. Fish sauce. Oyster sauce. Cocktail sauce. Horseradish that you find on the shelf. Ketchup. Mustard. Meat flavorings and tenderizers. Bouillon cubes. Hot sauce and Tabasco sauce. Premade or packaged marinades. Premade or packaged taco seasonings. Relishes. Regular salad dressings. Where to find more information:  National Heart, Lung, and [place]: https://wilson-eaton.com/  American Heart Association: www.heart.org Summary  The DASH eating plan is a healthy eating plan that has been shown to reduce high blood pressure (hypertension). It may also reduce your risk for type 2 diabetes, heart disease, and stroke.  With the DASH eating plan, you should limit salt (sodium) intake to 2,300 mg a day. If you have hypertension, you may need to  reduce your sodium intake to 1,500 mg a day.  When on the DASH eating plan, aim to eat more fresh fruits and vegetables, whole grains, lean proteins, low-fat dairy, and heart-healthy fats.  Work with your health care provider or diet and nutrition specialist (dietitian) to adjust your eating plan to your individual calorie needs. This information is not intended to replace advice given to  you by your health care provider. Make sure you discuss any questions you have with your health care provider. Document Released: 08/19/2011 Document Revised: 08/23/2016 Document Reviewed: 08/23/2016 Elsevier Interactive Patient Education  2019 [employer]

## 2018-08-31 NOTE — Progress Notes (Signed)
Subjective:  Patient ID: Ebony Mayer, female    DOB: 11/22/1945  Age: 72 y.o. MRN: [id number]  CC: Hospitalization Follow-up (went into the hospital for dizzieness/still has some dizzy at times. )  HPI   Accompanied by daughter.  [name] was evaluate at hospital ED 08/27/18 with compliant of dizziness. Acute cerebral and cardiac infarct was ruled out. Meclizine was prescribed and she was referred to neurology due to CT findings. CT Head indicated previous cerebral infarct. appt with [facility] 10/17/2017.  Today she reports resolved dizziness.  HTN: Controlled with norvasc and HCTZ. BP Readings from Last 3 Encounters:  08/31/18 136/78  08/27/18 106/73  04/18/18 132/82   Last lipid panel done 10/2017: elevated triglyceride and low HDL. Last HgbA1c of 5.4 done 10/2017 No tobacco use, no ETOH use.  Reviewed past Medical, Social and Family history today.  Outpatient Medications Prior to Visit  Medication Sig Dispense Refill  . amLODipine (NORVASC) 5 MG tablet Take 1 tablet (5 mg total) by mouth at bedtime. Need Office Visit for further refill 90 tablet 3  . diclofenac sodium (VOLTAREN) 1 % GEL Apply 2 g topically 3 (three) times daily as needed. (Patient taking differently: Apply 2 g topically 3 (three) times daily as needed (pain). ) 100 g 5  . gabapentin (NEURONTIN) 300 MG capsule Take 1 capsule (300 mg total) by mouth at bedtime. 90 capsule 3  . hydrochlorothiazide (HYDRODIURIL) 25 MG tablet Take 1 tablet (25 mg total) by mouth every morning. 90 tablet 3  . meclizine (ANTIVERT) 12.5 MG tablet Take 1 tablet (12.5 mg total) by mouth 3 (three) times daily as needed for dizziness. 15 tablet 0   No facility-administered medications prior to visit.     ROS See HPI  Objective:  BP 136/78   Pulse 89   Temp 98 F (36.7 C) (Oral)   Ht 5\' 5"  (1.651 m)   Wt 146 lb (66.2 kg)   SpO2 97%   BMI 24.30 kg/m   BP Readings from Last 3 Encounters:  08/31/18 136/78  08/27/18 106/73    04/18/18 132/82    Wt Readings from Last 3 Encounters:  08/31/18 146 lb (66.2 kg)  04/18/18 160 lb (72.6 kg)  11/02/17 161 lb 12.8 oz (73.4 kg)    Physical Exam Vitals signs reviewed.  Constitutional:      Appearance: Normal appearance.  HENT:     Right Ear: Tympanic membrane, ear canal and external ear normal.     Left Ear: Tympanic membrane, ear canal and external ear normal.  Eyes:     Extraocular Movements: Extraocular movements intact.     Conjunctiva/sclera: Conjunctivae normal.  Neck:     Musculoskeletal: Normal range of motion and neck supple.  Cardiovascular:     Rate and Rhythm: Normal rate and regular rhythm.  Pulmonary:     Effort: Pulmonary effort is normal.     Breath sounds: Normal breath sounds.  Musculoskeletal:     Right lower leg: No edema.     Left lower leg: No edema.  Neurological:     General: No focal deficit present.     Mental Status: She is alert and oriented to person, place, and time.  Psychiatric:        Mood and Affect: Mood normal.        Behavior: Behavior normal.        Thought Content: Thought content normal.     Lab Results  Component Value Date   WBC 4.0  08/27/2018   HGB 13.6 08/27/2018   HCT 43.2 08/27/2018   PLT 192 08/27/2018   GLUCOSE 116 (H) 08/27/2018   CHOL 157 10/19/2017   TRIG 162.0 (H) 10/19/2017   HDL 37.60 (L) 10/19/2017   LDLCALC 87 10/19/2017   ALT 15 08/27/2018   AST 20 08/27/2018   NA 138 08/27/2018   K 3.6 08/27/2018   CL 100 08/27/2018   CREATININE 1.04 (H) 08/27/2018   BUN 14 08/27/2018   CO2 29 08/27/2018   TSH 1.02 05/31/2016   HGBA1C 5.4 10/19/2017    Ct Head Wo Contrast  Result Date: 08/27/2018 CLINICAL DATA:  Dizziness. EXAM: CT HEAD WITHOUT CONTRAST TECHNIQUE: Contiguous axial images were obtained from the base of the skull through the vertex without intravenous contrast. COMPARISON:  None. FINDINGS: Brain: Potential old infarct within the left cerebellar hemisphere (image 6, series 3;  coronal image 50, series 5). Gray-white differentiation is otherwise well maintained without CT evidence of acute large territory infarct. No intraparenchymal or extra-axial mass or hemorrhage. Note is made of a cavum septum pellucidum. Otherwise, normal size and configuration of the ventricles and the basilar cisterns. No midline shift. Vascular: No hyperdense vessel or unexpected calcification. Skull: No displaced calvarial fracture. Sinuses/Orbits: Limited visualization the paranasal sinuses and mastoid air cells is normal. No air-fluid levels. Other: Regional soft tissues appear normal. IMPRESSION: 1. No acute intracranial process. 2. Suspected old left cerebellar hemisphere infarct. Electronically Signed   By: Simonne ComeJohn  Watts M.D.   On: 08/27/2018 14:57    Assessment & Plan:   [name] was seen today for hospitalization follow-up.  Diagnoses and all orders for this visit:  Essential hypertension, benign  Mixed hyperlipidemia  Dizziness   I am having Bekka Mccarroll maintain her amLODipine, gabapentin, hydrochlorothiazide, diclofenac sodium, and meclizine.  No orders of the defined types were placed in this encounter.   Problem List Items Addressed This Visit      Cardiovascular and Mediastinum   Essential hypertension, benign - Primary     Other   Mixed hyperlipidemia    Other Visit Diagnoses    Dizziness           Follow-up: Return in about 3 months (around 11/30/2018) for CPE (fasting) and make appt with wellness coach for AWV.  Alysia Pennaharlotte Yalexa Blust, NP

## 2018-09-01 ENCOUNTER — Emergency Department (HOSPITAL_COMMUNITY)
Admission: EM | Admit: 2018-09-01 | Discharge: 2018-09-01 | Disposition: A | Discharge status: Home / Self Care | Payer: Medicare HMO | Attending: Emergency Medicine | Admitting: Emergency Medicine

## 2018-09-01 ENCOUNTER — Encounter (HOSPITAL_COMMUNITY): Payer: Self-pay | Admitting: Emergency Medicine

## 2018-09-01 ENCOUNTER — Other Ambulatory Visit: Payer: Self-pay

## 2018-09-01 DIAGNOSIS — Y998 Other external cause status: Secondary | ICD-10-CM | POA: Diagnosis not present

## 2018-09-01 DIAGNOSIS — W01198A Fall on same level from slipping, tripping and stumbling with subsequent striking against other object, initial encounter: Secondary | ICD-10-CM | POA: Diagnosis not present

## 2018-09-01 DIAGNOSIS — Y939 Activity, unspecified: Secondary | ICD-10-CM | POA: Insufficient documentation

## 2018-09-01 DIAGNOSIS — Z79899 Other long term (current) drug therapy: Secondary | ICD-10-CM | POA: Diagnosis not present

## 2018-09-01 DIAGNOSIS — E782 Mixed hyperlipidemia: Secondary | ICD-10-CM | POA: Diagnosis not present

## 2018-09-01 DIAGNOSIS — S0990XA Unspecified injury of head, initial encounter: Secondary | ICD-10-CM | POA: Diagnosis not present

## 2018-09-01 DIAGNOSIS — W19XXXA Unspecified fall, initial encounter: Secondary | ICD-10-CM

## 2018-09-01 DIAGNOSIS — Y929 Unspecified place or not applicable: Secondary | ICD-10-CM | POA: Diagnosis not present

## 2018-09-01 DIAGNOSIS — I10 Essential (primary) hypertension: Secondary | ICD-10-CM | POA: Insufficient documentation

## 2018-09-01 NOTE — ED Notes (Signed)
ED Provider at bedside. 

## 2018-09-01 NOTE — ED Notes (Signed)
Gillian ShieldsJ. Robinson, PA notified of pt.  No imaging at this time.

## 2018-09-01 NOTE — ED Triage Notes (Signed)
Pt slipped on a bag in her kitchen floor around 7pm and fell.  Hit L side of head on wall.  Denies LOC.  Feels "a little" dizzy after falling. No blood thinners.

## 2018-09-01 NOTE — Discharge Instructions (Addendum)
You were seen in the ER today for a head injury.  Your physical exam was reassuring.  Follow-up with your primary care provider next 3 days.  Return to the ER for vomiting, headache, change in vision, numbness, weakness, confusion, passing out, seizure activity, or any other concerns.

## 2018-09-01 NOTE — ED Notes (Signed)
Pt verbalizes understanding of d/c instructions. Pt ambulatory at d/c with all belongings and with family.   

## 2018-09-01 NOTE — ED Provider Notes (Signed)
[facility] EMERGENCY DEPARTMENT Provider Note   CSN: [id number] Arrival date & time: 09/01/18  1959     History   Chief Complaint Chief Complaint  Patient presents with  . Fall    HPI Brinda Brandy is a 72 y.o. female with a hx of hyperlipidemia & HTN who presents to the ED with chief complaint of fall which occurred at 1900 tonight. Patient states that she was ambulating, slipped on a plastic bag, and fell forward into a wall. She states she struck the R anterior portion of her head on the wall. She did not lose consciousness or fall to the ground. She states initially head was painful w/ "swimminess" more so dizziness which was brief in duration. These sxs have resolved. No pain at present. No other injuries. Denies change in vision, numbness, tingling, weakness, neck pain, back pain, nausea, vomiting, or seizure activity. She is not on blood thinners. Denies prodromal lightheadedness/dizziness/chestpain/dyspnea. Son at bedside states she has been acting at baseline.  HPI  Past Medical History:  Diagnosis Date  . Hypertension   . Mixed hyperlipidemia 10/19/2017  . Patellofemoral arthritis of left knee 07/08/2016    Patient Active Problem List   Diagnosis Date Noted  . Mixed hyperlipidemia 10/19/2017  . Hyperglycemia 10/19/2017  . Patellofemoral arthritis of left knee 07/08/2016  . It band syndrome, left 06/15/2016  . Essential hypertension, benign 05/31/2016  . Leg pain, lateral 05/31/2016  . Varicose veins of left lower extremity with pain 05/31/2016  . Onychomycosis 10/10/2013  . Porokeratosis 10/10/2013    History reviewed. No pertinent surgical history.   OB History   No obstetric history on file.      Home Medications    Prior to Admission medications   Medication Sig Start Date End Date Taking? Authorizing Provider  amLODipine (NORVASC) 5 MG tablet Take 1 tablet (5 mg total) by mouth at bedtime. Need Office Visit for further refill  10/19/17   Nche, Bonna Gains, NP  diclofenac sodium (VOLTAREN) 1 % GEL Apply 2 g topically 3 (three) times daily as needed. Patient taking differently: Apply 2 g topically 3 (three) times daily as needed (pain).  04/18/18   Nche, Bonna Gains, NP  gabapentin (NEURONTIN) 300 MG capsule Take 1 capsule (300 mg total) by mouth at bedtime. 10/19/17   Nche, Bonna Gains, NP  hydrochlorothiazide (HYDRODIURIL) 25 MG tablet Take 1 tablet (25 mg total) by mouth every morning. 11/02/17   Quintella Reichert, MD  meclizine (ANTIVERT) 12.5 MG tablet Take 1 tablet (12.5 mg total) by mouth 3 (three) times daily as needed for dizziness. 08/27/18   Charlynne Pander, MD    Family History Family History  Problem Relation Age of Onset  . Diabetes Mother   . Heart attack Mother   . Cancer Sister   . Heart attack Brother     Social History Social History   Tobacco Use  . Smoking status: Never Smoker  . Smokeless tobacco: Never Used  Substance Use Topics  . Alcohol use: No  . Drug use: No     Allergies   Patient has no known allergies.   Review of Systems Review of Systems  Constitutional: Negative for chills and fever.  Eyes: Negative for visual disturbance.  Respiratory: Negative for shortness of breath.   Cardiovascular: Negative for chest pain.  Gastrointestinal: Negative for abdominal pain, nausea and vomiting.  Neurological: Positive for dizziness (resolved at present) and headaches (resolved at present). Negative for seizures, syncope,  weakness and numbness.  All other systems reviewed and are negative.    Physical Exam Updated Vital Signs BP 129/71 (BP Location: Right Arm)   Pulse 88   Temp 97.7 F (36.5 C) (Oral)   Resp 18   SpO2 100%   Physical Exam Vitals signs and nursing note reviewed.  Constitutional:      General: She is not in acute distress.    Appearance: She is well-developed. She is not toxic-appearing.  HENT:     Head: Normocephalic and atraumatic. No raccoon eyes  or Battle's sign.     Right Ear: Tympanic membrane normal.     Left Ear: Tympanic membrane normal.     Nose: Nose normal.     Mouth/Throat:     Mouth: Mucous membranes are moist.     Pharynx: Uvula midline.  Eyes:     General:        Right eye: No discharge.        Left eye: No discharge.     Extraocular Movements: Extraocular movements intact.     Conjunctiva/sclera: Conjunctivae normal.     Comments: PERRL.   Neck:     Musculoskeletal: Normal range of motion and neck supple. No edema, erythema or neck rigidity.  Cardiovascular:     Rate and Rhythm: Normal rate and regular rhythm.  Pulmonary:     Effort: Pulmonary effort is normal. No respiratory distress.     Breath sounds: Normal breath sounds. No wheezing, rhonchi or rales.  Abdominal:     General: There is no distension.     Palpations: Abdomen is soft.     Tenderness: There is no abdominal tenderness.  Musculoskeletal:     Comments: NO midline spinal tenderness. No palpable step off.  No bony tenderness to upper/lower extremities  Skin:    General: Skin is warm and dry.     Findings: No rash.  Neurological:     Mental Status: She is alert.     Comments: Clear speech. CN III-XII grossly intact. Sensation grossly intact x 4. 5/5 symmetric grip strength. 5/5 strength with plantar/dorsiflexion. Normal finger to nose. Negative pronator drift. Ambulatory   Psychiatric:        Behavior: Behavior normal.      ED Treatments / Results  Labs (all labs ordered are listed, but only abnormal results are displayed) Labs Reviewed - No data to display  EKG None  Radiology No results found.  Procedures Procedures (including critical care time)  Medications Ordered in ED Medications - No data to display   Initial Impression / Assessment and Plan / ED Course  I have reviewed the triage vital signs and the nursing notes.  Pertinent labs & imaging results that were available during my care of the patient were reviewed by  me and considered in my medical decision making (see chart for details).  Clinical Course as of Sep 01 2306  Fri Sep 01, 2018  [id number] 72 year old female with a minor slip today striking her head on the wall.  She had some swelling there.  She is not on anticoagulation.  She had some initial lightheadedness but that seems to be improving.  She otherwise appears to have no deficits.   [MB]    Clinical Course User Index [MB] Terrilee FilesButler, Michael C, MD    Patient presents to the ED status post mechanical fall with head injury.  She did not fall all the way to the ground.  She did not have loss of consciousness.  She is not on anticoagulation.  She is currently completely asymptomatic without complaints and per her family at bedside is at her mental status baseline.  No acute neurologic deficits or midline tenderness to the spine, doubt serious head, neck, or back injury.  Patient is well-appearing, mild mechanism, seems safe for discharge home.  We discussed option of CT scan, patient and the son both did not feel this was necessary and I am in agreement. I discussed treatment plan, need for follow-up, and return precautions with the patient & family. Provided opportunity for questions, patient & family confirmed understanding and are in agreement with plan.   Findings and plan of care discussed with supervising physician Dr. Charm BargesButler who personally evaluated and examined this patient and is in agreement.   Final Clinical Impressions(s) / ED Diagnoses   Final diagnoses:  Fall, initial encounter    ED Discharge Orders    None       Desmond Lopeetrucelli, Olivia Royse R, PA-C 09/01/18 2339    Terrilee FilesButler, Michael C, MD 09/02/18 [phone]

## 2018-09-19 ENCOUNTER — Ambulatory Visit (INDEPENDENT_AMBULATORY_CARE_PROVIDER_SITE_OTHER): Payer: Medicare HMO | Admitting: Neurology

## 2018-09-19 ENCOUNTER — Encounter: Payer: Self-pay | Admitting: Neurology

## 2018-09-19 VITALS — BP 117/71 | HR 96 | Ht 65.0 in | Wt 143.0 lb

## 2018-09-19 DIAGNOSIS — R42 Dizziness and giddiness: Secondary | ICD-10-CM

## 2018-09-19 DIAGNOSIS — R930 Abnormal findings on diagnostic imaging of skull and head, not elsewhere classified: Secondary | ICD-10-CM

## 2018-09-19 DIAGNOSIS — I951 Orthostatic hypotension: Secondary | ICD-10-CM

## 2018-09-19 DIAGNOSIS — H81399 Other peripheral vertigo, unspecified ear: Secondary | ICD-10-CM

## 2018-09-19 NOTE — Progress Notes (Signed)
Subjective:    Patient ID: Ebony Mayer is a 73 y.o. female.  HPI     Huston Foley, MD, PhD [facility] [address] [address] [place], [place] [address]  I saw patient, Ebony Mayer, as a referral from the emergency room. The patient is accompanied by her daughter today. She is a 58 year old right-handed woman with an underlying medical history of hypertension, and knee arthritis, who presented to the emergency room on 08/27/2018 with sudden onset of dizziness that started in the morning upon awakening. She had symptoms for about 10 minutes and had a sensation of room spinning at the time. She was suspected to have dehydration and/or orthostasis. She was treated symptomatically with IV fluids. She had a head CT without contrast on 08/27/2018 and I reviewed the results: IMPRESSION: 1. No acute intracranial process. 2. Suspected old left cerebellar hemisphere infarct. She had blood work on 08/27/2018 including urinalysis, CBC with differential, CMP, and troponin, all unremarkable. She was given a prescription for meclizine as needed. She also presented to the emergency room on 09/01/2018 after she slipped at home and fell, striking her head against the wall. She did not lose consciousness and no head CT was done at the time. She denies any current symptoms of dizziness, vertigo, or lightheadedness. She is a nonsmoker and does not utilize alcohol, does not drink caffeine on a day-to-day basis. She is retired, lives with her daughter and daughter's 3 kids.  She tries to hydrate with water, estimates she drinks about 3-4 cups of water. She has lost weight within the last few weeks. Has talked to PCP about it. She had a mammogram in May 2019 which was benign. She has not had a colonoscopy, no chest x-ray, she has no history of peptic ulcer disease, no family history of stroke, no symptoms of one-sided weakness or numbness or slurring of speech or droopy face.  Non-smoker, no EtOH.   Her Past Medical History Is Significant For: Past Medical History:  Diagnosis Date  . Hypertension   . Mixed hyperlipidemia 10/19/2017  . Patellofemoral arthritis of left knee 07/08/2016    Her Past Surgical History Is Significant For: No past surgical history on file.  Her Family History Is Significant For: Family History  Problem Relation Age of Onset  . Diabetes Mother   . Heart attack Mother   . Cancer Sister   . Heart attack Brother     Her Social History Is Significant For: Social History   Socioeconomic History  . Marital status: Single    Spouse name: Not on file  . Number of children: Not on file  . Years of education: Not on file  . Highest education level: Not on file  Occupational History  . Not on file  Social Needs  . Financial resource strain: Not on file  . Food insecurity:    Worry: Not on file    Inability: Not on file  . Transportation needs:    Medical: Not on file    Non-medical: Not on file  Tobacco Use  . Smoking status: Never Smoker  . Smokeless tobacco: Never Used  Substance and Sexual Activity  . Alcohol use: No  . Drug use: No  . Sexual activity: Not Currently  Lifestyle  . Physical activity:    Days per week: Not on file    Minutes per session: Not on file  . Stress: Not on file  Relationships  . Social connections:    Talks  on phone: Not on file    Gets together: Not on file    Attends religious service: Not on file    Active member of club or organization: Not on file    Attends meetings of clubs or organizations: Not on file    Relationship status: Not on file  Other Topics Concern  . Not on file  Social History Narrative  . Not on file    Her Allergies Are:  No Known Allergies:   Her Current Medications Are:  Outpatient Encounter Medications as of 09/19/2018  Medication Sig  . amLODipine (NORVASC) 5 MG tablet Take 1 tablet (5 mg total) by mouth at bedtime. Need Office Visit for further refill   . diclofenac sodium (VOLTAREN) 1 % GEL Apply 2 g topically 3 (three) times daily as needed. (Patient taking differently: Apply 2 g topically 3 (three) times daily as needed (pain). )  . gabapentin (NEURONTIN) 300 MG capsule Take 1 capsule (300 mg total) by mouth at bedtime.  . hydrochlorothiazide (HYDRODIURIL) 25 MG tablet Take 1 tablet (25 mg total) by mouth every morning.  . [DISCONTINUED] meclizine (ANTIVERT) 12.5 MG tablet Take 1 tablet (12.5 mg total) by mouth 3 (three) times daily as needed for dizziness.   No facility-administered encounter medications on file as of 09/19/2018.   :   Review of Systems:  Out of a complete 14 point review of systems, all are reviewed and negative with the exception of these symptoms as listed below:  Review of Systems  Neurological:       Pt presents today to discuss her dizziness. Pt reports that she was dizzy and went to the ER where she was told that she had an old stroke noted on her scans. Pt denies dizziness since her ER visit.    Objective:  Neurological Exam  Physical Exam Physical Examination:   Vitals:   09/19/18 1440  BP: 117/71  Pulse: 96   On orthostatic testing: Lying blood pressure and pulse was 130/80 with a pulse of 100, sitting 117/71 with a pulse of 96, standing 110/71 with a pulse of 97. She did not have any orthostatic significant symptoms of dizziness or lightheadedness or vertiginous symptoms.  General Examination: The patient is a very pleasant 73 y.o. female in no acute distress. She appears thin, well groomed.    HEENT: Normocephalic, atraumatic, pupils are equal, round and reactive to light and accommodation. Extraocular tracking is good without limitation to gaze excursion or nystagmus noted. Normal smooth pursuit is noted. Hearing is grossly intact. Tympanic membranes are clear bilaterally. Face is symmetric with normal facial animation and normal facial sensation. Speech is clear with no dysarthria noted. There is no  hypophonia. There is no lip, neck/head, jaw or voice tremor. Neck is supple with full range of passive and active motion. There are no carotid bruits on auscultation. Oropharynx exam reveals: moderate mouth dryness, adequate dental hygiene with near-edentulous state. Mallampati is class I. Tongue protrudes centrally and palate elevates symmetrically.  Chest: Clear to auscultation without wheezing, rhonchi or crackles noted.  Heart: S1+S2+0, regular and normal without murmurs, rubs or gallops noted.   Abdomen: Soft, non-tender and non-distended with normal bowel sounds appreciated on auscultation.  Extremities: There is no pitting edema in the distal lower extremities bilaterally. Pedal pulses are intact.  Skin: Warm and dry without trophic changes noted.  Musculoskeletal: exam reveals no obvious joint deformities, tenderness or joint swelling or erythema.   Neurologically:  Mental status: The patient is  awake, alert and oriented in all 4 spheres. Her immediate and remote memory, attention, language skills and fund of knowledge are appropriate. There is no evidence of aphasia, agnosia, apraxia or anomia. Speech is clear with normal prosody and enunciation. Thought process is linear. Mood is normal and affect is normal.  Cranial nerves II - XII are as described above under HEENT exam. In addition: shoulder shrug is normal with equal shoulder height noted. Motor exam: thin bulk, strength and tone is noted. There is no drift, tremor or rebound. Romberg is negative. Reflexes are 2+ throughout. Fine motor skills and coordination: intact with normal finger taps, normal hand movements, normal rapid alternating patting, normal foot taps and normal foot agility.  Cerebellar testing: No dysmetria or intention tremor on finger to nose testing. Heel to shin is unremarkable bilaterally. There is no truncal or gait ataxia.  Sensory exam: intact to light touch, vibration, and temperature in the upper and lower  extremities.  Gait, station and balance: She stands easily. No veering to one side is noted. No leaning to one side is noted. Posture is age-appropriate and stance is narrow based. Gait shows normal stride length and normal pace. No problems turning are noted. Tandem walk is unremarkable.      Assessment and Plan:   In summary, Ebony Mayer is a very pleasant 73 y.o.-year old female with an underlying medical history of hypertension, hyperlipidemia, and knee arthritis, who presents for evaluation of her dizziness. On examination, she has a nonfocal neurological exam and currently is without any dizzy spell or vertiginous symptoms. She was treated for dehydration and orthostasis in the emergency room last month. She is advised to stay better hydrated with water and change positions slowly, she did have a 20 point systolic blood pressure dropped today on examination but not with any significant associated symptoms today. She is endorsing appetite loss and weight loss and daughter reports that she had a fairly drastic weight loss in the past few weeks of 20 pounds. She is advised to follow-up with her primary care provider regarding this and workup for weight loss and workup for even malignancy that can cause rapid weight loss, she may need further testing for this. She is advised to increase her caloric intake and monitor her weights and drink more water. She is furthermore advised for diagnostic clarification that I would like to proceed with a brain MRI with and without contrast as there was a suspected cerebellar old stroke noticed on her noncontrast head CT when she was in the emergency room. She has no history of sudden onset of strokelike symptoms. She has very little stroke risk factors, no family history of stroke, no significant hyperlipidemia, she is on blood pressure medicine. She is advised that if tolerated she could for prevention start taking a baby aspirin. She is advised to follow-up routinely  with her primary care physician especially in light of her weight loss. We will call her with her brain MRI results to keep her posted and as of right now I can see her back on an as-needed basis. I answered all their questions today and the patient and her daughter were in agreement.  Huston FoleySaima Kupono Marling, MD, PhD

## 2018-09-19 NOTE — Patient Instructions (Signed)
Your exam is good, no neurological abnormalities, you do have a mild drop in your systolic blood pressure (the top number) of 20 points between, lying and standing, which can be the cause for dizziness or lightheadedness.  You may have had an old small stroke in the left cerebellum, which is the "small brain". You have very little/few risk factors for stroke.  Continue exercising regularly and take your medications as directed. As discussed, secondary prevention is key after a stroke. This means: taking care of blood sugar values or diabetes management (A1c goal of less than 7.0), good blood pressure (hypertension) control and optimizing cholesterol management (with LDL goal of less than 70), exercising daily or regularly within your own mobility limitations of course, and overall cardiovascular risk factor reduction.  Please follow up with primary care as far as the reason for your recent weight loss, you may need further work up for this, like a chest X ray, colonoscopy, etc.  If you can tolerate it, start taking a baby aspirin daily, 81 mg.  Please hydrate better with water, about 6-8 cups.  Add more calories and healthy nutrition.  For better clarification of the abnormality seen on your CT head, we will do a brain scan, called MRI and call you with the test results. We will have to schedule you for this on a separate date. This test requires authorization from your insurance, and we will take care of the insurance process.

## 2018-09-20 ENCOUNTER — Telehealth: Payer: Self-pay | Admitting: Neurology

## 2018-09-20 NOTE — Telephone Encounter (Signed)
lvm for pt to be aware I left GI phone number of [phone] and to give them a call if she has not heard in the next 2-3 business days °

## 2018-09-20 NOTE — Telephone Encounter (Signed)
Francine Graven Berkley Harvey: [id number] (exp. 09/20/18 to 10/20/18) order sent to GI. They will reach out to the pt to schedule.

## 2018-10-03 ENCOUNTER — Ambulatory Visit
Admission: RE | Admit: 2018-10-03 | Discharge: 2018-10-03 | Disposition: A | Discharge status: Home / Self Care | Payer: Medicare HMO | Source: Ambulatory Visit | Attending: Neurology | Admitting: Neurology

## 2018-10-03 DIAGNOSIS — I951 Orthostatic hypotension: Secondary | ICD-10-CM

## 2018-10-03 DIAGNOSIS — H81399 Other peripheral vertigo, unspecified ear: Secondary | ICD-10-CM

## 2018-10-03 DIAGNOSIS — R930 Abnormal findings on diagnostic imaging of skull and head, not elsewhere classified: Secondary | ICD-10-CM

## 2018-10-03 DIAGNOSIS — R42 Dizziness and giddiness: Secondary | ICD-10-CM | POA: Diagnosis not present

## 2018-10-03 MED ORDER — GADOBENATE DIMEGLUMINE 529 MG/ML IV SOLN
13.0000 mL | Freq: Once | INTRAVENOUS | Status: AC | PRN
  Administered 2018-10-03: 13 mL via INTRAVENOUS

## 2018-10-04 NOTE — Progress Notes (Signed)
Pls call pt:  MRI brain with and without contrast showed no acute findings, some age-related changes, no evidence of old stroke. She does have some inflammation in her mastoid air cells which is the bony airspace behind the ears bilaterally, right more than left. This can be a sign of sinus disease. If she has any congestion, she may benefit from seeing her primary care or even an ear nose throat specialist for this. Again, no acute findings from the neurological standpoint, no evidence of old stroke, can follow-up with me on an as-needed basis. Janene Harveysa

## 2018-10-05 ENCOUNTER — Telehealth: Payer: Self-pay

## 2018-10-05 NOTE — Telephone Encounter (Signed)
-----   Message from Huston Foley, MD sent at 10/04/2018  5:01 PM EST ----- Pls call pt:  MRI brain with and without contrast showed no acute findings, some age-related changes, no evidence of old stroke. She does have some inflammation in her mastoid air cells which is the bony airspace behind the ears bilaterally, right more than left. This can be a sign of sinus disease. If she has any congestion, she may benefit from seeing her primary care or even an ear nose throat specialist for this. Again, no acute findings from the neurological standpoint, no evidence of old stroke, can follow-up with me on an as-needed basis. Janene Harvey

## 2018-10-05 NOTE — Telephone Encounter (Signed)
I called pt to discuss her MRI results. No answer, left a message asking her to call me back. 

## 2018-10-05 NOTE — Telephone Encounter (Signed)
Pt returned my call. I discussed her MRI results. Pt denies any congestion but will discuss these results further with her PCP next month. Pt verbalized understanding of results. Pt had no questions at this time but was encouraged to call back if questions arise.

## 2018-10-17 ENCOUNTER — Ambulatory Visit: Payer: Self-pay | Admitting: Neurology

## 2018-10-20 ENCOUNTER — Other Ambulatory Visit: Payer: Self-pay | Admitting: Nurse Practitioner

## 2018-10-20 DIAGNOSIS — M79605 Pain in left leg: Secondary | ICD-10-CM

## 2018-10-20 DIAGNOSIS — M7632 Iliotibial band syndrome, left leg: Secondary | ICD-10-CM

## 2018-11-07 NOTE — Progress Notes (Signed)
Subjective:   Ebony Mayer is a 73 y.o. female who presents for an Initial Medicare Annual Wellness Visit.  The Patient was informed that the wellness visit is to identify future health risk and educate and initiate measures that can reduce risk for increased disease through the lifespan.   Describes health as fair, good or great? good   Review of Systems   No ROS.  Medicare Wellness Visit. Additional risk factors are reflected in the social history. Cardiac Risk Factors include: advanced age (>27men, >47 women);dyslipidemia;hypertension Sleep patterns: no issues.  Home Safety/Smoke Alarms: Feels safe in home. Smoke alarms in place.  Lives with daughter and 3 grand kids. 2 story home. No issue with stairs. Step over tub.   Female:   Mammo-  01/25/18     Dexa scan- ordered       [facility]- Cologuard 04/18/18-neg     Objective:    Today's Vitals   11/08/18 0926  BP: 120/64  Pulse: 90  SpO2: 98%  Weight: 140 lb (63.5 kg)  Height:  (1.651 m)   Body mass index is 23.3 kg/m.  Advanced Directives 11/08/2018 09/01/2018 08/27/2018 05/25/2016 04/29/2016 04/27/2016  Does Patient Have a Medical Advance Directive? No No No No No No  Would patient like information on creating a medical advance directive? No - Patient declined No - Patient declined No - Patient declined No - patient declined information - -    Current Medications (verified) Outpatient Encounter Medications as of 11/08/2018  Medication Sig  . amLODipine (NORVASC) 5 MG tablet Take 1 tablet (5 mg total) by mouth at bedtime. Need Office Visit for further refill  . diclofenac sodium (VOLTAREN) 1 % GEL Apply 2 g topically 3 (three) times daily as needed. (Patient taking differently: Apply 2 g topically 3 (three) times daily as needed (pain). )  . gabapentin (NEURONTIN) 300 MG capsule Take 1 capsule (300 mg total) by mouth at bedtime. Needs office visit for additional refill  . hydrochlorothiazide (HYDRODIURIL) 25 MG tablet Take  1 tablet (25 mg total) by mouth every morning.   No facility-administered encounter medications on file as of 11/08/2018.     Allergies (verified) Patient has no known allergies.   History: Past Medical History:  Diagnosis Date  . Hypertension   . Mixed hyperlipidemia 10/19/2017  . Patellofemoral arthritis of left knee 07/08/2016   History reviewed. No pertinent surgical history. Family History  Problem Relation Age of Onset  . Diabetes Mother   . Heart attack Mother   . Cancer Sister   . Heart attack Brother    Social History   Socioeconomic History  . Marital status: Single    Spouse name: Not on file  . Number of children: Not on file  . Years of education: Not on file  . Highest education level: Not on file  Occupational History  . Not on file  Social Needs  . Financial resource strain: Not on file  . Food insecurity:    Worry: Not on file    Inability: Not on file  . Transportation needs:    Medical: Not on file    Non-medical: Not on file  Tobacco Use  . Smoking status: Never Smoker  . Smokeless tobacco: Never Used  Substance and Sexual Activity  . Alcohol use: No  . Drug use: No  . Sexual activity: Not Currently  Lifestyle  . Physical activity:    Days per week: Not on file    Minutes per session:  Not on file  . Stress: Not on file  Relationships  . Social connections:    Talks on phone: Not on file    Gets together: Not on file    Attends religious service: Not on file    Active member of club or organization: Not on file    Attends meetings of clubs or organizations: Not on file    Relationship status: Not on file  Other Topics Concern  . Not on file  Social History Narrative  . Not on file    Tobacco Counseling Counseling given: Not Answered   Clinical Intake: Pain : No/denies pain    Activities of Daily Living In your present state of health, do you have any difficulty performing the following activities: 11/08/2018  Hearing? N    Vision? N  Difficulty concentrating or making decisions? N  Walking or climbing stairs? N  Dressing or bathing? N  Doing errands, shopping? Y  Comment daughter drives her.  Preparing Food and eating ? N  Using the Toilet? N  In the past six months, have you accidently leaked urine? N  Do you have problems with loss of bowel control? N  Managing your Medications? N  Managing your Finances? N  Housekeeping or managing your Housekeeping? N  Some recent data might be hidden     Immunizations and Health Maintenance  There is no immunization history on file for this patient. Health Maintenance Due  Topic Date Due  . TETANUS/TDAP  04/08/1966    Patient Care Team: Nche, Bonna Gains, NP as PCP - General (Internal Medicine)  Indicate any recent Medical Services you may have received from other than [facility] providers in the past year (date may be approximate).     Assessment:   This is a routine wellness examination for Ebony Mayer. Physical assessment deferred to PCP.  Hearing/Vision screen  Hearing Screening   125Hz  250Hz  500Hz  1000Hz  2000Hz  3000Hz  4000Hz  6000Hz  8000Hz   Right ear:   Pass Pass Pass  Pass    Left ear:   Pass Pass Pass  Pass      Visual Acuity Screening   Right eye Left eye Both eyes  Without correction: 20/25 20/25 20/25   With correction:       Dietary issues and exercise activities discussed: Current Exercise Habits: Home exercise routine, Type of exercise: walking, Frequency (Times/Week): 3, Exercise limited by: None identified Diet (meal preparation, eat out, water intake, caffeinated beverages, dairy products, fruits and vegetables): well balanced, on average, 2 meals per day   Goals    . do more word puzzles    . Increase physical activity      Depression Screen PHQ 2/9 Scores 11/08/2018 10/19/2017 05/25/2016  PHQ - 2 Score 0 0 0    Fall Risk Fall Risk  11/08/2018 10/19/2017  Falls in the past year? 0 No     Cognitive Function: MMSE - Mini Mental  State Exam 11/08/2018  Orientation to time 5  Orientation to Place 5  Registration 3  Attention/ Calculation 5  Attention/Calculation-comments pt does not do well with spelling or math  Recall 0  Language- name 2 objects 2  Language- repeat 1  Language- follow 3 step command 3  Language- read & follow direction 1  Write a sentence 0  Copy design 0  Total score 25        Screening Tests Health Maintenance  Topic Date Due  . TETANUS/TDAP  04/08/1966  . INFLUENZA VACCINE  12/12/2018 (Originally 04/13/2018)  .  PNA vac Low Risk Adult (1 of 2 - PCV13) 09/01/2019 (Originally 04/08/2012)  . MAMMOGRAM  01/26/2020  . Fecal DNA (Cologuard)  05/17/2021  . DEXA SCAN  Completed  . Hepatitis C Screening  Completed     Plan:    Please schedule your next medicare wellness visit with me in 1 yr.  Continue to eat heart healthy diet (full of fruits, vegetables, whole grains, lean protein, water--limit salt, fat, and sugar intake) and increase physical activity as tolerated.  Continue doing brain stimulating activities (puzzles, reading, adult coloring books, staying active) to keep memory sharp.   I have ordered your bone density scan. Please schedule. I have personally reviewed and noted the following in the patient's chart:   . Medical and social history . Use of alcohol, tobacco or illicit drugs  . Current medications and supplements . Functional ability and status . Nutritional status . Physical activity . Advanced directives . List of other physicians . Hospitalizations, surgeries, and ER visits in previous 12 months . Vitals . Screenings to include cognitive, depression, and falls . Referrals and appointments  In addition, I have reviewed and discussed with patient certain preventive protocols, quality metrics, and best practice recommendations. A written personalized care plan for preventive services as well as general preventive health recommendations were provided to patient.       Mady Haagensen [place], [place]   11/08/2018

## 2018-11-08 ENCOUNTER — Encounter: Payer: Self-pay | Admitting: Nurse Practitioner

## 2018-11-08 ENCOUNTER — Encounter: Payer: Self-pay | Admitting: *Deleted

## 2018-11-08 ENCOUNTER — Ambulatory Visit (INDEPENDENT_AMBULATORY_CARE_PROVIDER_SITE_OTHER): Payer: Medicare HMO | Admitting: *Deleted

## 2018-11-08 ENCOUNTER — Ambulatory Visit (INDEPENDENT_AMBULATORY_CARE_PROVIDER_SITE_OTHER): Payer: Medicare HMO | Admitting: Nurse Practitioner

## 2018-11-08 VITALS — BP 120/64 | HR 90 | Temp 97.8°F | Ht 65.0 in | Wt 140.0 lb

## 2018-11-08 VITALS — BP 120/64 | HR 90 | Ht 65.0 in | Wt 140.0 lb

## 2018-11-08 DIAGNOSIS — M79605 Pain in left leg: Secondary | ICD-10-CM | POA: Diagnosis not present

## 2018-11-08 DIAGNOSIS — M7632 Iliotibial band syndrome, left leg: Secondary | ICD-10-CM

## 2018-11-08 DIAGNOSIS — Z0000 Encounter for general adult medical examination without abnormal findings: Secondary | ICD-10-CM

## 2018-11-08 DIAGNOSIS — R739 Hyperglycemia, unspecified: Secondary | ICD-10-CM

## 2018-11-08 DIAGNOSIS — E782 Mixed hyperlipidemia: Secondary | ICD-10-CM

## 2018-11-08 DIAGNOSIS — I10 Essential (primary) hypertension: Secondary | ICD-10-CM

## 2018-11-08 DIAGNOSIS — Z780 Asymptomatic menopausal state: Secondary | ICD-10-CM

## 2018-11-08 LAB — CBC
HCT: 38.9 % (ref 36.0–46.0)
Hemoglobin: 13 g/dL (ref 12.0–15.0)
MCHC: 33.4 g/dL (ref 30.0–36.0)
MCV: 91.6 fl (ref 78.0–100.0)
PLATELETS: 167 10*3/uL (ref 150.0–400.0)
RBC: 4.25 Mil/uL (ref 3.87–5.11)
RDW: 13 % (ref 11.5–15.5)
WBC: 3.6 10*3/uL — ABNORMAL LOW (ref 4.0–10.5)

## 2018-11-08 LAB — COMPREHENSIVE METABOLIC PANEL
ALT: 9 U/L (ref 0–35)
AST: 13 U/L (ref 0–37)
Albumin: 4.2 g/dL (ref 3.5–5.2)
Alkaline Phosphatase: 50 U/L (ref 39–117)
BUN: 10 mg/dL (ref 6–23)
CO2: 31 mEq/L (ref 19–32)
Calcium: 9.5 mg/dL (ref 8.4–10.5)
Chloride: 103 mEq/L (ref 96–112)
Creatinine, Ser: 0.96 mg/dL (ref 0.40–1.20)
GFR: 69.21 mL/min (ref >60.00)
Glucose, Bld: 98 mg/dL (ref 70–99)
Potassium: 3.5 mEq/L (ref 3.5–5.1)
Sodium: 142 mEq/L (ref 135–145)
Total Bilirubin: 0.6 mg/dL (ref 0.2–1.2)
Total Protein: 7 g/dL (ref 6.0–8.3)

## 2018-11-08 LAB — LIPID PANEL
Cholesterol: 143 mg/dL (ref 0–200)
HDL: 43.9 mg/dL (ref >39.00)
LDL CALC: 79 mg/dL (ref 0–99)
NonHDL: 99.37
Total CHOL/HDL Ratio: 3
Triglycerides: 101 mg/dL (ref 0.0–149.0)
VLDL: 20.2 mg/dL (ref 0.0–40.0)

## 2018-11-08 LAB — HEMOGLOBIN A1C: Hgb A1c MFr Bld: 5.2 % (ref 4.6–6.5)

## 2018-11-08 NOTE — Progress Notes (Signed)
Subjective:  Patient ID: Ebony Mayer, female    DOB: [birth year]  Age: 73 y.o. MRN: [id number]  CC: Follow-up (6 mo fu/fasting/denied tdap)  HPI  AWV completed today by wellness Coach.  HTN:  controlled with amlodipine. BP Readings from Last 3 Encounters:  11/08/18 120/64  11/08/18 120/64  09/19/18 117/71   Hyperlipidemia: Previous LDL 87, triglyceride 162, HDL 37, TC 157. Needs repeat today. Reports compliance with DASH diet   Reviewed past Medical, Social and Family history today.  Outpatient Medications Prior to Visit  Medication Sig Dispense Refill  . amLODipine (NORVASC) 5 MG tablet Take 1 tablet (5 mg total) by mouth at bedtime. Need Office Visit for further refill 90 tablet 3  . diclofenac sodium (VOLTAREN) 1 % GEL Apply 2 g topically 3 (three) times daily as needed. (Patient taking differently: Apply 2 g topically 3 (three) times daily as needed (pain). ) 100 g 5  . gabapentin (NEURONTIN) 300 MG capsule Take 1 capsule (300 mg total) by mouth at bedtime. Needs office visit for additional refill 90 capsule 0  . hydrochlorothiazide (HYDRODIURIL) 25 MG tablet Take 1 tablet (25 mg total) by mouth every morning. 90 tablet 3   No facility-administered medications prior to visit.     ROS See HPI  Objective:  BP 120/64   Pulse 90   Temp 97.8 F (36.6 C) (Oral)   Ht 5\' 5"  (1.651 m)   Wt 140 lb (63.5 kg)   SpO2 98%   BMI 23.30 kg/m   BP Readings from Last 3 Encounters:  11/08/18 120/64  11/08/18 120/64  09/19/18 117/71    Wt Readings from Last 3 Encounters:  11/08/18 140 lb (63.5 kg)  11/08/18 140 lb (63.5 kg)  09/19/18 143 lb (64.9 kg)    Physical Exam Vitals signs reviewed.  HENT:     Right Ear: External ear normal.     Left Ear: External ear normal.     Mouth/Throat:     Mouth: Mucous membranes are moist.  Eyes:     Extraocular Movements: Extraocular movements intact.     Conjunctiva/sclera: Conjunctivae normal.     Pupils: Pupils are equal,  round, and reactive to light.  Neck:     Musculoskeletal: Normal range of motion and neck supple.     Thyroid: No thyroid mass, thyromegaly or thyroid tenderness.  Cardiovascular:     Rate and Rhythm: Normal rate and regular rhythm.     Pulses: Normal pulses.     Heart sounds: Normal heart sounds.  Pulmonary:     Effort: Pulmonary effort is normal.     Breath sounds: Normal breath sounds.  Chest:     Comments: Declined breast exam Abdominal:     General: Bowel sounds are normal.     Palpations: Abdomen is soft.  Musculoskeletal:     Right lower leg: No edema.     Left lower leg: No edema.  Lymphadenopathy:     Cervical: No cervical adenopathy.  Neurological:     Mental Status: She is alert and oriented to person, place, and time.  Psychiatric:        Mood and Affect: Mood normal.        Behavior: Behavior normal.        Thought Content: Thought content normal.     Lab Results  Component Value Date   WBC 4.0 08/27/2018   HGB 13.6 08/27/2018   HCT 43.2 08/27/2018   PLT 192 08/27/2018   GLUCOSE 116 (  H) 08/27/2018   CHOL 157 10/19/2017   TRIG 162.0 (H) 10/19/2017   HDL 37.60 (L) 10/19/2017   LDLCALC 87 10/19/2017   ALT 15 08/27/2018   AST 20 08/27/2018   NA 138 08/27/2018   K 3.6 08/27/2018   CL 100 08/27/2018   CREATININE 1.04 (H) 08/27/2018   BUN 14 08/27/2018   CO2 29 08/27/2018   TSH 1.02 05/31/2016   HGBA1C 5.4 10/19/2017    Mr Brain W ON Contrast  Result Date: 10/04/2018 [facility] NEUROIM[facility] REPORT STUDY DATE: 10/03/18 PATIENT NAME: Ebony Mayer DOB: Jun 25, 1946 MRN: [id number] ORDERING CLINICIAN: Huston Foley, MD PhD CLINICAL HISTORY: 73 year old female with dizziness. EXAM: MRI brain (with and without) TECHNIQUE: MRI of the brain with and without contrast was obtained utilizing 5 mm axial slices with T1, T2, T2 flair, SWI and diffusion weighted views.  T1 sagittal, T2 coronal and postcontrast views in the axial and coronal plane were  obtained. CONTRAST: 72ml multihance COMPARISON: 08/27/18 IMAGING SITE: [facility] [address] (1.5 Tesla MRI)  FINDINGS: No abnormal lesions are seen on diffusion-weighted views to suggest acute ischemia. The cortical sulci, fissures and cisterns are normal in size and appearance. Cavum septum pellucidum and cavum vergae variants. Lateral, third and fourth ventricle are normal in size and appearance. No extra-axial fluid collections are seen. No evidence of mass effect or midline shift.  Scattered periventricular and subcortical foci of non-specific T2 hyperintensities. No abnormal lesions are seen on post contrast views.  On sagittal views the posterior fossa, pituitary gland and corpus callosum are unremarkable. No evidence of intracranial hemorrhage on SWI views. The orbits and their contents, paranasal sinuses and calvarium are unremarkable. Non-specific fluid / inflammation in the right greater than left mastoid air cells. Intracranial flow voids are present.   MRI brain (with and without) demonstrating: - Scattered periventricular and subcortical foci of non-specific T2 hyperintensities. These findings are non-specific and considerations include autoimmune, inflammatory, post-infectious, microvascular ischemic or migraine associated etiologies. - Non-specific fluid / inflammation in the right greater than left mastoid air cells. INTERPRETING PHYSICIAN: Suanne Marker, MD Certified in Neurology, Neurophysiology and Neuroimaging [facility] [address], [address] [place], [place] [address] [phone]    Assessment & Plan:   Ebony Mayer was seen today for follow-up.  Diagnoses and all orders for this visit:  Essential hypertension, benign -     Comprehensive metabolic panel -     CBC -     TSH  Hyperglycemia -     Hemoglobin A1c -     Comprehensive metabolic panel  Mixed hyperlipidemia -     Lipid panel  Other orders -     Cancel: Vitamin D 1,25  dihydroxy   I am having Ebony Mayer maintain her amLODipine, hydrochlorothiazide, diclofenac sodium, and gabapentin.  No orders of the defined types were placed in this encounter.   Problem List Items Addressed This Visit      Cardiovascular and Mediastinum   Essential hypertension, benign - Primary   Relevant Orders   Comprehensive metabolic panel   CBC   TSH     Other   Hyperglycemia   Relevant Orders   Hemoglobin A1c   Comprehensive metabolic panel   Mixed hyperlipidemia   Relevant Orders   Lipid panel       Follow-up: Return in about 6 months (around 05/09/2019) for HTN and , hyperlipidemia.  [name], NP

## 2018-11-08 NOTE — Patient Instructions (Addendum)
Go to lab for blood draw.  Schedule appt for Bone density and Mammogram. Mammogram due 01/2019.  Health Maintenance, Female Adopting a healthy lifestyle and getting preventive care can go a long way to promote health and wellness. Talk with your health care provider about what schedule of regular examinations is right for you. This is a good chance for you to check in with your provider about disease prevention and staying healthy. In between checkups, there are plenty of things you can do on your own. Experts have done a lot of research about which lifestyle changes and preventive measures are most likely to keep you healthy. Ask your health care provider for more information. Weight and diet Eat a healthy diet  Be sure to include plenty of vegetables, fruits, low-fat dairy products, and lean protein.  Do not eat a lot of foods high in solid fats, added sugars, or salt.  Get regular exercise. This is one of the most important things you can do for your health. ? Most adults should exercise for at least 150 minutes each week. The exercise should increase your heart rate and make you sweat (moderate-intensity exercise). ? Most adults should also do strengthening exercises at least twice a week. This is in addition to the moderate-intensity exercise. Maintain a healthy weight  Body mass index (BMI) is a measurement that can be used to identify possible weight problems. It estimates body fat based on height and weight. Your health care provider can help determine your BMI and help you achieve or maintain a healthy weight.  For females 46 years of age and older: ? A BMI below 18.5 is considered underweight. ? A BMI of 18.5 to 24.9 is normal. ? A BMI of 25 to 29.9 is considered overweight. ? A BMI of 30 and above is considered obese. Watch levels of cholesterol and blood lipids  You should start having your blood tested for lipids and cholesterol at 72 years of age, then have this test every  5 years.  You may need to have your cholesterol levels checked more often if: ? Your lipid or cholesterol levels are high. ? You are older than 72 years of age. ? You are at high risk for heart disease. Cancer screening Lung Cancer  Lung cancer screening is recommended for adults 35-5 years old who are at high risk for lung cancer because of a history of smoking.  A yearly low-dose CT scan of the lungs is recommended for people who: ? Currently smoke. ? Have quit within the past 15 years. ? Have at least a 30-pack-year history of smoking. A pack year is smoking an average of one pack of cigarettes a day for 1 year.  Yearly screening should continue until it has been 15 years since you quit.  Yearly screening should stop if you develop a health problem that would prevent you from having lung cancer treatment. Breast Cancer  Practice breast self-awareness. This means understanding how your breasts normally appear and feel.  It also means doing regular breast self-exams. Let your health care provider know about any changes, no matter how small.  If you are in your 20s or 30s, you should have a clinical breast exam (CBE) by a health care provider every 1-3 years as part of a regular health exam.  If you are 81 or older, have a CBE every year. Also consider having a breast X-ray (mammogram) every year.  If you have a family history of breast cancer, talk to  your health care provider about genetic screening.  If you are at high risk for breast cancer, talk to your health care provider about having an MRI and a mammogram every year.  Breast cancer gene (BRCA) assessment is recommended for women who have family members with BRCA-related cancers. BRCA-related cancers include: ? Breast. ? Ovarian. ? Tubal. ? Peritoneal cancers.  Results of the assessment will determine the need for genetic counseling and BRCA1 and BRCA2 testing. Cervical Cancer Your health care provider may recommend  that you be screened regularly for cancer of the pelvic organs (ovaries, uterus, and vagina). This screening involves a pelvic examination, including checking for microscopic changes to the surface of your cervix (Pap test). You may be encouraged to have this screening done every 3 years, beginning at age 44.  For women ages 11-65, health care providers may recommend pelvic exams and Pap testing every 3 years, or they may recommend the Pap and pelvic exam, combined with testing for human papilloma virus (HPV), every 5 years. Some types of HPV increase your risk of cervical cancer. Testing for HPV may also be done on women of any age with unclear Pap test results.  Other health care providers may not recommend any screening for nonpregnant women who are considered low risk for pelvic cancer and who do not have symptoms. Ask your health care provider if a screening pelvic exam is right for you.  If you have had past treatment for cervical cancer or a condition that could lead to cancer, you need Pap tests and screening for cancer for at least 20 years after your treatment. If Pap tests have been discontinued, your risk factors (such as having a new sexual partner) need to be reassessed to determine if screening should resume. Some women have medical problems that increase the chance of getting cervical cancer. In these cases, your health care provider may recommend more frequent screening and Pap tests. Colorectal Cancer  This type of cancer can be detected and often prevented.  Routine colorectal cancer screening usually begins at 72 years of age and continues through 72 years of age.  Your health care provider may recommend screening at an earlier age if you have risk factors for colon cancer.  Your health care provider may also recommend using home test kits to check for hidden blood in the stool.  A small camera at the end of a tube can be used to examine your colon directly (sigmoidoscopy or  colonoscopy). This is done to check for the earliest forms of colorectal cancer.  Routine screening usually begins at age 60.  Direct examination of the colon should be repeated every 5-10 years through 72 years of age. However, you may need to be screened more often if early forms of precancerous polyps or small growths are found. Skin Cancer  Check your skin from head to toe regularly.  Tell your health care provider about any new moles or changes in moles, especially if there is a change in a mole's shape or color.  Also tell your health care provider if you have a mole that is larger than the size of a pencil eraser.  Always use sunscreen. Apply sunscreen liberally and repeatedly throughout the day.  Protect yourself by wearing long sleeves, pants, a wide-brimmed hat, and sunglasses whenever you are outside. Heart disease, diabetes, and high blood pressure  High blood pressure causes heart disease and increases the risk of stroke. High blood pressure is more likely to develop in: ?  People who have blood pressure in the high end of the normal range (130-139/85-89 mm Hg). ? People who are overweight or obese. ? People who are African American.  If you are 60-77 years of age, have your blood pressure checked every 3-5 years. If you are 21 years of age or older, have your blood pressure checked every year. You should have your blood pressure measured twice-once when you are at a hospital or clinic, and once when you are not at a hospital or clinic. Record the average of the two measurements. To check your blood pressure when you are not at a hospital or clinic, you can use: ? An automated blood pressure machine at a pharmacy. ? A home blood pressure monitor.  If you are between 43 years and 84 years old, ask your health care provider if you should take aspirin to prevent strokes.  Have regular diabetes screenings. This involves taking a blood sample to check your fasting blood sugar  level. ? If you are at a normal weight and have a low risk for diabetes, have this test once every three years after 72 years of age. ? If you are overweight and have a high risk for diabetes, consider being tested at a younger age or more often. Preventing infection Hepatitis B  If you have a higher risk for hepatitis B, you should be screened for this virus. You are considered at high risk for hepatitis B if: ? You were born in a country where hepatitis B is common. Ask your health care provider which countries are considered high risk. ? Your parents were born in a high-risk country, and you have not been immunized against hepatitis B (hepatitis B vaccine). ? You have HIV or AIDS. ? You use needles to inject street drugs. ? You live with someone who has hepatitis B. ? You have had sex with someone who has hepatitis B. ? You get hemodialysis treatment. ? You take certain medicines for conditions, including cancer, organ transplantation, and autoimmune conditions. Hepatitis C  Blood testing is recommended for: ? Everyone born from [birth year] through 1964. ? Anyone with known risk factors for hepatitis C. Sexually transmitted infections (STIs)  You should be screened for sexually transmitted infections (STIs) including gonorrhea and chlamydia if: ? You are sexually active and are younger than 72 years of age. ? You are older than 72 years of age and your health care provider tells you that you are at risk for this type of infection. ? Your sexual activity has changed since you were last screened and you are at an increased risk for chlamydia or gonorrhea. Ask your health care provider if you are at risk.  If you do not have HIV, but are at risk, it may be recommended that you take a prescription medicine daily to prevent HIV infection. This is called pre-exposure prophylaxis (PrEP). You are considered at risk if: ? You are sexually active and do not regularly use condoms or know the HIV status  of your partner(s). ? You take drugs by injection. ? You are sexually active with a partner who has HIV. Talk with your health care provider about whether you are at high risk of being infected with HIV. If you choose to begin PrEP, you should first be tested for HIV. You should then be tested every 3 months for as long as you are taking PrEP. Pregnancy  If you are premenopausal and you may become pregnant, ask your health care provider about  preconception counseling.  If you may become pregnant, take 400 to 800 micrograms (mcg) of folic acid every day.  If you want to prevent pregnancy, talk to your health care provider about birth control (contraception). Osteoporosis and menopause  Osteoporosis is a disease in which the bones lose minerals and strength with aging. This can result in serious bone fractures. Your risk for osteoporosis can be identified using a bone density scan.  If you are 45 years of age or older, or if you are at risk for osteoporosis and fractures, ask your health care provider if you should be screened.  Ask your health care provider whether you should take a calcium or vitamin D supplement to lower your risk for osteoporosis.  Menopause may have certain physical symptoms and risks.  Hormone replacement therapy may reduce some of these symptoms and risks. Talk to your health care provider about whether hormone replacement therapy is right for you. Follow these instructions at home:  Schedule regular health, dental, and eye exams.  Stay current with your immunizations.  Do not use any tobacco products including cigarettes, chewing tobacco, or electronic cigarettes.  If you are pregnant, do not drink alcohol.  If you are breastfeeding, limit how much and how often you drink alcohol.  Limit alcohol intake to no more than 1 drink per day for nonpregnant women. One drink equals 12 ounces of beer, 5 ounces of wine, or 1 ounces of hard liquor.  Do not use street  drugs.  Do not share needles.  Ask your health care provider for help if you need support or information about quitting drugs.  Tell your health care provider if you often feel depressed.  Tell your health care provider if you have ever been abused or do not feel safe at home. This information is not intended to replace advice given to you by your health care provider. Make sure you discuss any questions you have with your health care provider. Document Released: 03/15/2011 Document Revised: 02/05/2016 Document Reviewed: 06/03/2015 Elsevier Interactive Patient Education  2019 [employer].

## 2018-11-08 NOTE — Patient Instructions (Signed)
Please schedule your next medicare wellness visit with me in 1 yr.  Continue to eat heart healthy diet (full of fruits, vegetables, whole grains, lean protein, water--limit salt, fat, and sugar intake) and increase physical activity as tolerated.  Continue doing brain stimulating activities (puzzles, reading, adult coloring books, staying active) to keep memory sharp.   I have ordered your bone density scan. Please schedule.   [name] , Thank you for taking time to come for your Medicare Wellness Visit. I appreciate your ongoing commitment to your health goals. Please review the following plan we discussed and let me know if I can assist you in the future.   These are the goals we discussed: Goals    . do more word puzzles    . Increase physical activity       This is a list of the screening recommended for you and due dates:  Health Maintenance  Topic Date Due  . Tetanus Vaccine  04/08/1966  . Flu Shot  12/12/2018*  . Pneumonia vaccines (1 of 2 - PCV13) 09/01/2019*  . Mammogram  01/26/2020  . Cologuard (Stool DNA test)  05/17/2021  . DEXA scan (bone density measurement)  Completed  .  Hepatitis C: One time screening is recommended by Center for Disease Control  (CDC) for  adults born from [birth year] through 1964.   Completed  *Topic was postponed. The date shown is not the original due date.    Health Maintenance After Age 54 After age 32, you are at a higher risk for certain long-term diseases and infections as well as injuries from falls. Falls are a major cause of broken bones and head injuries in people who are older than age 72. Getting regular preventive care can help to keep you healthy and well. Preventive care includes getting regular testing and making lifestyle changes as recommended by your health care provider. Talk with your health care provider about:  Which screenings and tests you should have. A screening is a test that checks for a disease when you have no  symptoms.  A diet and exercise plan that is right for you. What should I know about screenings and tests to prevent falls? Screening and testing are the best ways to find a health problem early. Early diagnosis and treatment give you the best chance of managing medical conditions that are common after age 72. Certain conditions and lifestyle choices may make you more likely to have a fall. Your health care provider may recommend:  Regular vision checks. Poor vision and conditions such as cataracts can make you more likely to have a fall. If you wear glasses, make sure to get your prescription updated if your vision changes.  Medicine review. Work with your health care provider to regularly review all of the medicines you are taking, including over-the-counter medicines. Ask your health care provider about any side effects that may make you more likely to have a fall. Tell your health care provider if any medicines that you take make you feel dizzy or sleepy.  Osteoporosis screening. Osteoporosis is a condition that causes the bones to get weaker. This can make the bones weak and cause them to break more easily.  Blood pressure screening. Blood pressure changes and medicines to control blood pressure can make you feel dizzy.  Strength and balance checks. Your health care provider may recommend certain tests to check your strength and balance while standing, walking, or changing positions.  Foot health exam. Foot pain and numbness,  as well as not wearing proper footwear, can make you more likely to have a fall.  Depression screening. You may be more likely to have a fall if you have a fear of falling, feel emotionally low, or feel unable to do activities that you used to do.  Alcohol use screening. Using too much alcohol can affect your balance and may make you more likely to have a fall. What actions can I take to lower my risk of falls? General instructions  Talk with your health care  provider about your risks for falling. Tell your health care provider if: ? You fall. Be sure to tell your health care provider about all falls, even ones that seem minor. ? You feel dizzy, sleepy, or off-balance.  Take over-the-counter and prescription medicines only as told by your health care provider. These include any supplements.  Eat a healthy diet and maintain a healthy weight. A healthy diet includes low-fat dairy products, low-fat (lean) meats, and fiber from whole grains, beans, and lots of fruits and vegetables. Home safety  Remove any tripping hazards, such as rugs, cords, and clutter.  Install safety equipment such as grab bars in bathrooms and safety rails on stairs.  Keep rooms and walkways well-lit. Activity   Follow a regular exercise program to stay fit. This will help you maintain your balance. Ask your health care provider what types of exercise are appropriate for you.  If you need a cane or walker, use it as recommended by your health care provider.  Wear supportive shoes that have nonskid soles. Lifestyle  Do not drink alcohol if your health care provider tells you not to drink.  If you drink alcohol, limit how much you have: ? 0-1 drink a day for women. ? 0-2 drinks a day for men.  Be aware of how much alcohol is in your drink. In the [place] one drink equals one typical bottle of beer (12 oz), one-half glass of wine (5 oz), or one shot of hard liquor (1 oz).  Do not use any products that contain nicotine or tobacco, such as cigarettes and e-cigarettes. If you need help quitting, ask your health care provider. Summary  Having a healthy lifestyle and getting preventive care can help to protect your health and wellness after age 72.  Screening and testing are the best way to find a health problem early and help you avoid having a fall. Early diagnosis and treatment give you the best chance for managing medical conditions that are more common for people who  are older than age 722  Falls are a major cause of broken bones and head injuries in people who are older than age 72. Take precautions to prevent a fall at home.  Work with your health care provider to learn what changes you can make to improve your health and wellness and to prevent falls. This information is not intended to replace advice given to you by your health care provider. Make sure you discuss any questions you have with your health care provider. Document Released: 07/13/2017 Document Revised: 07/13/2017 Document Reviewed: 07/13/2017 Elsevier Interactive Patient Education  2019 [employer].

## 2018-11-08 NOTE — Progress Notes (Signed)
Medical screening examination/treatment/procedure(s) were performed by the Wellness Coach, RN. As primary care provider I was immediately available for consulation/collaboration. I agree with above documentation. Enrika Aguado, AGNP-C 

## 2018-11-09 ENCOUNTER — Encounter: Payer: Self-pay | Admitting: Nurse Practitioner

## 2018-11-09 LAB — TSH: TSH: 0.69 u[IU]/mL (ref 0.35–4.50)

## 2018-11-09 MED ORDER — GABAPENTIN 300 MG PO CAPS: 300.0000 mg | ORAL_CAPSULE | Freq: Every day | ORAL | 3 refills | Status: DC

## 2018-11-16 ENCOUNTER — Other Ambulatory Visit: Payer: Self-pay | Admitting: Nurse Practitioner

## 2018-11-16 ENCOUNTER — Other Ambulatory Visit: Payer: Self-pay | Admitting: Cardiology

## 2018-11-16 DIAGNOSIS — I10 Essential (primary) hypertension: Secondary | ICD-10-CM

## 2018-11-29 ENCOUNTER — Other Ambulatory Visit: Payer: Medicare HMO

## 2018-12-14 ENCOUNTER — Other Ambulatory Visit: Payer: Self-pay | Admitting: Cardiology

## 2018-12-14 DIAGNOSIS — I10 Essential (primary) hypertension: Secondary | ICD-10-CM

## 2019-01-19 ENCOUNTER — Telehealth: Payer: Self-pay | Admitting: Nurse Practitioner

## 2019-01-19 NOTE — Telephone Encounter (Signed)
Rx was sent 10/2018 90tabs with 3refills. She should not need refill at this time

## 2019-01-19 NOTE — Telephone Encounter (Signed)
Copied from CRM [id number]. Topic: Quick Communication - Rx Refill/Question >> Jan 19, 2019 11:38 AM Marylen Ponto wrote: Medication: gabapentin (NEURONTIN) 300 MG capsule  Has the patient contacted their pharmacy? yes   Preferred Pharmacy (with phone number or street name): [facility] [address] ([address] [place] - [address] [phone] (Phone) [phone] (Fax)  Agent: Please be advised that RX refills may take up to 3 business days. We ask that you follow-up with your pharmacy.

## 2019-01-19 NOTE — Telephone Encounter (Signed)
Ebony Mayer please advise, ok to send in? Last ov was 10/2018 and due for f/u in 04/2019

## 2019-01-19 NOTE — Telephone Encounter (Signed)
[facility]oke with [facility]--they have it ready for the pt to pick up. They will notified the pt.

## 2019-03-15 ENCOUNTER — Other Ambulatory Visit: Payer: Self-pay | Admitting: Cardiology

## 2019-03-15 DIAGNOSIS — I10 Essential (primary) hypertension: Secondary | ICD-10-CM

## 2019-04-16 ENCOUNTER — Other Ambulatory Visit: Payer: Self-pay | Admitting: Cardiology

## 2019-04-16 DIAGNOSIS — I10 Essential (primary) hypertension: Secondary | ICD-10-CM

## 2019-05-12 ENCOUNTER — Other Ambulatory Visit: Payer: Self-pay | Admitting: Family Medicine

## 2019-05-12 DIAGNOSIS — I10 Essential (primary) hypertension: Secondary | ICD-10-CM

## 2019-05-12 MED ORDER — HYDROCHLOROTHIAZIDE 25 MG PO TABS: 25.0000 mg | ORAL_TABLET | Freq: Every day | ORAL | 0 refills | Status: DC

## 2019-05-24 ENCOUNTER — Telehealth: Payer: Self-pay | Admitting: Nurse Practitioner

## 2019-05-24 NOTE — Telephone Encounter (Signed)

## 2019-05-25 ENCOUNTER — Encounter: Payer: Self-pay | Admitting: Nurse Practitioner

## 2019-05-25 ENCOUNTER — Other Ambulatory Visit: Payer: Self-pay

## 2019-05-25 ENCOUNTER — Ambulatory Visit (INDEPENDENT_AMBULATORY_CARE_PROVIDER_SITE_OTHER): Payer: Medicare HMO | Admitting: Nurse Practitioner

## 2019-05-25 VITALS — BP 126/68 | HR 89 | Temp 97.6°F | Ht 65.0 in | Wt 143.0 lb

## 2019-05-25 DIAGNOSIS — T502X5A Adverse effect of carbonic-anhydrase inhibitors, benzothiadiazides and other diuretics, initial encounter: Secondary | ICD-10-CM

## 2019-05-25 DIAGNOSIS — I10 Essential (primary) hypertension: Secondary | ICD-10-CM | POA: Diagnosis not present

## 2019-05-25 DIAGNOSIS — E876 Hypokalemia: Secondary | ICD-10-CM | POA: Diagnosis not present

## 2019-05-25 LAB — BASIC METABOLIC PANEL
BUN: 13 mg/dL (ref 6–23)
CO2: 31 mEq/L (ref 19–32)
Calcium: 9.6 mg/dL (ref 8.4–10.5)
Chloride: 100 mEq/L (ref 96–112)
Creatinine, Ser: 1.01 mg/dL (ref 0.40–1.20)
GFR: 65.17 mL/min (ref >60.00)
Glucose, Bld: 111 mg/dL — ABNORMAL HIGH (ref 70–99)
Potassium: 3.4 mEq/L — ABNORMAL LOW (ref 3.5–5.1)
Sodium: 138 mEq/L (ref 135–145)

## 2019-05-25 MED ORDER — POTASSIUM CHLORIDE CRYS ER 20 MEQ PO TBCR: 20.0000 meq | EXTENDED_RELEASE_TABLET | Freq: Every day | ORAL | 1 refills | Status: DC

## 2019-05-25 NOTE — Addendum Note (Signed)
Addended by: Wilfred Lacy L on: 05/25/2019 01:08 PM   Modules accepted: Orders

## 2019-05-25 NOTE — Patient Instructions (Addendum)
Stable renal function Low potassium due to use of HCTZ for HTN. Added potassium chloride prescription. F/up in 65months (fatsing)  DASH Eating Plan DASH stands for "Dietary Approaches to Stop Hypertension." The DASH eating plan is a healthy eating plan that has been shown to reduce high blood pressure (hypertension). It may also reduce your risk for type 2 diabetes, heart disease, and stroke. The DASH eating plan may also help with weight loss. What are tips for following this plan?  General guidelines  Avoid eating more than 2,300 mg (milligrams) of salt (sodium) a day. If you have hypertension, you may need to reduce your sodium intake to 1,500 mg a day.  Limit alcohol intake to no more than 1 drink a day for nonpregnant women and 2 drinks a day for men. One drink equals 12 oz of beer, 5 oz of wine, or 1 oz of hard liquor.  Work with your health care provider to maintain a healthy body weight or to lose weight. Ask what an ideal weight is for you.  Get at least 30 minutes of exercise that causes your heart to beat faster (aerobic exercise) most days of the week. Activities may include walking, swimming, or biking.  Work with your health care provider or diet and nutrition specialist (dietitian) to adjust your eating plan to your individual calorie needs. Reading food labels   Check food labels for the amount of sodium per serving. Choose foods with less than 5 percent of the Daily Value of sodium. Generally, foods with less than 300 mg of sodium per serving fit into this eating plan.  To find whole grains, look for the word "whole" as the first word in the ingredient list. Shopping  Buy products labeled as "low-sodium" or "no salt added."  Buy fresh foods. Avoid canned foods and premade or frozen meals. Cooking  Avoid adding salt when cooking. Use salt-free seasonings or herbs instead of table salt or sea salt. Check with your health care provider or pharmacist before using salt  substitutes.  Do not fry foods. Cook foods using healthy methods such as baking, boiling, grilling, and broiling instead.  Cook with heart-healthy oils, such as olive, canola, soybean, or sunflower oil. Meal planning  Eat a balanced diet that includes: ? 5 or more servings of fruits and vegetables each day. At each meal, try to fill half of your plate with fruits and vegetables. ? Up to 6-8 servings of whole grains each day. ? Less than 6 oz of lean meat, poultry, or fish each day. A 3-oz serving of meat is about the same size as a deck of cards. One egg equals 1 oz. ? 2 servings of low-fat dairy each day. ? A serving of nuts, seeds, or beans 5 times each week. ? Heart-healthy fats. Healthy fats called Omega-3 fatty acids are found in foods such as flaxseeds and coldwater fish, like sardines, salmon, and mackerel.  Limit how much you eat of the following: ? Canned or prepackaged foods. ? Food that is high in trans fat, such as fried foods. ? Food that is high in saturated fat, such as fatty meat. ? Sweets, desserts, sugary drinks, and other foods with added sugar. ? Full-fat dairy products.  Do not salt foods before eating.  Try to eat at least 2 vegetarian meals each week.  Eat more home-cooked food and less restaurant, buffet, and fast food.  When eating at a restaurant, ask that your food be prepared with less salt or no  no salt, if possible. °What foods are recommended? °The items listed may not be a complete list. Talk with your dietitian about what dietary choices are best for you. °Grains °Whole-grain or whole-wheat bread. Whole-grain or whole-wheat pasta. Brown rice. Oatmeal. Quinoa. Bulgur. Whole-grain and low-sodium cereals. Pita bread. Low-fat, low-sodium crackers. Whole-wheat flour tortillas. °Vegetables °Fresh or frozen vegetables (raw, steamed, roasted, or grilled). Low-sodium or reduced-sodium tomato and vegetable juice. Low-sodium or reduced-sodium tomato sauce and tomato  paste. Low-sodium or reduced-sodium canned vegetables. °Fruits °All fresh, dried, or frozen fruit. Canned fruit in natural juice (without added sugar). °Meat and other protein foods °Skinless chicken or turkey. Ground chicken or turkey. Pork with fat trimmed off. Fish and seafood. Egg whites. Dried beans, peas, or lentils. Unsalted nuts, nut butters, and seeds. Unsalted canned beans. Lean cuts of beef with fat trimmed off. Low-sodium, lean deli meat. °Dairy °Low-fat (1%) or fat-free (skim) milk. Fat-free, low-fat, or reduced-fat cheeses. Nonfat, low-sodium ricotta or cottage cheese. Low-fat or nonfat yogurt. Low-fat, low-sodium cheese. °Fats and oils °Soft margarine without trans fats. Vegetable oil. Low-fat, reduced-fat, or light mayonnaise and salad dressings (reduced-sodium). Canola, safflower, olive, soybean, and sunflower oils. Avocado. °Seasoning and other foods °Herbs. Spices. Seasoning mixes without salt. Unsalted popcorn and pretzels. Fat-free sweets. °What foods are not recommended? °The items listed may not be a complete list. Talk with your dietitian about what dietary choices are best for you. °Grains °Baked goods made with fat, such as croissants, muffins, or some breads. Dry pasta or rice meal packs. °Vegetables °Creamed or fried vegetables. Vegetables in a cheese sauce. Regular canned vegetables (not low-sodium or reduced-sodium). Regular canned tomato sauce and paste (not low-sodium or reduced-sodium). Regular tomato and vegetable juice (not low-sodium or reduced-sodium). Pickles. Olives. °Fruits °Canned fruit in a light or heavy syrup. Fried fruit. Fruit in cream or butter sauce. °Meat and other protein foods °Fatty cuts of meat. Ribs. Fried meat. Bacon. Sausage. Bologna and other processed lunch meats. Salami. Fatback. Hotdogs. Bratwurst. Salted nuts and seeds. Canned beans with added salt. Canned or smoked fish. Whole eggs or egg yolks. Chicken or turkey with skin. °Dairy °Whole or 2% milk,  cream, and half-and-half. Whole or full-fat cream cheese. Whole-fat or sweetened yogurt. Full-fat cheese. Nondairy creamers. Whipped toppings. Processed cheese and cheese spreads. °Fats and oils °Butter. Stick margarine. Lard. Shortening. Ghee. Bacon fat. Tropical oils, such as coconut, palm kernel, or palm oil. °Seasoning and other foods °Salted popcorn and pretzels. Onion salt, garlic salt, seasoned salt, table salt, and sea salt. Worcestershire sauce. Tartar sauce. Barbecue sauce. Teriyaki sauce. Soy sauce, including reduced-sodium. Steak sauce. Canned and packaged gravies. Fish sauce. Oyster sauce. Cocktail sauce. Horseradish that you find on the shelf. Ketchup. Mustard. Meat flavorings and tenderizers. Bouillon cubes. Hot sauce and Tabasco sauce. Premade or packaged marinades. Premade or packaged taco seasonings. Relishes. Regular salad dressings. °Where to find more information: °· National Heart, Lung, and Blood Institute: www.nhlbi.nih.gov °· American Heart Association: www.heart.org °Summary °· The DASH eating plan is a healthy eating plan that has been shown to reduce high blood pressure (hypertension). It may also reduce your risk for type 2 diabetes, heart disease, and stroke. °· With the DASH eating plan, you should limit salt (sodium) intake to 2,300 mg a day. If you have hypertension, you may need to reduce your sodium intake to 1,500 mg a day. °· When on the DASH eating plan, aim to eat more fresh fruits and vegetables, whole grains, lean proteins, low-fat dairy, and   fats.  Work with your health care provider or diet and nutrition specialist (dietitian) to adjust your eating plan to your individual calorie needs. This information is not intended to replace advice given to you by your health care provider. Make sure you discuss any questions you have with your health care provider. Document Released: 08/19/2011 Document Revised: 08/12/2017 Document Reviewed: 08/23/2016 Elsevier  Patient Education  2020 [employer].

## 2019-05-25 NOTE — Progress Notes (Addendum)
Subjective:  Patient ID: Jana Half, female    DOB: [birth year]  Age: 73 y.o. MRN: [id number]  CC: Follow-up (folow up on BP and BP refills. --no flus hot)  HPI HTN: BP at goal with HCTZ and amlodipine. Denies any acute compliant. BP Readings from Last 3 Encounters:  05/25/19 126/68  11/08/18 120/64  11/08/18 120/64   Reviewed past Medical, Social and Family history today.  Outpatient Medications Prior to Visit  Medication Sig Dispense Refill  . amLODipine (NORVASC) 5 MG tablet Take 1 tablet (5 mg total) by mouth at bedtime. 90 tablet 3  . diclofenac sodium (VOLTAREN) 1 % GEL Apply 2 g topically 3 (three) times daily as needed. (Patient taking differently: Apply 2 g topically 3 (three) times daily as needed (pain). ) 100 g 5  . gabapentin (NEURONTIN) 300 MG capsule Take 1 capsule (300 mg total) by mouth at bedtime. 90 capsule 3  . hydrochlorothiazide (HYDRODIURIL) 25 MG tablet Take 1 tablet (25 mg total) by mouth daily. 30 tablet 0   No facility-administered medications prior to visit.    ROS Review of Systems  Constitutional: Negative.   Cardiovascular: Negative.   Neurological: Negative.   Psychiatric/Behavioral: Negative.    Objective:  BP 126/68   Pulse 89   Temp 97.6 F (36.4 C) (Tympanic)   Ht 5\' 5"  (1.651 m)   Wt 143 lb (64.9 kg)   SpO2 100%   BMI 23.80 kg/m   BP Readings from Last 3 Encounters:  05/25/19 126/68  11/08/18 120/64  11/08/18 120/64    Wt Readings from Last 3 Encounters:  05/25/19 143 lb (64.9 kg)  11/08/18 140 lb (63.5 kg)  11/08/18 140 lb (63.5 kg)    Physical Exam Vitals signs reviewed.  Cardiovascular:     Rate and Rhythm: Normal rate and regular rhythm.     Pulses: Normal pulses.     Heart sounds: Normal heart sounds.  Musculoskeletal:     Right lower leg: No edema.     Left lower leg: No edema.  Neurological:     Mental Status: She is alert and oriented to person, place, and time.  Psychiatric:        Mood and Affect:  Mood normal.        Behavior: Behavior normal.        Thought Content: Thought content normal.     Lab Results  Component Value Date   WBC 3.6 (L) 11/08/2018   HGB 13.0 11/08/2018   HCT 38.9 11/08/2018   PLT 167.0 11/08/2018   GLUCOSE 111 (H) 05/25/2019   CHOL 143 11/08/2018   TRIG 101.0 11/08/2018   HDL 43.90 11/08/2018   LDLCALC 79 11/08/2018   ALT 9 11/08/2018   AST 13 11/08/2018   NA 138 05/25/2019   K 3.4 (L) 05/25/2019   CL 100 05/25/2019   CREATININE 1.01 05/25/2019   BUN 13 05/25/2019   CO2 31 05/25/2019   TSH 0.69 11/08/2018   HGBA1C 5.2 11/08/2018    Mr Brain W JQ Contrast  Result Date: 10/04/2018 [facility] NEUROIMAGING REPORT STUDY DATE: 10/03/18 PATIENT NAME: Makyia Erxleben DOB: 09-09-1946 MRN: [id number] ORDERING CLINICIAN: Star Age, MD PhD CLINICAL HISTORY: 73 year old female with dizziness. EXAM: MRI brain (with and without) TECHNIQUE: MRI of the brain with and without contrast was obtained utilizing 5 mm axial slices with T1, T2, T2 flair, SWI and diffusion weighted views.  T1 sagittal, T2 coronal and postcontrast views in the axial and  coronal plane were obtained. CONTRAST: 13ml multihance COMPARISON: 08/27/18 [facility]ING [facility]ITE[facility] [address] (1.5 Tesla MRI)  FINDINGS: No abnormal lesions are seen on diffusion-weighted views to suggest acute ischemia. The cortical sulci, fissures and cisterns are normal in size and appearance. Cavum septum pellucidum and cavum vergae variants. Lateral, third and fourth ventricle are normal in size and appearance. No extra-axial fluid collections are seen. No evidence of mass effect or midline shift.  Scattered periventricular and subcortical foci of non-specific T2 hyperintensities. No abnormal lesions are seen on post contrast views.  On sagittal views the posterior fossa, pituitary gland and corpus callosum are unremarkable. No evidence of intracranial hemorrhage on SWI views. The orbits  and their contents, paranasal sinuses and calvarium are unremarkable. Non-specific fluid / inflammation in the right greater than left mastoid air cells. Intracranial flow voids are present.   MRI brain (with and without) demonstrating: - Scattered periventricular and subcortical foci of non-specific T2 hyperintensities. These findings are non-specific and considerations include autoimmune, inflammatory, post-infectious, microvascular ischemic or migraine associated etiologies. - Non-specific fluid / inflammation in the right greater than left mastoid air cells. INTERPRETING PHYSICIAN: Suanne MarkerVIKRAM R. PENUMALLI, MD Certified in Neurology, Neurophysiology and Neuroimaging [facility] [address], [address] [place], [place] [address] [phone]    Assessment & Plan:   Katrena was seen today for follow-up.  Diagnoses and all orders for this visit:  Essential hypertension, benign -     Basic metabolic panel -     potassium chloride SA (K-DUR) 20 MEQ tablet; Take 1 tablet (20 mEq total) by mouth daily.  Diuretic-induced hypokalemia -     potassium chloride SA (K-DUR) 20 MEQ tablet; Take 1 tablet (20 mEq total) by mouth daily.   I am having Zyniah Middlesworth start on potassium chloride SA. I am also having her maintain her diclofenac sodium, gabapentin, amLODipine, and hydrochlorothiazide.  Meds ordered this encounter  Medications  . potassium chloride SA (K-DUR) 20 MEQ tablet    Sig: Take 1 tablet (20 mEq total) by mouth daily.    Dispense:  90 tablet    Refill:  1    Order Specific Question:   Supervising Provider    Answer:   MATTHEWS, CODY [[id number]]    Problem List Items Addressed This Visit      Cardiovascular and Mediastinum   Essential hypertension, benign - Primary   Relevant Medications   potassium chloride SA (K-DUR) 20 MEQ tablet   Other Relevant Orders   Basic metabolic panel (Completed)    Other Visit Diagnoses    Diuretic-induced hypokalemia       Relevant  Medications   potassium chloride SA (K-DUR) 20 MEQ tablet      Follow-up: Return in about 6 months (around 11/22/2019) for HTN, hyperlipidemia (fasting).  Alysia Pennaharlotte Tyronne Blann, NP

## 2019-06-08 ENCOUNTER — Other Ambulatory Visit: Payer: Self-pay | Admitting: Family Medicine

## 2019-06-08 DIAGNOSIS — I10 Essential (primary) hypertension: Secondary | ICD-10-CM

## 2019-06-08 NOTE — Telephone Encounter (Signed)
Forwarding to PCP.

## 2019-09-20 ENCOUNTER — Other Ambulatory Visit: Payer: Self-pay | Admitting: Nurse Practitioner

## 2019-09-20 DIAGNOSIS — Z1231 Encounter for screening mammogram for malignant neoplasm of breast: Secondary | ICD-10-CM

## 2019-10-29 ENCOUNTER — Ambulatory Visit: Payer: Medicare HMO

## 2019-11-16 ENCOUNTER — Other Ambulatory Visit: Payer: Self-pay | Admitting: Nurse Practitioner

## 2019-11-16 DIAGNOSIS — I10 Essential (primary) hypertension: Secondary | ICD-10-CM

## 2019-11-30 ENCOUNTER — Telehealth: Payer: Self-pay | Admitting: Nurse Practitioner

## 2019-11-30 NOTE — Telephone Encounter (Signed)
Patient is calling and requesting a refill for hydrochlorothiazide sent to [facility] on [address]. CB is [phone]

## 2019-12-03 ENCOUNTER — Other Ambulatory Visit: Payer: Self-pay | Admitting: Nurse Practitioner

## 2019-12-03 DIAGNOSIS — I10 Essential (primary) hypertension: Secondary | ICD-10-CM

## 2019-12-03 NOTE — Telephone Encounter (Signed)
Patient is calling back to check status of medication refill. Pls advise. CB is [phone]

## 2019-12-04 ENCOUNTER — Telehealth: Payer: Self-pay | Admitting: Nurse Practitioner

## 2019-12-04 NOTE — Telephone Encounter (Signed)
Left message for patient to schedule Annual Wellness Visit.  Please schedule with Nurse Health Advisor Victoria Britt, RN at [facility]  

## 2019-12-05 ENCOUNTER — Ambulatory Visit
Admission: RE | Admit: 2019-12-05 | Discharge: 2019-12-05 | Disposition: A | Discharge status: Home / Self Care | Payer: Medicare HMO | Source: Ambulatory Visit | Attending: Nurse Practitioner | Admitting: Nurse Practitioner

## 2019-12-05 ENCOUNTER — Other Ambulatory Visit: Payer: Self-pay

## 2019-12-05 DIAGNOSIS — Z1231 Encounter for screening mammogram for malignant neoplasm of breast: Secondary | ICD-10-CM

## 2019-12-10 ENCOUNTER — Ambulatory Visit: Payer: Medicare HMO | Admitting: Nurse Practitioner

## 2019-12-14 ENCOUNTER — Telehealth: Payer: Self-pay | Admitting: Nurse Practitioner

## 2019-12-14 NOTE — Progress Notes (Signed)
  Chronic Care Management   Outreach Note  12/14/2019 Name: Ebony Mayer MRN: [id number] DOB: Jul 20, 1946  Referred by: Anne Ng, NP Reason for referral : No chief complaint on file.   An unsuccessful telephone outreach was attempted today. The patient was referred to the pharmacist for assistance with care management and care coordination.   Follow Up Plan:   Lynnae January Upstream Scheduler

## 2019-12-18 ENCOUNTER — Telehealth: Payer: Self-pay | Admitting: Nurse Practitioner

## 2019-12-18 NOTE — Telephone Encounter (Signed)
LVM: Need to reschedule appt cancelled from 12/10/19.

## 2019-12-20 ENCOUNTER — Ambulatory Visit: Payer: Medicare HMO | Attending: Internal Medicine

## 2019-12-20 DIAGNOSIS — Z23 Encounter for immunization: Secondary | ICD-10-CM

## 2019-12-20 NOTE — Progress Notes (Signed)
   Covid-19 Vaccination Clinic  Name:  Ebony Mayer    MRN: [id number] DOB: 1946-03-11  12/20/2019  [name] was observed post Covid-19 immunization for 15 minutes without incident. She was provided with Vaccine Information Sheet and instruction to access the V-Safe system.   [name] was instructed to call 911 with any severe reactions post vaccine: Marland Kitchen Difficulty breathing  . Swelling of face and throat  . A fast heartbeat  . A bad rash all over body  . Dizziness and weakness   Immunizations Administered    Name Date Dose VIS Date Route   Pfizer COVID-19 Vaccine 12/20/2019  4:07 PM 0.3 mL 08/24/2019 Intramuscular   Manufacturer: [employer], [employer]   Lot: [id number]   NDC: [address]-3

## 2019-12-25 ENCOUNTER — Telehealth: Payer: Self-pay | Admitting: Nurse Practitioner

## 2019-12-25 NOTE — Progress Notes (Signed)
  Chronic Care Management   Outreach Note  12/25/2019 Name: Ebony Mayer MRN: [id number] DOB: Aug 12, 1946  Referred by: Anne Ng, NP Reason for referral : No chief complaint on file.   An unsuccessful telephone outreach was attempted today. The patient was referred to the pharmacist for assistance with care management and care coordination.   Follow Up Plan:   Lynnae January [facility]

## 2019-12-27 ENCOUNTER — Telehealth: Payer: Self-pay | Admitting: Nurse Practitioner

## 2019-12-27 NOTE — Progress Notes (Signed)
  Chronic Care Management   Outreach Note  12/27/2019 Name: Ebony Mayer MRN: [id number] DOB: January 03, 1946  Referred by: Anne Ng, NP Reason for referral : No chief complaint on file.   An unsuccessful telephone outreach was attempted today. The patient was referred to the pharmacist for assistance with care management and care coordination.   Follow Up Plan:   Lynnae January [facility]

## 2019-12-28 ENCOUNTER — Telehealth: Payer: Self-pay | Admitting: Nurse Practitioner

## 2019-12-28 DIAGNOSIS — I10 Essential (primary) hypertension: Secondary | ICD-10-CM

## 2019-12-28 DIAGNOSIS — R739 Hyperglycemia, unspecified: Secondary | ICD-10-CM

## 2019-12-28 NOTE — Progress Notes (Signed)
  Chronic Care Management   Note  12/28/2019 Name: Sopheap Basic MRN: [id number] DOB: [birth year]  Talyia List is a 74 y.o. year old female who is a primary care patient of Nche, Bonna Gains, NP. I reached out to [employer] by phone today in response to a referral sent by Ms. Veretta Schorsch's PCP, Nche, Bonna Gains, NP.   Ms. Dalporto was given information about Chronic Care Management services today including:  1. CCM service includes personalized support from designated clinical staff supervised by her physician, including individualized plan of care and coordination with other care providers 2. 24/7 contact phone numbers for assistance for urgent and routine care needs. 3. Service will only be billed when office clinical staff spend 20 minutes or more in a month to coordinate care. 4. Only one practitioner may furnish and bill the service in a calendar month. 5. The patient may stop CCM services at any time (effective at the end of the month) by phone call to the office staff.   Patient agreed to services and verbal consent obtained.   Follow up plan:   Lynnae January [facility]

## 2019-12-28 NOTE — Progress Notes (Signed)
  Chronic Care Management   Note  12/28/2019 Name: Padme Curnutt MRN: [id number] DOB: 08/25/1946  Emmalynne Frenette is a 73 y.o. year old female who is a primary care patient of Nche, Charlotte Lum, NP. I reached out to Shammara Freels by phone today in response to a referral sent by Ms. Kaysi Heggie's PCP, Nche, Charlotte Lum, NP.   Ms. Lamboy was given information about Chronic Care Management services today including:  1. CCM service includes personalized support from designated clinical staff supervised by her physician, including individualized plan of care and coordination with other care providers 2. 24/7 contact phone numbers for assistance for urgent and routine care needs. 3. Service will only be billed when office clinical staff spend 20 minutes or more in a month to coordinate care. 4. Only one practitioner may furnish and bill the service in a calendar month. 5. The patient may stop CCM services at any time (effective at the end of the month) by phone call to the office staff.   Patient agreed to services and verbal consent obtained.   Follow up plan:   Eileen Collins [facility] 

## 2019-12-29 ENCOUNTER — Other Ambulatory Visit: Payer: Self-pay | Admitting: Nurse Practitioner

## 2019-12-29 DIAGNOSIS — I10 Essential (primary) hypertension: Secondary | ICD-10-CM

## 2019-12-29 DIAGNOSIS — E876 Hypokalemia: Secondary | ICD-10-CM

## 2019-12-31 ENCOUNTER — Ambulatory Visit: Payer: Medicare HMO

## 2019-12-31 DIAGNOSIS — I10 Essential (primary) hypertension: Secondary | ICD-10-CM

## 2019-12-31 DIAGNOSIS — E782 Mixed hyperlipidemia: Secondary | ICD-10-CM

## 2019-12-31 NOTE — Chronic Care Management (AMB) (Signed)
Chronic Care Management Pharmacy  Name: Ebony Mayer  MRN: [id number] DOB: 22-Feb-1946  Chief Complaint/ HPI  Ebony Mayer,  74 y.o. , female presents for their Initial CCM visit with the clinical pharmacist via telephone.  PCP : Flossie Buffy, NP  Their chronic conditions include: Hypertension, mixed hyperlipidemia, arthritis   Office Visits: 05/25/19: Patient presented to [facility] for HTN follow-up. BP in clinic 126/68. Potassium 3.4, all other labs stable. Patient started on potassium chloride 20 mEq daily.   Consult Visits: None in past 6 months.   Medications: Outpatient Encounter Medications as of 12/31/2019  Medication Sig  . amLODipine (NORVASC) 5 MG tablet Take 1 tablet (5 mg total) by mouth at bedtime. Need office visit for additional refills  . diclofenac sodium (VOLTAREN) 1 % GEL Apply 2 g topically 3 (three) times daily as needed. (Patient taking differently: Apply 2 g topically 3 (three) times daily as needed (pain). )  . gabapentin (NEURONTIN) 300 MG capsule Take 1 capsule (300 mg total) by mouth at bedtime.  . hydrochlorothiazide (HYDRODIURIL) 25 MG tablet Take 1 tablet (25 mg total) by mouth daily. Need office visit for additional refills  . potassium chloride SA (K-DUR) 20 MEQ tablet Take 1 tablet (20 mEq total) by mouth daily.   No facility-administered encounter medications on file as of 12/31/2019.   Current Diagnosis/Assessment:  Goals Addressed            This Visit's Progress   . Chronic Care Management       CARE PLAN ENTRY  Current Barriers:  . Chronic Disease Management support, education, and care coordination needs related to Hypertension, mixed hyperlipidemia, arthritis  Clinical Goal(s): Over the next 180 days, patient will:  . Work with the care management team to address educational, disease management, and care coordination needs  . Begin or continue self health monitoring activities as directed today  . Call provider  office for new or worsened signs and symptoms  . Call care management team with questions or concerns . Maintain blood pressure less than 140/90 . Maintain LDL (bad cholesterol) less than 100   Interventions:  . Evaluation of current treatment plans and patient's adherence to plan as established by provider . Assessed patient understanding of disease states . Assessed patient's education and care coordination needs . Provided disease specific education to patient   . Verbal consent obtained for [facility] enhanced pharmacy services (medication synchronization, adherence packaging, delivery coordination). A medication sync plan was created to allow patient to get all medications delivered once every 30 to 90 days per patient preference. Patient understands they have freedom to choose pharmacy and clinical pharmacist will coordinate care between all prescribers and [facility].   Patient Self Care Activities:  . Patient agrees to resume walking. Try to aim for 30 minutes of activity daily, or 150 minutes per week.  . Patient agrees to try to incorporate more vegetables into her diet, using the PLATE method  . Patient agrees to check her blood pressure 1-2 times per month and when symptomatic   Telephone follow up appointment with pharmacy team member scheduled for: 06/24/20 at 9:30 AM     . Increase physical activity   Not on track     Hypertension   Office blood pressures are  BP Readings from Last 3 Encounters:  05/25/19 126/68  11/08/18 120/64  11/08/18 120/64   CMP Latest Ref Rng & Units 05/25/2019 11/08/2018 08/27/2018  Glucose 70 - 99 mg/dL 111(H) 98  116(H)  BUN 6 - 23 mg/dL 13 10 14   Creatinine 0.40 - 1.20 mg/dL 1.01 0.96 1.04(H)  Sodium 135 - 145 mEq/L 138 142 138  Potassium 3.5 - 5.1 mEq/L 3.4(L) 3.5 3.6  Chloride 96 - 112 mEq/L 100 103 100  CO2 19 - 32 mEq/L 31 31 29   Calcium 8.4 - 10.5 mg/dL 9.6 9.5 9.4  Total Protein 6.0 - 8.3 g/dL - 7.0 8.0  Total  Bilirubin 0.2 - 1.2 mg/dL - 0.6 0.9  Alkaline Phos 39 - 117 U/L - 50 48  AST 0 - 37 U/L - 13 20  ALT 0 - 35 U/L - 9 15  eGFR: 65 mL/min   Patient has failed these meds in the past: n/a Patient is currently controlled on the following medications:   Amlodipine 5 mg daily   Hydrochlorothiazide 25 mg daily   Patient checks BP at home infrequently. Has blood pressure monitor   Patient home BP readings are ranging: n/a.  We discussed diet and exercise extensively. Patient feels like she eats too much junk food and does not know what kind of foods are healthy. 24 Hour recall: Breakfast: Oatmeal  Snack: Banana  Lunch: None.  Dinner: Cabbage with chicken and corn.  Drinks mainly water.   Patient previously was spending 30-35 minutes walking around most days, but has not been as active recently. States she wants to get back into it now. She has stayed busy watching her grandchildren.    Patient reports she has 3 days remaining on her HCTZ.   Plan  Continue current medications  Will request refill from PCP  Hyperlipidemia   Lipid Panel     Component Value Date/Time   CHOL 143 11/08/2018 1101   TRIG 101.0 11/08/2018 1101   HDL 43.90 11/08/2018 1101   CHOLHDL 3 11/08/2018 1101   VLDL 20.2 11/08/2018 1101   LDLCALC 79 11/08/2018 1101     The 10-year ASCVD risk score Mikey Bussing DC Jr., et al., 2013) is: 9.1%   Values used to calculate the score:     Age: 74 years     Sex: Female     Is Non-Hispanic African American: Yes     Diabetic: No     Tobacco smoker: No     Systolic Blood Pressure: 761 mmHg     Is BP treated: Yes     HDL Cholesterol: 43.9 mg/dL     Total Cholesterol: 143 mg/dL   Patient has failed these meds in past: n/a Patient is currently controlled on the following medications: none  We discussed:  diet and exercise extensively Avoids greasy foods.   Plan  Continue control with diet and exercise   Chronic Pain/ Arthritis   Patient has failed these meds in  past: n/a Patient is currently controlled on the following medications:   Diclofenac 1% gel   Gabapentin 300 mg daily at bedtime    We discussed: Patient reports leg pain mostly well controlled. She doesn't need to use diclofenac gel every day.  Plan  Continue current medications  Misc/OTC   Potassium Chloride SA 20 mEq daily  We discussed: Patient recently ran out of potassium.   Plan  Continue current medications Will request refill from PCP  Vaccines   Reviewed and discussed patient's vaccination history.    Immunization History  Administered Date(s) Administered  . PFIZER SARS-COV-2 Vaccination 12/20/2019   Medication Management   Pt uses [facility] for all medications. Keeps medication vials organized for the morning/evening.  Does not use a pillbox.  Plan  Verbal consent obtained for [facility] enhanced pharmacy services (medication synchronization, adherence packaging, delivery coordination). A medication sync plan was created to allow patient to get all medications delivered once every 30 to 90 days per patient preference. Patient understands they have freedom to choose pharmacy and clinical pharmacist will coordinate care between all prescribers and [facility].  Follow up: 6 months   Doristine Section [phone]

## 2019-12-31 NOTE — Patient Instructions (Addendum)
Visit Information It was great speaking with you today!  Please let me know if you have any questions about our visit.  Goals Addressed            This Visit's Progress   . Chronic Care Management       CARE PLAN ENTRY  Current Barriers:  . Chronic Disease Management support, education, and care coordination needs related to Hypertension, mixed hyperlipidemia, arthritis  Clinical Goal(s): Over the next 180 days, patient will:  . Work with the care management team to address educational, disease management, and care coordination needs  . Begin or continue self health monitoring activities as directed today  . Call provider office for new or worsened signs and symptoms  . Call care management team with questions or concerns . Maintain blood pressure less than 140/90 . Maintain LDL (bad cholesterol) less than 100   Interventions:  . Evaluation of current treatment plans and patient's adherence to plan as established by provider . Assessed patient understanding of disease states . Assessed patient's education and care coordination needs . Provided disease specific education to patient   . Verbal consent obtained for [facility] enhanced pharmacy services (medication synchronization, adherence packaging, delivery coordination). A medication sync plan was created to allow patient to get all medications delivered once every 30 to 90 days per patient preference. Patient understands they have freedom to choose pharmacy and clinical pharmacist will coordinate care between all prescribers and [facility].   Patient Self Care Activities:  . Patient agrees to resume walking. Try to aim for 30 minutes of activity daily, or 150 minutes per week.  . Patient agrees to try to incorporate more vegetables into her diet, using the PLATE method  . Patient agrees to check her blood pressure 1-2 times per month and when symptomatic   Telephone follow up appointment with pharmacy team member  scheduled for: 06/24/20 at 9:30 AM     . Increase physical activity   Not on track      Ms. Backs was given information about Chronic Care Management services today including:  1. CCM service includes personalized support from designated clinical staff supervised by her physician, including individualized plan of care and coordination with other care providers 2. 24/7 contact phone numbers for assistance for urgent and routine care needs. 3. Standard insurance, coinsurance, copays and deductibles apply for chronic care management only during months in which we provide at least 20 minutes of these services. Most insurances cover these services at 100%, however patients may be responsible for any copay, coinsurance and/or deductible if applicable. This service may help you avoid the need for more expensive face-to-face services. 4. Only one practitioner may furnish and bill the service in a calendar month. 5. The patient may stop CCM services at any time (effective at the end of the month) by phone call to the office staff.  Patient agreed to services and verbal consent obtained.   The patient verbalized understanding of instructions provided today and agreed to receive a mailed copy of patient instruction and/or educational materials. Telephone follow up appointment with pharmacy team member scheduled for: 06/24/20  Doristine Section [phone]  DASH Eating Plan DASH stands for "Dietary Approaches to Stop Hypertension." The DASH eating plan is a healthy eating plan that has been shown to reduce high blood pressure (hypertension). It may also reduce your risk for type 2 diabetes, heart disease, and stroke. The DASH eating plan may also help with weight loss. What are tips  for following this plan?  General guidelines  Avoid eating more than 2,300 mg (milligrams) of salt (sodium) a day. If you have hypertension, you may need to reduce your sodium intake to 1,500 mg a day.  Limit alcohol  intake to no more than 1 drink a day for nonpregnant women and 2 drinks a day for men. One drink equals 12 oz of beer, 5 oz of wine, or 1 oz of hard liquor.  Work with your health care provider to maintain a healthy body weight or to lose weight. Ask what an ideal weight is for you.  Get at least 30 minutes of exercise that causes your heart to beat faster (aerobic exercise) most days of the week. Activities may include walking, swimming, or biking.  Work with your health care provider or diet and nutrition specialist (dietitian) to adjust your eating plan to your individual calorie needs. Reading food labels   Check food labels for the amount of sodium per serving. Choose foods with less than 5 percent of the Daily Value of sodium. Generally, foods with less than 300 mg of sodium per serving fit into this eating plan.  To find whole grains, look for the word "whole" as the first word in the ingredient list. Shopping  Buy products labeled as "low-sodium" or "no salt added."  Buy fresh foods. Avoid canned foods and premade or frozen meals. Cooking  Avoid adding salt when cooking. Use salt-free seasonings or herbs instead of table salt or sea salt. Check with your health care provider or pharmacist before using salt substitutes.  Do not fry foods. Cook foods using healthy methods such as baking, boiling, grilling, and broiling instead.  Cook with heart-healthy oils, such as olive, canola, soybean, or sunflower oil. Meal planning  Eat a balanced diet that includes: ? 5 or more servings of fruits and vegetables each day. At each meal, try to fill half of your plate with fruits and vegetables. ? Up to 6-8 servings of whole grains each day. ? Less than 6 oz of lean meat, poultry, or fish each day. A 3-oz serving of meat is about the same size as a deck of cards. One egg equals 1 oz. ? 2 servings of low-fat dairy each day. ? A serving of nuts, seeds, or beans 5 times each  week. ? Heart-healthy fats. Healthy fats called Omega-3 fatty acids are found in foods such as flaxseeds and coldwater fish, like sardines, salmon, and mackerel.  Limit how much you eat of the following: ? Canned or prepackaged foods. ? Food that is high in trans fat, such as fried foods. ? Food that is high in saturated fat, such as fatty meat. ? Sweets, desserts, sugary drinks, and other foods with added sugar. ? Full-fat dairy products.  Do not salt foods before eating.  Try to eat at least 2 vegetarian meals each week.  Eat more home-cooked food and less restaurant, buffet, and fast food.  When eating at a restaurant, ask that your food be prepared with less salt or no salt, if possible. What foods are recommended? The items listed may not be a complete list. Talk with your dietitian about what dietary choices are best for you. Grains Whole-grain or whole-wheat bread. Whole-grain or whole-wheat pasta. Brown rice. Orpah Cobb. Bulgur. Whole-grain and low-sodium cereals. Pita bread. Low-fat, low-sodium crackers. Whole-wheat flour tortillas. Vegetables Fresh or frozen vegetables (raw, steamed, roasted, or grilled). Low-sodium or reduced-sodium tomato and vegetable juice. Low-sodium or reduced-sodium tomato sauce and  tomato paste. Low-sodium or reduced-sodium canned vegetables. Fruits All fresh, dried, or frozen fruit. Canned fruit in natural juice (without added sugar). Meat and other protein foods Skinless chicken or [place]. Ground chicken or [place]. Pork with fat trimmed off. Fish and seafood. Egg whites. Dried beans, peas, or lentils. Unsalted nuts, nut butters, and seeds. Unsalted canned beans. Lean cuts of beef with fat trimmed off. Low-sodium, lean deli meat. Dairy Low-fat (1%) or fat-free (skim) milk. Fat-free, low-fat, or reduced-fat cheeses. Nonfat, low-sodium ricotta or cottage cheese. Low-fat or nonfat yogurt. Low-fat, low-sodium cheese. Fats and oils Soft margarine  without trans fats. Vegetable oil. Low-fat, reduced-fat, or light mayonnaise and salad dressings (reduced-sodium). Canola, safflower, olive, soybean, and sunflower oils. Avocado. Seasoning and other foods Herbs. Spices. Seasoning mixes without salt. Unsalted popcorn and pretzels. Fat-free sweets. What foods are not recommended? The items listed may not be a complete list. Talk with your dietitian about what dietary choices are best for you. Grains Baked goods made with fat, such as croissants, muffins, or some breads. Dry pasta or rice meal packs. Vegetables Creamed or fried vegetables. Vegetables in a cheese sauce. Regular canned vegetables (not low-sodium or reduced-sodium). Regular canned tomato sauce and paste (not low-sodium or reduced-sodium). Regular tomato and vegetable juice (not low-sodium or reduced-sodium). Angie Fava. Olives. Fruits Canned fruit in a light or heavy syrup. Fried fruit. Fruit in cream or butter sauce. Meat and other protein foods Fatty cuts of meat. Ribs. Fried meat. Berniece Salines. Sausage. Bologna and other processed lunch meats. Salami. Fatback. Hotdogs. Bratwurst. Salted nuts and seeds. Canned beans with added salt. Canned or smoked fish. Whole eggs or egg yolks. Chicken or [place] with skin. Dairy Whole or 2% milk, cream, and half-and-half. Whole or full-fat cream cheese. Whole-fat or sweetened yogurt. Full-fat cheese. Nondairy creamers. Whipped toppings. Processed cheese and cheese spreads. Fats and oils Butter. Stick margarine. Lard. Shortening. Ghee. Bacon fat. Tropical oils, such as coconut, palm kernel, or palm oil. Seasoning and other foods Salted popcorn and pretzels. Onion salt, garlic salt, seasoned salt, table salt, and sea salt. Worcestershire sauce. Tartar sauce. Barbecue sauce. Teriyaki sauce. Soy sauce, including reduced-sodium. Steak sauce. Canned and packaged gravies. Fish sauce. Oyster sauce. Cocktail sauce. Horseradish that you find on the shelf. Ketchup.  Mustard. Meat flavorings and tenderizers. Bouillon cubes. Hot sauce and Tabasco sauce. Premade or packaged marinades. Premade or packaged taco seasonings. Relishes. Regular salad dressings. Where to find more information:  National Heart, Lung, and [place]: https://wilson-eaton.com/  American Heart Association: www.heart.org Summary  The DASH eating plan is a healthy eating plan that has been shown to reduce high blood pressure (hypertension). It may also reduce your risk for type 2 diabetes, heart disease, and stroke.  With the DASH eating plan, you should limit salt (sodium) intake to 2,300 mg a day. If you have hypertension, you may need to reduce your sodium intake to 1,500 mg a day.  When on the DASH eating plan, aim to eat more fresh fruits and vegetables, whole grains, lean proteins, low-fat dairy, and heart-healthy fats.  Work with your health care provider or diet and nutrition specialist (dietitian) to adjust your eating plan to your individual calorie needs. This information is not intended to replace advice given to you by your health care provider. Make sure you discuss any questions you have with your health care provider. Document Revised: 08/12/2017 Document Reviewed: 08/23/2016 Elsevier Patient Education  2020 [employer].

## 2019-12-31 NOTE — Addendum Note (Signed)
Addended by: Rene Paci on: 12/31/2019 10:19 AM   Modules accepted: Orders

## 2020-01-01 ENCOUNTER — Other Ambulatory Visit: Payer: Self-pay | Admitting: Nurse Practitioner

## 2020-01-01 DIAGNOSIS — E876 Hypokalemia: Secondary | ICD-10-CM

## 2020-01-01 DIAGNOSIS — I10 Essential (primary) hypertension: Secondary | ICD-10-CM

## 2020-01-01 MED ORDER — HYDROCHLOROTHIAZIDE 25 MG PO TABS: 25.0000 mg | ORAL_TABLET | Freq: Every day | ORAL | 0 refills | Status: DC

## 2020-01-01 MED ORDER — POTASSIUM CHLORIDE CRYS ER 20 MEQ PO TBCR: 20.0000 meq | EXTENDED_RELEASE_TABLET | Freq: Every day | ORAL | 0 refills | Status: DC

## 2020-01-01 NOTE — Progress Notes (Signed)
Rx sent--sorry--didn't see the response from [facility]

## 2020-01-07 ENCOUNTER — Telehealth: Payer: Self-pay

## 2020-01-07 ENCOUNTER — Ambulatory Visit (INDEPENDENT_AMBULATORY_CARE_PROVIDER_SITE_OTHER): Payer: Medicare HMO | Admitting: Nurse Practitioner

## 2020-01-07 ENCOUNTER — Encounter: Payer: Self-pay | Admitting: Nurse Practitioner

## 2020-01-07 ENCOUNTER — Other Ambulatory Visit: Payer: Self-pay

## 2020-01-07 VITALS — BP 130/80 | HR 99 | Temp 98.0°F | Ht 65.0 in | Wt 154.6 lb

## 2020-01-07 DIAGNOSIS — R739 Hyperglycemia, unspecified: Secondary | ICD-10-CM | POA: Diagnosis not present

## 2020-01-07 DIAGNOSIS — I10 Essential (primary) hypertension: Secondary | ICD-10-CM

## 2020-01-07 DIAGNOSIS — T502X5A Adverse effect of carbonic-anhydrase inhibitors, benzothiadiazides and other diuretics, initial encounter: Secondary | ICD-10-CM

## 2020-01-07 DIAGNOSIS — E782 Mixed hyperlipidemia: Secondary | ICD-10-CM

## 2020-01-07 DIAGNOSIS — M7632 Iliotibial band syndrome, left leg: Secondary | ICD-10-CM

## 2020-01-07 DIAGNOSIS — E876 Hypokalemia: Secondary | ICD-10-CM

## 2020-01-07 DIAGNOSIS — M79605 Pain in left leg: Secondary | ICD-10-CM | POA: Diagnosis not present

## 2020-01-07 LAB — COMPREHENSIVE METABOLIC PANEL
ALT: 9 U/L (ref 0–35)
AST: 14 U/L (ref 0–37)
Albumin: 4.2 g/dL (ref 3.5–5.2)
Alkaline Phosphatase: 59 U/L (ref 39–117)
BUN: 12 mg/dL (ref 6–23)
CO2: 31 mEq/L (ref 19–32)
Calcium: 9.3 mg/dL (ref 8.4–10.5)
Chloride: 101 mEq/L (ref 96–112)
Creatinine, Ser: 1.01 mg/dL (ref 0.40–1.20)
GFR: 65.06 mL/min (ref >60.00)
Glucose, Bld: 114 mg/dL — ABNORMAL HIGH (ref 70–99)
Potassium: 3.8 mEq/L (ref 3.5–5.1)
Sodium: 138 mEq/L (ref 135–145)
Total Bilirubin: 0.6 mg/dL (ref 0.2–1.2)
Total Protein: 6.8 g/dL (ref 6.0–8.3)

## 2020-01-07 LAB — HEMOGLOBIN A1C: Hgb A1c MFr Bld: 5.2 % (ref 4.6–6.5)

## 2020-01-07 LAB — LIPID PANEL
Cholesterol: 170 mg/dL (ref 0–200)
HDL: 43.1 mg/dL (ref >39.00)
LDL Cholesterol: 106 mg/dL — ABNORMAL HIGH (ref 0–99)
NonHDL: 126.83
Total CHOL/HDL Ratio: 4
Triglycerides: 103 mg/dL (ref 0.0–149.0)
VLDL: 20.6 mg/dL (ref 0.0–40.0)

## 2020-01-07 MED ORDER — GABAPENTIN 300 MG PO CAPS: 300.0000 mg | ORAL_CAPSULE | Freq: Every day | ORAL | 3 refills | Status: DC

## 2020-01-07 NOTE — Progress Notes (Signed)
Subjective:  Patient ID: Ebony Mayer, female    DOB: October 27, 1945  Age: 74 y.o. MRN: [id number]  CC: Follow-up (6 mo follow up BP and lipid/fasting-- going to have 2nd covid shot on 01/14/20--denie other shot today)  HPI HTN: BP at goal with amlodipine and HCTZ Denies any acute compliant BP Readings from Last 3 Encounters:  01/07/20 130/80  05/25/19 126/68  11/08/18 120/64   Hyperlipidemia: Diet controlled. Lipid Panel     Component Value Date/Time   CHOL 170 01/07/2020 1201   TRIG 103.0 01/07/2020 1201   HDL 43.10 01/07/2020 1201   CHOLHDL 4 01/07/2020 1201   VLDL 20.6 01/07/2020 1201   LDLCALC 106 (H) 01/07/2020 1201   Reviewed past Medical, Social and Family history today.  Outpatient Medications Prior to Visit  Medication Sig Dispense Refill  . diclofenac sodium (VOLTAREN) 1 % GEL Apply 2 g topically 3 (three) times daily as needed. 100 g 5  . amLODipine (NORVASC) 5 MG tablet Take 1 tablet (5 mg total) by mouth at bedtime. Need office visit for additional refills 90 tablet 1  . gabapentin (NEURONTIN) 300 MG capsule Take 1 capsule (300 mg total) by mouth at bedtime. 90 capsule 3  . hydrochlorothiazide (HYDRODIURIL) 25 MG tablet Take 1 tablet (25 mg total) by mouth daily. Need office visit for additional refills 30 tablet 0  . potassium chloride SA (KLOR-CON) 20 MEQ tablet Take 1 tablet (20 mEq total) by mouth daily. 30 tablet 0   No facility-administered medications prior to visit.    ROS See HPI  Objective:  BP 130/80   Pulse 99   Temp 98 F (36.7 C) (Tympanic)   Ht 5\' 5"  (1.651 m)   Wt 154 lb 9.6 oz (70.1 kg)   SpO2 99%   BMI 25.73 kg/m   BP Readings from Last 3 Encounters:  01/07/20 130/80  05/25/19 126/68  11/08/18 120/64    Wt Readings from Last 3 Encounters:  01/07/20 154 lb 9.6 oz (70.1 kg)  05/25/19 143 lb (64.9 kg)  11/08/18 140 lb (63.5 kg)    Physical Exam Vitals reviewed.  Cardiovascular:     Rate and Rhythm: Normal rate and regular  rhythm.     Pulses: Normal pulses.     Heart sounds: Normal heart sounds.  Pulmonary:     Effort: Pulmonary effort is normal.     Breath sounds: Normal breath sounds.  Musculoskeletal:     Right lower leg: No edema.     Left lower leg: No edema.  Neurological:     Mental Status: She is alert and oriented to person, place, and time.  Psychiatric:        Mood and Affect: Mood normal.        Behavior: Behavior normal.     Lab Results  Component Value Date   WBC 3.6 (L) 11/08/2018   HGB 13.0 11/08/2018   HCT 38.9 11/08/2018   PLT 167.0 11/08/2018   GLUCOSE 114 (H) 01/07/2020   CHOL 170 01/07/2020   TRIG 103.0 01/07/2020   HDL 43.10 01/07/2020   LDLCALC 106 (H) 01/07/2020   ALT 9 01/07/2020   AST 14 01/07/2020   NA 138 01/07/2020   K 3.8 01/07/2020   CL 101 01/07/2020   CREATININE 1.01 01/07/2020   BUN 12 01/07/2020   CO2 31 01/07/2020   TSH 0.69 11/08/2018   HGBA1C 5.2 01/07/2020    Assessment & Plan:  This visit occurred during the SARS-CoV-2 public health emergency.  Safety protocols were in place, including screening questions prior to the visit, additional usage of staff PPE, and extensive cleaning of exam room while observing appropriate contact time as indicated for disinfecting solutions.   Vernesha was seen today for follow-up.  Diagnoses and all orders for this visit:  Mixed hyperlipidemia -     Lipid panel -     Comprehensive metabolic panel  Essential hypertension, benign -     Comprehensive metabolic panel -     amLODipine (NORVASC) 5 MG tablet; Take 1 tablet (5 mg total) by mouth at bedtime. -     hydrochlorothiazide (HYDRODIURIL) 25 MG tablet; Take 1 tablet (25 mg total) by mouth daily. -     potassium chloride SA (KLOR-CON) 20 MEQ tablet; Take 1 tablet (20 mEq total) by mouth daily.  Hyperglycemia -     Hemoglobin A1c  It band syndrome, left -     gabapentin (NEURONTIN) 300 MG capsule; Take 1 capsule (300 mg total) by mouth at bedtime.  Pain in  lateral left lower extremity -     gabapentin (NEURONTIN) 300 MG capsule; Take 1 capsule (300 mg total) by mouth at bedtime.  Diuretic-induced hypokalemia -     potassium chloride SA (KLOR-CON) 20 MEQ tablet; Take 1 tablet (20 mEq total) by mouth daily.   I have changed Angelik Herbster's amLODipine and hydrochlorothiazide. I am also having her maintain her diclofenac sodium, gabapentin, and potassium chloride SA.  Meds ordered this encounter  Medications  . gabapentin (NEURONTIN) 300 MG capsule    Sig: Take 1 capsule (300 mg total) by mouth at bedtime.    Dispense:  90 capsule    Refill:  3  . amLODipine (NORVASC) 5 MG tablet    Sig: Take 1 tablet (5 mg total) by mouth at bedtime.    Dispense:  90 tablet    Refill:  3    Order Specific Question:   Supervising Provider    Answer:   Overton Mam [[id number]]  . hydrochlorothiazide (HYDRODIURIL) 25 MG tablet    Sig: Take 1 tablet (25 mg total) by mouth daily.    Dispense:  90 tablet    Refill:  1    Order Specific Question:   Supervising Provider    Answer:   Overton Mam [id number]  . potassium chloride SA (KLOR-CON) 20 MEQ tablet    Sig: Take 1 tablet (20 mEq total) by mouth daily.    Dispense:  90 tablet    Refill:  1    Order Specific Question:   Supervising Provider    Answer:   Overton Mam [[id number]]    Problem List Items Addressed This Visit      Cardiovascular and Mediastinum   Essential hypertension, benign    BP at goal with amlodipine and HCTZ BP Readings from Last 3 Encounters:  01/07/20 130/80  05/25/19 126/68  11/08/18 120/64        Relevant Medications   amLODipine (NORVASC) 5 MG tablet   hydrochlorothiazide (HYDRODIURIL) 25 MG tablet   potassium chloride SA (KLOR-CON) 20 MEQ tablet   Other Relevant Orders   Comprehensive metabolic panel (Completed)     Musculoskeletal and Integument   It band syndrome, left   Relevant Medications   gabapentin (NEURONTIN) 300 MG capsule     Other    Hyperglycemia    Diet controlled hgbA1c of 5.2      Relevant Orders   Hemoglobin A1c (Completed)   Leg pain, lateral  Relevant Medications   gabapentin (NEURONTIN) 300 MG capsule   Mixed hyperlipidemia - Primary    Diet controlled. Lipid Panel     Component Value Date/Time   CHOL 170 01/07/2020 1201   TRIG 103.0 01/07/2020 1201   HDL 43.10 01/07/2020 1201   CHOLHDL 4 01/07/2020 1201   VLDL 20.6 01/07/2020 1201   LDLCALC 106 (H) 01/07/2020 1201        Relevant Medications   amLODipine (NORVASC) 5 MG tablet   hydrochlorothiazide (HYDRODIURIL) 25 MG tablet   Other Relevant Orders   Lipid panel (Completed)   Comprehensive metabolic panel (Completed)    Other Visit Diagnoses    Diuretic-induced hypokalemia       Relevant Medications   potassium chloride SA (KLOR-CON) 20 MEQ tablet       Follow-up: Return in about 6 months (around 07/08/2020) for HTN and, hyperlipidemia.  Wilfred Lacy, NP

## 2020-01-07 NOTE — Telephone Encounter (Signed)
Please advise 

## 2020-01-07 NOTE — Patient Instructions (Addendum)
Go to lab for blood draw.  Will send med refill after lab result review.

## 2020-01-07 NOTE — Telephone Encounter (Signed)
Ok to refill 

## 2020-01-07 NOTE — Telephone Encounter (Signed)
Patient needs refill of gabapentin 300 mg at bedtime sent into [facility].   Thanks  Garey Ham Clinical Pharmacist  Yolanda Manges  [phone]

## 2020-01-07 NOTE — Telephone Encounter (Signed)
Rx sent 

## 2020-01-08 ENCOUNTER — Telehealth: Payer: Self-pay | Admitting: Nurse Practitioner

## 2020-01-08 NOTE — Telephone Encounter (Signed)
Pt would like Pete to call her back when able, [phone]

## 2020-01-08 NOTE — Telephone Encounter (Signed)
Its done. Thank you.

## 2020-01-08 NOTE — Telephone Encounter (Signed)
done

## 2020-01-09 MED ORDER — HYDROCHLOROTHIAZIDE 25 MG PO TABS: 25.0000 mg | ORAL_TABLET | Freq: Every day | ORAL | 1 refills | Status: DC

## 2020-01-09 MED ORDER — AMLODIPINE BESYLATE 5 MG PO TABS: 5.0000 mg | ORAL_TABLET | Freq: Every day | ORAL | 3 refills | Status: DC

## 2020-01-09 MED ORDER — POTASSIUM CHLORIDE CRYS ER 20 MEQ PO TBCR: 20.0000 meq | EXTENDED_RELEASE_TABLET | Freq: Every day | ORAL | 1 refills | Status: DC

## 2020-01-09 NOTE — Assessment & Plan Note (Signed)
Diet controlled hgbA1c of 5.2

## 2020-01-09 NOTE — Assessment & Plan Note (Signed)
Diet controlled. Lipid Panel     Component Value Date/Time   CHOL 170 01/07/2020 1201   TRIG 103.0 01/07/2020 1201   HDL 43.10 01/07/2020 1201   CHOLHDL 4 01/07/2020 1201   VLDL 20.6 01/07/2020 1201   LDLCALC 106 (H) 01/07/2020 1201

## 2020-01-09 NOTE — Assessment & Plan Note (Signed)
BP at goal with amlodipine and HCTZ BP Readings from Last 3 Encounters:  01/07/20 130/80  05/25/19 126/68  11/08/18 120/64

## 2020-01-14 ENCOUNTER — Ambulatory Visit: Payer: Medicare HMO | Attending: Internal Medicine

## 2020-01-14 DIAGNOSIS — Z23 Encounter for immunization: Secondary | ICD-10-CM

## 2020-01-14 NOTE — Progress Notes (Signed)
   Covid-19 Vaccination Clinic  Name:  Othel Dicostanzo    MRN: [id number] DOB: Nov 29, 1945  01/14/2020  Ms. Vuncannon was observed post Covid-19 immunization for 15 minutes without incident. She was provided with Vaccine Information Sheet and instruction to access the V-Safe system.   Ms. Downs was instructed to call 911 with any severe reactions post vaccine: Marland Kitchen Difficulty breathing  . Swelling of face and throat  . A fast heartbeat  . A bad rash all over body  . Dizziness and weakness   Immunizations Administered    Name Date Dose VIS Date Route   Pfizer COVID-19 Vaccine 01/14/2020  9:24 AM 0.3 mL 11/07/2018 Intramuscular   Manufacturer: [employer], [employer]   Lot: [id number]   NDC: [address]-9

## 2020-01-22 ENCOUNTER — Ambulatory Visit: Payer: Self-pay

## 2020-01-22 DIAGNOSIS — I10 Essential (primary) hypertension: Secondary | ICD-10-CM

## 2020-01-22 DIAGNOSIS — E782 Mixed hyperlipidemia: Secondary | ICD-10-CM

## 2020-01-22 NOTE — Chronic Care Management (AMB) (Signed)
Reviewed chart for medication changes ahead of medication coordination call.  01/07/20: Patient presented to [name], NP for follow-up. No medication changes made.  No medication changes indicated.  BP Readings from Last 3 Encounters:  01/07/20 130/80  05/25/19 126/68  11/08/18 120/64    Lab Results  Component Value Date   HGBA1C 5.2 01/07/2020     Patient obtains medications through Adherence Packaging  30 Days   Last adherence delivery included: n/a  Patient is due for next adherence delivery on: 01/29/20. Called patient and reviewed medications and coordinated delivery.  This delivery to include:  Hydrochlorothiazide 25mg  daily-Breakfast   Potassium Cl Er 20 meq daily- Breakfast   Amlodipine 5mg  daily- bedtime   Gabapentin 300mg  daily -bedtime   Patient declined the following medications:   Diclofenac 1% gel (adequate supply)  Confirmed delivery date of 01/29/20, advised patient that pharmacy will contact them the morning of delivery.  Clinical Pharmacist at [facility]  [phone]

## 2020-02-26 ENCOUNTER — Ambulatory Visit: Payer: Self-pay

## 2020-02-26 DIAGNOSIS — I10 Essential (primary) hypertension: Secondary | ICD-10-CM

## 2020-02-26 DIAGNOSIS — E782 Mixed hyperlipidemia: Secondary | ICD-10-CM

## 2020-02-26 NOTE — Chronic Care Management (AMB) (Signed)
Reviewed chart for medication changes ahead of medication coordination call.  No OVs, Consults, or hospital visits since last care coordination call/Pharmacist visit.  No medication changes indicated  BP Readings from Last 3 Encounters:  01/07/20 130/80  05/25/19 126/68  11/08/18 120/64    Lab Results  Component Value Date   HGBA1C 5.2 01/07/2020     Patient obtains medications through Adherence Packaging  30 Days   Last adherence delivery included:   Hydrochlorothiazide 25mg  daily-Breakfast              Potassium Cl Er 20 meq daily- Breakfast              Amlodipine 5mg  daily- bedtime              Gabapentin 300mg  daily -bedtime   Patient is due for next adherence delivery on: 02/28/20. Called patient and reviewed medications and coordinated delivery.  This delivery to include:  Hydrochlorothiazide 25mg  daily-Breakfast              Potassium Cl Er 20 meq daily- Breakfast              Amlodipine 5mg  daily- bedtime              Gabapentin 300mg  daily -bedtime   Patient declined the following medications (meds) due to (reason)  Diclofenac 1% gel (adequate supply)  Confirmed delivery date of 02/28/20, advised patient that pharmacy will contact them the morning of delivery.  Clinical Pharmacist 03/01/20 at [facility]  [phone]

## 2020-03-24 ENCOUNTER — Other Ambulatory Visit: Payer: Self-pay

## 2020-03-24 ENCOUNTER — Telehealth: Payer: Self-pay

## 2020-03-24 ENCOUNTER — Ambulatory Visit: Payer: Self-pay

## 2020-03-24 DIAGNOSIS — I10 Essential (primary) hypertension: Secondary | ICD-10-CM

## 2020-03-24 DIAGNOSIS — E782 Mixed hyperlipidemia: Secondary | ICD-10-CM

## 2020-03-24 DIAGNOSIS — M1712 Unilateral primary osteoarthritis, left knee: Secondary | ICD-10-CM

## 2020-03-24 MED ORDER — DICLOFENAC SODIUM 1 % EX GEL: 2.0000 g | Freq: Four times a day (QID) | CUTANEOUS | 1 refills | Status: DC

## 2020-03-24 NOTE — Telephone Encounter (Signed)
Patient is requesting refill of Voltaren 1% gel be sent to [facility].

## 2020-03-24 NOTE — Chronic Care Management (AMB) (Signed)
Reviewed chart for medication changes ahead of medication coordination call.  No OVs, Consults, or hospital visits since last care coordination call/Pharmacist visit. No medication changes indicated.  BP Readings from Last 3 Encounters:  01/07/20 130/80  05/25/19 126/68  11/08/18 120/64    Lab Results  Component Value Date   HGBA1C 5.2 01/07/2020     Patient has not been checking blood pressures at home recently. Instructed patient to resume sporadically checking blood pressures, and she states she will have her daughter help her monitor them.   Patient obtains medications through Adherence Packaging  30 Days   Last adherence delivery included: (medication name and frequency)   Hydrochlorothiazide 25mg  daily-Breakfast  Potassium Cl Er 20 meq daily- Breakfast  Amlodipine 5mg  daily- bedtime  Gabapentin 300mg  daily -bedtime  Patient is due for next adherence delivery on: 03/28/20. Called patient and reviewed medications and coordinated delivery.  Patient has started taking aspirin 81 mg daily to protect her heart and wanted to know what she should take to protect her bones. Given her last DEXA scan in 2017 revealed Osteopenia, recommended patient begin taking a calcium + vitamin D supplement. She is also requesting a refill of her Voltaren gel as she has been having more knee pain recently.  This delivery to include: Gabapentin 300 mg daily - Bedtime Amlodipine 5 mg daily - Bedtime Hydrochlorothiazide 25 mg daily - Breakfast Potassium Cl ER 20 Meq daily - Breakfast  Voltaren 1% gel PRN Aspirin 81 mg daily with breakfast - Vial  Calcium Carbonate + D 600 mg daily with breakfast- Vial  Patient needs refills for Voltaren Gel 1%. Request sent to patient's PCP.   Confirmed delivery date of 03/28/20, advised patient that pharmacy will contact them the morning of delivery.  03/30/20 Clinical Pharmacist 2018 at [facility]   [phone]

## 2020-03-24 NOTE — Telephone Encounter (Signed)
Reviewed chart, medication sent to pharmacy

## 2020-04-21 ENCOUNTER — Telehealth: Payer: Self-pay

## 2020-04-21 DIAGNOSIS — I10 Essential (primary) hypertension: Secondary | ICD-10-CM

## 2020-04-21 DIAGNOSIS — E782 Mixed hyperlipidemia: Secondary | ICD-10-CM

## 2020-04-21 NOTE — Progress Notes (Signed)
° ° °  Chronic Care Management Pharmacy Assistant   Name: Ebony Mayer  MRN: [id number] DOB: 1945-09-16  Reason for Encounter: Medication Review  Patient Questions:  1.  Have you seen any other providers since your last visit? No  2.  Any changes in your medicines or health? No    PCP : Anne Ng, NP  Allergies:  No Known Allergies  Medications: Outpatient Encounter Medications as of 04/21/2020  Medication Sig   amLODipine (NORVASC) 5 MG tablet Take 1 tablet (5 mg total) by mouth at bedtime.   aspirin EC 81 MG tablet Take 81 mg by mouth daily. Swallow whole.   Calcium Carbonate-Vitamin D 600-400 MG-UNIT tablet Take 1 tablet by mouth daily.   diclofenac sodium (VOLTAREN) 1 % GEL Apply 2 g topically 3 (three) times daily as needed.   diclofenac Sodium (VOLTAREN) 1 % GEL Apply 2 g topically 4 (four) times daily.   gabapentin (NEURONTIN) 300 MG capsule Take 1 capsule (300 mg total) by mouth at bedtime.   hydrochlorothiazide (HYDRODIURIL) 25 MG tablet Take 1 tablet (25 mg total) by mouth daily.   potassium chloride SA (KLOR-CON) 20 MEQ tablet Take 1 tablet (20 mEq total) by mouth daily.   No facility-administered encounter medications on file as of 04/21/2020.    Current Diagnosis: Patient Active Problem List   Diagnosis Date Noted   Mixed hyperlipidemia 10/19/2017   Hyperglycemia 10/19/2017   Patellofemoral arthritis of left knee 07/08/2016   It band syndrome, left 06/15/2016   Essential hypertension, benign 05/31/2016   Leg pain, lateral 05/31/2016   Varicose veins of left lower extremity with pain 05/31/2016   Onychomycosis 10/10/2013   Porokeratosis 10/10/2013    Goals Addressed   None     Follow-Up:  Pharmacist Review  Reviewed chart for medication changes ahead of medication coordination call.  No OVs, Consults, or hospital visits since last care coordination call/Pharmacist visit.   No medication changes indicated   BP Readings from  Last 3 Encounters:  01/07/20 130/80  05/25/19 126/68  11/08/18 120/64    Lab Results  Component Value Date   HGBA1C 5.2 01/07/2020     Patient obtains medications through Adherence Packaging  30 Days   Last adherence delivery included: (medication name and frequency) Gabapentin 300 mg daily - Bedtime Amlodipine 5 mg daily - Bedtime Hydrochlorothiazide 25 mg daily - Breakfast Potassium Cl ER 20 Meq daily - Breakfast  Voltaren 1% gel PRN Aspirin 81 mg daily with breakfast - Vial       Calcium Carbonate + D 600 mg daily with breakfast- Vial   Patient is due for next adherence delivery on: 04/28/2020. Called patient and reviewed medications and coordinated delivery.  This delivery to include: Gabapentin 300 mg daily - Bedtime Amlodipine 5 mg daily - Bedtime Hydrochlorothiazide 25 mg daily - Breakfast       Potassium Cl ER 20 Meq daily - Breakfast  Aspirin 81 mg daily with breakfast - Vial  Calcium Carbonate + D 600 mg daily with breakfast- Vial  Patient declined the following medications (meds) due to (reason)  Voltaren 1% gel PRN (adquate supply)     Patient needs refills for None ID.  Confirmed delivery date of 04/28/2020, advised patient that pharmacy will contact them the morning of delivery.   Everlean Cherry Clinical Pharmacist Assistant [phone]

## 2020-05-22 ENCOUNTER — Telehealth: Payer: Self-pay

## 2020-05-22 DIAGNOSIS — I10 Essential (primary) hypertension: Secondary | ICD-10-CM

## 2020-05-22 DIAGNOSIS — E782 Mixed hyperlipidemia: Secondary | ICD-10-CM

## 2020-05-22 NOTE — Progress Notes (Addendum)
    Chronic Care Management Pharmacy Assistant   Name: Ebony Mayer  MRN: [id number] DOB: 23-Mar-1946  Reason for Encounter: Medication Review   PCP : Anne Ng, NP  Allergies:  No Known Allergies  Medications: Outpatient Encounter Medications as of 05/22/2020  Medication Sig  . amLODipine (NORVASC) 5 MG tablet Take 1 tablet (5 mg total) by mouth at bedtime.  Marland Kitchen aspirin EC 81 MG tablet Take 81 mg by mouth daily. Swallow whole.  . Calcium Carbonate-Vitamin D 600-400 MG-UNIT tablet Take 1 tablet by mouth daily.  . diclofenac sodium (VOLTAREN) 1 % GEL Apply 2 g topically 3 (three) times daily as needed.  . diclofenac Sodium (VOLTAREN) 1 % GEL Apply 2 g topically 4 (four) times daily.  Marland Kitchen gabapentin (NEURONTIN) 300 MG capsule Take 1 capsule (300 mg total) by mouth at bedtime.  . hydrochlorothiazide (HYDRODIURIL) 25 MG tablet Take 1 tablet (25 mg total) by mouth daily.  . potassium chloride SA (KLOR-CON) 20 MEQ tablet Take 1 tablet (20 mEq total) by mouth daily.   No facility-administered encounter medications on file as of 05/22/2020.    Current Diagnosis: Patient Active Problem List   Diagnosis Date Noted  . Mixed hyperlipidemia 10/19/2017  . Hyperglycemia 10/19/2017  . Patellofemoral arthritis of left knee 07/08/2016  . It band syndrome, left 06/15/2016  . Essential hypertension, benign 05/31/2016  . Leg pain, lateral 05/31/2016  . Varicose veins of left lower extremity with pain 05/31/2016  . Onychomycosis 10/10/2013  . Porokeratosis 10/10/2013   Follow-Up:  Pharmacist Review   Reviewed chart for medication changes ahead of medication coordination call.  No OVs, Consults, or hospital visits since last care coordination call/Pharmacist visit.   No medication changes indicated   BP Readings from Last 3 Encounters:  01/07/20 130/80  05/25/19 126/68  11/08/18 120/64    Lab Results  Component Value Date   HGBA1C 5.2 01/07/2020     Patient obtains medications  through Adherence Packaging  30 Days   Last adherence delivery included: (medication name and frequency)  Gabapentin 300 mg daily -Bedtime  Amlodipine 5 mg daily -Bedtime  Hydrochlorothiazide 25 mg daily -Breakfast       Potassium Cl ER 20 Meq daily -Breakfast   Aspirin 81 mg daily with breakfast -Vial   Calcium Carbonate + D 600 mg daily with breakfast-Vial  Patient declined (meds) last month due to PRN use/additional supply on hand.  Voltaren 1% gel PRN (adquate supply)    Patient is due for next adherence delivery on: 05/28/2020. Called patient and reviewed medications and coordinated delivery.  This delivery to include:  Gabapentin 300 mg daily -Bedtime  Amlodipine 5 mg daily -Bedtime  Hydrochlorothiazide 25 mg daily -Breakfast  Potassium Cl ER 20 Meq daily -Breakfast   Aspirin 81 mg daily with breakfast -Vial   Calcium Carbonate + D 600 mg daily with breakfast-Vial  Voltaren 1% gel PRN   Patient declined the following medications (meds) due to (reason)  None ID Patient needs refills for None ID.  Confirmed delivery date of 05/28/2020, advised patient that pharmacy will contact them the morning of delivery.   Everlean Cherry Clinical Pharmacist Assistant [phone]

## 2020-06-17 ENCOUNTER — Telehealth: Payer: Self-pay

## 2020-06-17 DIAGNOSIS — I10 Essential (primary) hypertension: Secondary | ICD-10-CM

## 2020-06-17 DIAGNOSIS — I83812 Varicose veins of left lower extremities with pain: Secondary | ICD-10-CM

## 2020-06-17 DIAGNOSIS — E782 Mixed hyperlipidemia: Secondary | ICD-10-CM

## 2020-06-17 NOTE — Progress Notes (Addendum)
Chronic Care Management Pharmacy Assistant   Name: Natalyah Cummiskey  MRN: [id number] DOB: [birth year]  Reason for Encounter: Medication Review   PCP : Anne Ng, NP  Allergies:  No Known Allergies  Medications: Outpatient Encounter Medications as of 06/17/2020  Medication Sig  . amLODipine (NORVASC) 5 MG tablet Take 1 tablet (5 mg total) by mouth at bedtime.  Marland Kitchen aspirin EC 81 MG tablet Take 81 mg by mouth daily. Swallow whole.  . Calcium Carbonate-Vitamin D 600-400 MG-UNIT tablet Take 1 tablet by mouth daily.  . diclofenac sodium (VOLTAREN) 1 % GEL Apply 2 g topically 3 (three) times daily as needed.  . diclofenac Sodium (VOLTAREN) 1 % GEL Apply 2 g topically 4 (four) times daily.  Marland Kitchen gabapentin (NEURONTIN) 300 MG capsule Take 1 capsule (300 mg total) by mouth at bedtime.  . hydrochlorothiazide (HYDRODIURIL) 25 MG tablet Take 1 tablet (25 mg total) by mouth daily.  . potassium chloride SA (KLOR-CON) 20 MEQ tablet Take 1 tablet (20 mEq total) by mouth daily.   No facility-administered encounter medications on file as of 06/17/2020.    Current Diagnosis: Patient Active Problem List   Diagnosis Date Noted  . Mixed hyperlipidemia 10/19/2017  . Hyperglycemia 10/19/2017  . Patellofemoral arthritis of left knee 07/08/2016  . It band syndrome, left 06/15/2016  . Essential hypertension, benign 05/31/2016  . Leg pain, lateral 05/31/2016  . Varicose veins of left lower extremity with pain 05/31/2016  . Onychomycosis 10/10/2013  . Porokeratosis 10/10/2013     Follow-Up:  Pharmacist Review   Reviewed chart for medication changes ahead of medication coordination call.  No OVs, Consults, or hospital visits since last care coordination call/Pharmacist visit.   No medication changes indicated.  BP Readings from Last 3 Encounters:  01/07/20 130/80  05/25/19 126/68  11/08/18 120/64    Lab Results  Component Value Date   HGBA1C 5.2 01/07/2020     Patient obtains  medications through Adherence Packaging  30 Days   Last adherence delivery included: (medication name and frequency)   Gabapentin 300 mg daily -Bedtime       Amlodipine 5 mg daily -Bedtime             Hydrochlorothiazide 25 mg daily -Breakfast             Potassium Cl ER 20 Meq daily -Breakfast        Aspirin 81 mg daily with breakfast -Vial              Calcium Carbonate + D 600 mg daily with breakfast-Vial             Voltaren 1% gel PRN  Patient declined (meds) last month due to PRN use/additional supply on hand.      None ID  Patient is due for next adherence delivery on: 06/24/2020. Called patient and reviewed medications and coordinated delivery.  This delivery to include:   Gabapentin 300 mg daily -Bedtime       Amlodipine 5 mg daily -Bedtime             Hydrochlorothiazide 25 mg daily -Breakfast             Potassium Cl ER 20 Meq daily -Breakfast        Aspirin 81 mg daily with breakfast -Vial              Calcium Carbonate + D 600 mg daily with breakfast-Vial  Voltaren 1% gel PRN  Patient declined the following medications (meds) due to (reason)  None ID  Patient needs refills for Diclofenac Gel 1%.  Confirmed delivery date of 06/24/2020, advised patient that pharmacy will contact them the morning of delivery.  Patient states she has "spider looking veins on her legs and would like to know what she could take for it."  Everlean Cherry Clinical Pharmacist Assistant [phone]    Addendum 06/18/20: -Advised to avoid standing for too long to minimize varicose veins. Ensure she maintains good blood circulation. She can also try compression stockings. Recommend she discuss in more detail with PCP at office visit if still a concern.   Garey Ham Clinical Pharmacist [facility] at [facility]  [phone]

## 2020-06-23 ENCOUNTER — Telehealth: Payer: Self-pay | Admitting: Nurse Practitioner

## 2020-06-23 MED ORDER — DICLOFENAC SODIUM 1 % EX GEL: 2.0000 g | Freq: Three times a day (TID) | CUTANEOUS | 0 refills | Status: DC

## 2020-06-23 NOTE — Telephone Encounter (Signed)
voltaren gel sent

## 2020-06-23 NOTE — Telephone Encounter (Signed)
-----   Message from Gaspar Cola, [facility] sent at 06/18/2020  3:35 PM EDT ----- Ebony Mayer is requesting a refill of her Voltaren 1% gel. Can you please send this into the pharmacy for her? She has also been having some concerns with her varicose veins that she wishes to talk with you about at your visit later this month.Thanks, Lorrene Reid [facility] [phone]

## 2020-06-24 ENCOUNTER — Telehealth: Payer: Medicare HMO

## 2020-07-08 ENCOUNTER — Other Ambulatory Visit: Payer: Self-pay

## 2020-07-09 ENCOUNTER — Encounter: Payer: Self-pay | Admitting: Nurse Practitioner

## 2020-07-09 ENCOUNTER — Ambulatory Visit (INDEPENDENT_AMBULATORY_CARE_PROVIDER_SITE_OTHER): Payer: Medicare HMO | Admitting: Nurse Practitioner

## 2020-07-09 VITALS — BP 128/70 | HR 100 | Temp 96.0°F | Ht 65.0 in | Wt 160.2 lb

## 2020-07-09 DIAGNOSIS — I10 Essential (primary) hypertension: Secondary | ICD-10-CM

## 2020-07-09 DIAGNOSIS — Z23 Encounter for immunization: Secondary | ICD-10-CM

## 2020-07-09 DIAGNOSIS — E782 Mixed hyperlipidemia: Secondary | ICD-10-CM

## 2020-07-09 LAB — BASIC METABOLIC PANEL
BUN: 13 mg/dL (ref 6–23)
CO2: 32 mEq/L (ref 19–32)
Calcium: 9.5 mg/dL (ref 8.4–10.5)
Chloride: 100 mEq/L (ref 96–112)
Creatinine, Ser: 0.96 mg/dL (ref 0.40–1.20)
GFR: 58.8 mL/min — ABNORMAL LOW (ref >60.00)
Glucose, Bld: 103 mg/dL — ABNORMAL HIGH (ref 70–99)
Potassium: 3.8 mEq/L (ref 3.5–5.1)
Sodium: 139 mEq/L (ref 135–145)

## 2020-07-09 LAB — TSH: TSH: 1.21 u[IU]/mL (ref 0.35–4.50)

## 2020-07-09 MED ORDER — TETANUS-DIPHTH-ACELL PERTUSSIS 5-2.5-18.5 LF-MCG/0.5 IM SUSP: 0.5000 mL | Freq: Once | INTRAMUSCULAR | 0 refills | Status: AC

## 2020-07-09 NOTE — Progress Notes (Signed)
Subjective:  Patient ID: Ebony Mayer, female    DOB: Feb 09, 1946  Age: 74 y.o. MRN: [id number]  CC: Follow-up (6 month f/u/ Pt declines flu vaccine)  HPI  Mixed hyperlipidemia ASCVD risk of 11.3%. Declined use of statin at this time Lipid Panel     Component Value Date/Time   CHOL 170 01/07/2020 1201   TRIG 103.0 01/07/2020 1201   HDL 43.10 01/07/2020 1201   CHOLHDL 4 01/07/2020 1201   VLDL 20.6 01/07/2020 1201   LDLCALC 106 (H) 01/07/2020 1201    Essential hypertension, benign BP at goal with amlodipine and HCTZ BP Readings from Last 3 Encounters:  07/09/20 128/70  01/07/20 130/80  05/25/19 126/68   Repeat BMP Maintain current medication  Reviewed past Medical, Social and Family history today.  Outpatient Medications Prior to Visit  Medication Sig Dispense Refill  . amLODipine (NORVASC) 5 MG tablet Take 1 tablet (5 mg total) by mouth at bedtime. 90 tablet 3  . aspirin EC 81 MG tablet Take 81 mg by mouth daily. Swallow whole.    . Calcium Carbonate-Vitamin D 600-400 MG-UNIT tablet Take 1 tablet by mouth daily.    . diclofenac Sodium (VOLTAREN) 1 % GEL Apply 2 g topically in the morning, at noon, and at bedtime. 100 g 0  . gabapentin (NEURONTIN) 300 MG capsule Take 1 capsule (300 mg total) by mouth at bedtime. 90 capsule 3  . hydrochlorothiazide (HYDRODIURIL) 25 MG tablet Take 1 tablet (25 mg total) by mouth daily. 90 tablet 1  . potassium chloride SA (KLOR-CON) 20 MEQ tablet Take 1 tablet (20 mEq total) by mouth daily. 90 tablet 1   No facility-administered medications prior to visit.    ROS See HPI  Objective:  BP 128/70 (BP Location: Left Arm, Patient Position: Sitting, Cuff Size: Normal)   Pulse 100   Temp (!) 96 F (35.6 C) (Temporal)   Ht 5\' 5"  (1.651 m)   Wt 160 lb 3.2 oz (72.7 kg)   SpO2 100%   BMI 26.66 kg/m   Physical Exam Cardiovascular:     Rate and Rhythm: Normal rate and regular rhythm.     Pulses: Normal pulses.     Heart sounds:  Normal heart sounds.  Pulmonary:     Effort: Pulmonary effort is normal.     Breath sounds: Normal breath sounds.  Musculoskeletal:     Cervical back: Normal range of motion and neck supple.     Right lower leg: No edema.     Left lower leg: No edema.  Neurological:     Mental Status: She is alert and oriented to person, place, and time.    Assessment & Plan:  This visit occurred during the SARS-CoV-2 public health emergency.  Safety protocols were in place, including screening questions prior to the visit, additional usage of staff PPE, and extensive cleaning of exam room while observing appropriate contact time as indicated for disinfecting solutions.   Ebony Mayer was seen today for follow-up.  Diagnoses and all orders for this visit:  Essential hypertension, benign -     Basic metabolic panel -     TSH  Need for diphtheria-tetanus-pertussis (Tdap) vaccine -     Tdap (BOOSTRIX) 5-2.5-18.5 LF-MCG/0.5 injection; Inject 0.5 mLs into the muscle once for 1 dose.  Mixed hyperlipidemia    Problem List Items Addressed This Visit      Cardiovascular and Mediastinum   Essential hypertension, benign - Primary    BP at goal with amlodipine and  HCTZ BP Readings from Last 3 Encounters:  07/09/20 128/70  01/07/20 130/80  05/25/19 126/68   Repeat BMP Maintain current medication      Relevant Orders   Basic metabolic panel   TSH     Other   Mixed hyperlipidemia    ASCVD risk of 11.3%. Declined use of statin at this time Lipid Panel     Component Value Date/Time   CHOL 170 01/07/2020 1201   TRIG 103.0 01/07/2020 1201   HDL 43.10 01/07/2020 1201   CHOLHDL 4 01/07/2020 1201   VLDL 20.6 01/07/2020 1201   LDLCALC 106 (H) 01/07/2020 1201         Other Visit Diagnoses    Need for diphtheria-tetanus-pertussis (Tdap) vaccine       Relevant Medications   Tdap (BOOSTRIX) 5-2.5-18.5 LF-MCG/0.5 injection      Follow-up: Return in about 6 months (around 01/07/2021) for HTN and ,  hyperlipidemia (fasting).  [name], NP

## 2020-07-09 NOTE — Patient Instructions (Signed)
Go to lab for blood draw Maintain current medications.  Schedule nurse visit for flu vaccine if you change your mind  Go to retail pharmacy for TDAP vaccine

## 2020-07-09 NOTE — Assessment & Plan Note (Signed)
BP at goal with amlodipine and HCTZ BP Readings from Last 3 Encounters:  07/09/20 128/70  01/07/20 130/80  05/25/19 126/68   Repeat BMP Maintain current medication

## 2020-07-09 NOTE — Assessment & Plan Note (Signed)
ASCVD risk of 11.3%. Declined use of statin at this time Lipid Panel     Component Value Date/Time   CHOL 170 01/07/2020 1201   TRIG 103.0 01/07/2020 1201   HDL 43.10 01/07/2020 1201   CHOLHDL 4 01/07/2020 1201   VLDL 20.6 01/07/2020 1201   LDLCALC 106 (H) 01/07/2020 1201

## 2020-07-10 ENCOUNTER — Ambulatory Visit (INDEPENDENT_AMBULATORY_CARE_PROVIDER_SITE_OTHER): Payer: Medicare HMO

## 2020-07-10 VITALS — Ht 65.0 in | Wt 160.0 lb

## 2020-07-10 DIAGNOSIS — Z0000 Encounter for general adult medical examination without abnormal findings: Secondary | ICD-10-CM | POA: Diagnosis not present

## 2020-07-10 NOTE — Progress Notes (Signed)
Subjective:   Ebony Mayer is a 74 y.o. female who presents for Medicare Annual (Subsequent) preventive examination.  I connected with Ebony Mayer today by telephone and verified that I am speaking with the correct person using two identifiers. Location patient: home Location provider: work Persons participating in the virtual visit: patient, Engineer, civil (consulting).    I discussed the limitations, risks, security and privacy concerns of performing an evaluation and management service by telephone and the availability of in person appointments. I also discussed with the patient that there may be a patient responsible charge related to this service. The patient expressed understanding and verbally consented to this telephonic visit.    Interactive audio and video telecommunications were attempted between this provider and patient, however failed, due to patient having technical difficulties OR patient did not have access to video capability.  We continued and completed visit with audio only.  Some vital signs may be absent or patient reported.   Time Spent with patient on telephone encounter: 20 minutes  Review of Systems     Cardiac Risk Factors include: advanced age (>102men, >75 women);hypertension;dyslipidemia;sedentary lifestyle     Objective:    Today's Vitals   07/10/20 1417  Weight: 160 lb (72.6 kg)  Height: 5\' 5"  (1.651 m)   Body mass index is 26.63 kg/m.  Advanced Directives 07/10/2020 11/08/2018 09/01/2018 08/27/2018 05/25/2016 04/29/2016 04/27/2016  Does Patient Have a Medical Advance Directive? No No No No No No No  Would patient like information on creating a medical advance directive? No - Patient declined No - Patient declined No - Patient declined No - Patient declined No - patient declined information - -    Current Medications (verified) Outpatient Encounter Medications as of 07/10/2020  Medication Sig  . amLODipine (NORVASC) 5 MG tablet Take 1 tablet (5 mg total) by mouth at  bedtime.  07/12/2020 aspirin EC 81 MG tablet Take 81 mg by mouth daily. Swallow whole.  . Calcium Carbonate-Vitamin D 600-400 MG-UNIT tablet Take 1 tablet by mouth daily.  . diclofenac Sodium (VOLTAREN) 1 % GEL Apply 2 g topically in the morning, at noon, and at bedtime.  . gabapentin (NEURONTIN) 300 MG capsule Take 1 capsule (300 mg total) by mouth at bedtime.  . hydrochlorothiazide (HYDRODIURIL) 25 MG tablet Take 1 tablet (25 mg total) by mouth daily.  . potassium chloride SA (KLOR-CON) 20 MEQ tablet Take 1 tablet (20 mEq total) by mouth daily.   No facility-administered encounter medications on file as of 07/10/2020.    Allergies (verified) Patient has no known allergies.   History: Past Medical History:  Diagnosis Date  . Hypertension   . Mixed hyperlipidemia 10/19/2017  . Patellofemoral arthritis of left knee 07/08/2016   History reviewed. No pertinent surgical history. Family History  Problem Relation Age of Onset  . Diabetes Mother   . Heart attack Mother   . Cancer Sister   . Heart attack Brother    Social History   Socioeconomic History  . Marital status: Single    Spouse name: Not on file  . Number of children: Not on file  . Years of education: Not on file  . Highest education level: Not on file  Occupational History  . Not on file  Tobacco Use  . Smoking status: Never Smoker  . Smokeless tobacco: Never Used  Substance and Sexual Activity  . Alcohol use: No  . Drug use: No  . Sexual activity: Not Currently  Other Topics Concern  . Not on file  Social History Narrative  . Not on file   Social Determinants of Health   Financial Resource Strain: Low Risk   . Difficulty of Paying Living Expenses: Not hard at all  Food Insecurity: No Food Insecurity  . Worried About Programme researcher, broadcasting/film/video in the Last Year: Never true  . Ran Out of Food in the Last Year: Never true  Transportation Needs: No Transportation Needs  . Lack of Transportation (Medical): No  . Lack of  Transportation (Non-Medical): No  Physical Activity: Inactive  . Days of Exercise per Week: 0 days  . Minutes of Exercise per Session: 0 min  Stress: No Stress Concern Present  . Feeling of Stress : Not at all  Social Connections: Moderately Isolated  . Frequency of Communication with Friends and Family: More than three times a week  . Frequency of Social Gatherings with Friends and Family: Once a week  . Attends Religious Services: 1 to 4 times per year  . Active Member of Clubs or Organizations: No  . Attends Banker Meetings: Never  . Marital Status: Never married    Tobacco Counseling Counseling given: Not Answered   Clinical Intake:  Pre-visit preparation completed: Yes  Pain : No/denies pain     Nutritional Status: BMI 25 -29 Overweight Nutritional Risks: None Diabetes: No  How often do you need to have someone help you when you read instructions, pamphlets, or other written materials from your doctor or pharmacy?: 1 - Never What is the last grade level you completed in school?: 9th grade  Diabetic?No  Interpreter Needed?: No  Information entered by :: Thomasenia Sales LPN   Activities of Daily Living In your present state of health, do you have any difficulty performing the following activities: 07/10/2020  Hearing? N  Vision? N  Difficulty concentrating or making decisions? N  Walking or climbing stairs? N  Dressing or bathing? N  Doing errands, shopping? N  Preparing Food and eating ? N  Using the Toilet? N  In the past six months, have you accidently leaked urine? N  Do you have problems with loss of bowel control? N  Managing your Medications? N  Managing your Finances? N  Housekeeping or managing your Housekeeping? N  Some recent data might be hidden    Patient Care Team: Nche, Bonna Gains, NP as PCP - General (Internal Medicine) Gaspar Cola, [facility] as Pharmacist (Pharmacist)  Indicate any recent Medical Services you may  have received from other than [facility] providers in the past year (date may be approximate).     Assessment:   This is a routine wellness examination for Ebony Mayer.  Hearing/Vision screen  Hearing Screening   125Hz  250Hz  500Hz  1000Hz  2000Hz  3000Hz  4000Hz  6000Hz  8000Hz   Right ear:           Left ear:           Comments: No issues  Vision Screening Comments: Last eye exam-unsure-plans to make appt next week  Dietary issues and exercise activities discussed: Current Exercise Habits: The patient does not participate in regular exercise at present, Exercise limited by: None identified  Goals    . Chronic Care Management     CARE PLAN ENTRY  Current Barriers:  . Chronic Disease Management support, education, and care coordination needs related to Hypertension, mixed hyperlipidemia, arthritis  Clinical Goal(s): Over the next 180 days, patient will:  . Work with the care management team to address educational, disease management, and care coordination needs  .  Begin or continue self health monitoring activities as directed today  . Call provider office for new or worsened signs and symptoms  . Call care management team with questions or concerns . Maintain blood pressure less than 140/90 . Maintain LDL (bad cholesterol) less than 100   Interventions:  . Evaluation of current treatment plans and patient's adherence to plan as established by provider . Assessed patient understanding of disease states . Assessed patient's education and care coordination needs . Provided disease specific education to patient   . Verbal consent obtained for [facility] enhanced pharmacy services (medication synchronization, adherence packaging, delivery coordination). A medication sync plan was created to allow patient to get all medications delivered once every 30 to 90 days per patient preference. Patient understands they have freedom to choose pharmacy and clinical pharmacist will coordinate care between  all prescribers and [facility].   Patient Self Care Activities:  . Patient agrees to resume walking. Try to aim for 30 minutes of activity daily, or 150 minutes per week.  . Patient agrees to try to incorporate more vegetables into her diet, using the PLATE method  . Patient agrees to check her blood pressure 1-2 times per month and when symptomatic   Telephone follow up appointment with pharmacy team member scheduled for: 06/24/20 at 9:30 AM     . Increase physical activity      Depression Screen PHQ 2/9 Scores 07/10/2020 01/07/2020 11/08/2018 10/19/2017 05/25/2016  PHQ - 2 Score 0 0 0 0 0    Fall Risk Fall Risk  07/10/2020 01/07/2020 11/08/2018 10/19/2017  Falls in the past year? 0 0 0 No  Number falls in past yr: 0 - - -  Injury with Fall? 0 - - -  Follow up Falls prevention discussed - - -    Any stairs in or around the home? Yes  If so, are there any without handrails? Yes  Home free of loose throw rugs in walkways, pet beds, electrical cords, etc? Yes  Adequate lighting in your home to reduce risk of falls? Yes   ASSISTIVE DEVICES UTILIZED TO PREVENT FALLS:  Life alert? Yes  Use of a cane, walker or w/c? Yes  Grab bars in the bathroom? Yes  Shower chair or bench in shower? No  Elevated toilet seat or a handicapped toilet? No   TIMED UP AND GO:  Was the test performed? No . Phone visit   Cognitive Function:No cognitive impairment noted MMSE - Mini Mental State Exam 11/08/2018  Orientation to time 5  Orientation to Place 5  Registration 3  Attention/ Calculation 5  Attention/Calculation-comments pt does not do well with spelling or math  Recall 0  Language- name 2 objects 2  Language- repeat 1  Language- follow 3 step command 3  Language- read & follow direction 1  Write a sentence 0  Copy design 0  Total score 25        Immunizations Immunization History  Administered Date(s) Administered  . PFIZER SARS-COV-2 Vaccination 12/20/2019, 01/14/2020  .  Pneumococcal Conjugate-13 07/09/2020    TDAP status: Due, Education has been provided regarding the importance of this vaccine. Advised may receive this vaccine at local pharmacy or Health Dept. Aware to provide a copy of the vaccination record if obtained from local pharmacy or Health Dept. Verbalized acceptance and understanding.   Flu Vaccine status: Declined, Education has been provided regarding the importance of this vaccine but patient still declined. Advised may receive this vaccine at local pharmacy  or Health Dept. Aware to provide a copy of the vaccination record if obtained from local pharmacy or Health Dept. Verbalized acceptance and understanding.   Pneumococcal vaccine status: Up to date   Covid-19 vaccine status: Completed vaccines  Qualifies for Shingles Vaccine? Yes   Zostavax completed No   Shingrix Completed?: No.    Education has been provided regarding the importance of this vaccine. Patient has been advised to call insurance company to determine out of pocket expense if they have not yet received this vaccine. Advised may also receive vaccine at local pharmacy or Health Dept. Verbalized acceptance and understanding.  Screening Tests Health Maintenance  Topic Date Due  . TETANUS/TDAP  Never done  . INFLUENZA VACCINE  12/11/2020 (Originally 04/13/2020)  . Fecal DNA (Cologuard)  05/17/2021  . PNA vac Low Risk Adult (2 of 2 - PPSV23) 07/09/2021  . MAMMOGRAM  12/04/2021  . DEXA SCAN  Completed  . COVID-19 Vaccine  Completed  . Hepatitis C Screening  Completed    Health Maintenance  Health Maintenance Due  Topic Date Due  . TETANUS/TDAP  Never done    Colorectal cancer screening: Completed Cologuard 05/17/2018. Repeat every 3 years   Mammogram status: Completed Bilateral 12/05/19. Repeat every year   Bone Density status: Due-Declined at this time.  Lung Cancer Screening: (Low Dose CT Chest recommended if Age 10-80 years, 30 pack-year currently smoking OR have quit  w/in 15years.) does not qualify.     Additional Screening:  Hepatitis C Screening:  Completed 05/31/2016  Vision Screening: Recommended annual ophthalmology exams for early detection of glaucoma and other disorders of the eye. Is the patient up to date with their annual eye exam?  No  Who is the provider or what is the name of the office in which the patient attends annual eye exams? Unknown-Patient plans to make her own appt this week.   Dental Screening: Recommended annual dental exams for proper oral hygiene  Community Resource Referral / Chronic Care Management: CRR required this visit?  No   CCM required this visit?  No      Plan:     I have personally reviewed and noted the following in the patient's chart:   . Medical and social history . Use of alcohol, tobacco or illicit drugs  . Current medications and supplements . Functional ability and status . Nutritional status . Physical activity . Advanced directives . List of other physicians . Hospitalizations, surgeries, and ER visits in previous 12 months . Vitals . Screenings to include cognitive, depression, and falls . Referrals and appointments  In addition, I have reviewed and discussed with patient certain preventive protocols, quality metrics, and best practice recommendations. A written personalized care plan for preventive services as well as general preventive health recommendations were provided to patient.   Due to this being a telephonic visit, the after visit summary with patients personalized plan was offered to patient via mail or my-chart. per request, patient was mailed a copy of AVS.   Roanna Raider, LPN   [phone]  Nurse Health Advisor  Nurse Notes: None

## 2020-07-10 NOTE — Patient Instructions (Signed)
Ebony Mayer , Thank you for taking time to complete your Medicare Wellness Visit. I appreciate your ongoing commitment to your health goals. Please review the following plan we discussed and let me know if I can assist you in the future.   Screening recommendations/referrals: Colonoscopy: Completed Cologuard 05/17/2018- Due-05/17/2021 Mammogram: Completed 12/05/2019- Due 12/04/2020 Bone Density: Due-Declined at this time. Please call the office to schedule if you change your mind. Recommended yearly ophthalmology/optometry visit for glaucoma screening and checkup Recommended yearly dental visit for hygiene and checkup  Vaccinations: Influenza vaccine: Due- May obtain at our office or your local pharmacy. Pneumococcal vaccine: Up to Date- Pneumovax-23 due-07/09/2021 Tdap vaccine: Discuss with pharmacy Shingles vaccine: Discuss with pharmacy  Covid-19:Completed vaccines  Advanced directives: Declined information today.  Conditions/risks identified: See problem list  Next appointment: Follow up in one year for your annual wellness visit 07/14/2021 @ 2:15pm   Preventive Care 73 Years and Older, Female Preventive care refers to lifestyle choices and visits with your health care provider that can promote health and wellness. What does preventive care include?  A yearly physical exam. This is also called an annual well check.  Dental exams once or twice a year.  Routine eye exams. Ask your health care provider how often you should have your eyes checked.  Personal lifestyle choices, including:  Daily care of your teeth and gums.  Regular physical activity.  Eating a healthy diet.  Avoiding tobacco and drug use.  Limiting alcohol use.  Practicing safe sex.  Taking low-dose aspirin every day.  Taking vitamin and mineral supplements as recommended by your health care provider. What happens during an annual well check? The services and screenings done by your health care provider  during your annual well check will depend on your age, overall health, lifestyle risk factors, and family history of disease. Counseling  Your health care provider may ask you questions about your:  Alcohol use.  Tobacco use.  Drug use.  Emotional well-being.  Home and relationship well-being.  Sexual activity.  Eating habits.  History of falls.  Memory and ability to understand (cognition).  Work and work Astronomer.  Reproductive health. Screening  You may have the following tests or measurements:  Height, weight, and BMI.  Blood pressure.  Lipid and cholesterol levels. These may be checked every 5 years, or more frequently if you are over 60 years old.  Skin check.  Lung cancer screening. You may have this screening every year starting at age 73 if you have a 30-pack-year history of smoking and currently smoke or have quit within the past 15 years.  Fecal occult blood test (FOBT) of the stool. You may have this test every year starting at age 73  Flexible sigmoidoscopy or colonoscopy. You may have a sigmoidoscopy every 5 years or a colonoscopy every 10 years starting at age 73  Hepatitis C blood test.  Hepatitis B blood test.  Sexually transmitted disease (STD) testing.  Diabetes screening. This is done by checking your blood sugar (glucose) after you have not eaten for a while (fasting). You may have this done every 1-3 years.  Bone density scan. This is done to screen for osteoporosis. You may have this done starting at age 73  Mammogram. This may be done every 1-2 years. Talk to your health care provider about how often you should have regular mammograms. Talk with your health care provider about your test results, treatment options, and if necessary, the need for more tests. Vaccines  Your health care provider may recommend certain vaccines, such as:  Influenza vaccine. This is recommended every year.  Tetanus, diphtheria, and acellular pertussis  (Tdap, Td) vaccine. You may need a Td booster every 10 years.  Zoster vaccine. You may need this after age 73  Pneumococcal 13-valent conjugate (PCV13) vaccine. One dose is recommended after age 73  Pneumococcal polysaccharide (PPSV23) vaccine. One dose is recommended after age 73 Talk to your health care provider about which screenings and vaccines you need and how often you need them. This information is not intended to replace advice given to you by your health care provider. Make sure you discuss any questions you have with your health care provider. Document Released: 09/26/2015 Document Revised: 05/19/2016 Document Reviewed: 07/01/2015 Elsevier Interactive Patient Education  2017 [employer].  Fall Prevention in the Home Falls can cause injuries. They can happen to people of all ages. There are many things you can do to make your home safe and to help prevent falls. What can I do on the outside of my home?  Regularly fix the edges of walkways and driveways and fix any cracks.  Remove anything that might make you trip as you walk through a door, such as a raised step or threshold.  Trim any bushes or trees on the path to your home.  Use bright outdoor lighting.  Clear any walking paths of anything that might make someone trip, such as rocks or tools.  Regularly check to see if handrails are loose or broken. Make sure that both sides of any steps have handrails.  Any raised decks and porches should have guardrails on the edges.  Have any leaves, snow, or ice cleared regularly.  Use sand or salt on walking paths during winter.  Clean up any spills in your garage right away. This includes oil or grease spills. What can I do in the bathroom?  Use night lights.  Install grab bars by the toilet and in the tub and shower. Do not use towel bars as grab bars.  Use non-skid mats or decals in the tub or shower.  If you need to sit down in the shower, use a plastic, non-slip  stool.  Keep the floor dry. Clean up any water that spills on the floor as soon as it happens.  Remove soap buildup in the tub or shower regularly.  Attach bath mats securely with double-sided non-slip rug tape.  Do not have throw rugs and other things on the floor that can make you trip. What can I do in the bedroom?  Use night lights.  Make sure that you have a light by your bed that is easy to reach.  Do not use any sheets or blankets that are too big for your bed. They should not hang down onto the floor.  Have a firm chair that has side arms. You can use this for support while you get dressed.  Do not have throw rugs and other things on the floor that can make you trip. What can I do in the kitchen?  Clean up any spills right away.  Avoid walking on wet floors.  Keep items that you use a lot in easy-to-reach places.  If you need to reach something above you, use a strong step stool that has a grab bar.  Keep electrical cords out of the way.  Do not use floor polish or wax that makes floors slippery. If you must use wax, use non-skid floor wax.  Do  not have throw rugs and other things on the floor that can make you trip. What can I do with my stairs?  Do not leave any items on the stairs.  Make sure that there are handrails on both sides of the stairs and use them. Fix handrails that are broken or loose. Make sure that handrails are as long as the stairways.  Check any carpeting to make sure that it is firmly attached to the stairs. Fix any carpet that is loose or worn.  Avoid having throw rugs at the top or bottom of the stairs. If you do have throw rugs, attach them to the floor with carpet tape.  Make sure that you have a light switch at the top of the stairs and the bottom of the stairs. If you do not have them, ask someone to add them for you. What else can I do to help prevent falls?  Wear shoes that:  Do not have high heels.  Have rubber bottoms.  Are  comfortable and fit you well.  Are closed at the toe. Do not wear sandals.  If you use a stepladder:  Make sure that it is fully opened. Do not climb a closed stepladder.  Make sure that both sides of the stepladder are locked into place.  Ask someone to hold it for you, if possible.  Clearly mark and make sure that you can see:  Any grab bars or handrails.  First and last steps.  Where the edge of each step is.  Use tools that help you move around (mobility aids) if they are needed. These include:  Canes.  Walkers.  Scooters.  Crutches.  Turn on the lights when you go into a dark area. Replace any light bulbs as soon as they burn out.  Set up your furniture so you have a clear path. Avoid moving your furniture around.  If any of your floors are uneven, fix them.  If there are any pets around you, be aware of where they are.  Review your medicines with your doctor. Some medicines can make you feel dizzy. This can increase your chance of falling. Ask your doctor what other things that you can do to help prevent falls. This information is not intended to replace advice given to you by your health care provider. Make sure you discuss any questions you have with your health care provider. Document Released: 06/26/2009 Document Revised: 02/05/2016 Document Reviewed: 10/04/2014 Elsevier Interactive Patient Education  2017 [employer].

## 2020-07-13 ENCOUNTER — Other Ambulatory Visit: Payer: Self-pay | Admitting: Nurse Practitioner

## 2020-07-13 DIAGNOSIS — I10 Essential (primary) hypertension: Secondary | ICD-10-CM

## 2020-07-13 DIAGNOSIS — E876 Hypokalemia: Secondary | ICD-10-CM

## 2020-07-13 DIAGNOSIS — T502X5A Adverse effect of carbonic-anhydrase inhibitors, benzothiadiazides and other diuretics, initial encounter: Secondary | ICD-10-CM

## 2020-07-14 NOTE — Telephone Encounter (Signed)
Last OV 07/09/20 Last fill for both medications 01/09/20  #90/1

## 2020-07-17 ENCOUNTER — Telehealth: Payer: Self-pay

## 2020-07-17 NOTE — Progress Notes (Signed)
Left a voice message  to confirmed patient telephone appointment on 07/18/2020  for CCM at 11:00 am with Angelena Sole the Clinical pharmacist.    Everlean Cherry Clinical Pharmacist Assistant [phone]

## 2020-07-18 ENCOUNTER — Ambulatory Visit: Payer: Medicare HMO

## 2020-07-18 DIAGNOSIS — E782 Mixed hyperlipidemia: Secondary | ICD-10-CM

## 2020-07-18 DIAGNOSIS — I10 Essential (primary) hypertension: Secondary | ICD-10-CM

## 2020-07-18 NOTE — Chronic Care Management (AMB) (Signed)
Chronic Care Management Pharmacy  Name: Ebony Mayer  MRN: [id number] DOB: 1946/03/31  Chief Complaint/ HPI  Ebony Mayer,  74 y.o. , female presents for their Follow-Up CCM visit with the clinical pharmacist via telephone.  PCP : Anne Ng, NP  Their chronic conditions include: Hypertension, mixed hyperlipidemia, arthritis, Osteopenia   Office Visits: 07/09/20: Patient presented to [facility] for follow-up. No medication changes made.  05/25/19: Patient presented to [facility] for HTN follow-up. BP in clinic 126/68. Potassium 3.4, all other labs stable. Patient started on potassium chloride 20 mEq daily.   Consult Visits: None in past 6 months.   Medications: Outpatient Encounter Medications as of 07/18/2020  Medication Sig  . amLODipine (NORVASC) 5 MG tablet Take 1 tablet (5 mg total) by mouth at bedtime.  Marland Kitchen aspirin EC 81 MG tablet Take 81 mg by mouth daily. Swallow whole.  . Calcium Carbonate-Vitamin D 600-400 MG-UNIT tablet Take 1 tablet by mouth daily.  . diclofenac Sodium (VOLTAREN) 1 % GEL Apply 2 g topically in the morning, at noon, and at bedtime.  . gabapentin (NEURONTIN) 300 MG capsule Take 1 capsule (300 mg total) by mouth at bedtime.  . hydrochlorothiazide (HYDRODIURIL) 25 MG tablet TAKE ONE TABLET BY MOUTH EVERY MORNING  . potassium chloride SA (KLOR-CON) 20 MEQ tablet TAKE ONE TABLET BY MOUTH EVERY MORNING   No facility-administered encounter medications on file as of 07/18/2020.   Current Diagnosis/Assessment:  SDOH Interventions     Most Recent Value  SDOH Interventions  Financial Strain Interventions Intervention Not Indicated  Transportation Interventions Intervention Not Indicated     Goals Addressed            This Visit's Progress   . Chronic Care Management       CARE PLAN ENTRY (see longitudinal plan of care for additional care plan information)  Current Barriers:  . Chronic Disease Management support, education, and  care coordination needs related to Hypertension, Hyperlipidemia, Osteopenia, and Osteoarthritis   Hypertension BP Readings from Last 3 Encounters:  07/09/20 128/70  01/07/20 130/80  05/25/19 126/68   . Pharmacist Clinical Goal(s): o Over the next 90 days, patient will work with PharmD and providers to maintain BP goal <130/80 . Current regimen:  o Amlodipine 5 mg daily  o Hydrochlorothiazide 25 mg daily . Interventions: o Discussed low salt diet and exercising as tolerated extensively o Will initiate blood pressure monitoring plan  . Patient self care activities - Over the next 90 days, patient will: o Check Blood Pressure Weekly, document, and provide at future appointments o Ensure daily salt intake < 2300 mg/day  Hyperlipidemia Lab Results  Component Value Date/Time   LDLCALC 106 (H) 01/07/2020 12:01 PM   . Pharmacist Clinical Goal(s): o Over the next 90 days, patient will work with PharmD and providers to achieve LDL goal < 100 . Current regimen:  o None . Interventions: o Discussed low cholesterol diet and exercising as tolerated extensively o Will initiate cholesterol monitoring plan  Osteopenia . Pharmacist Clinical Goal(s) o Over the next 90 days, patient will work with PharmD and providers to maintain bone health and prevent fractures. . Current regimen:  o Calcium Carbonate + Vitamin D3 600 mg-400 units daily . Interventions: o Recommend [phone] units of vitamin D daily.  o Recommend 1200 mg of calcium daily from dietary and supplemental sources.  Medication management . Pharmacist Clinical Goal(s): o Over the next 90 days, patient will work with PharmD and providers to maintain  optimal medication adherence . Current pharmacy: [facility] . Interventions o Comprehensive medication review performed. o Utilize [facility] for medication synchronization, packaging and delivery . Patient self care activities - Over the next 90 days, patient  will: o Take medications as prescribed o Report any questions or concerns to PharmD and/or provider(s)      Hypertension   Office blood pressures are  BP Readings from Last 3 Encounters:  07/09/20 128/70  01/07/20 130/80  05/25/19 126/68   CMP Latest Ref Rng & Units 07/09/2020 01/07/2020 05/25/2019  Glucose 70 - 99 mg/dL 546(T) 035(W) 656(C)  BUN 6 - 23 mg/dL 13 12 13   Creatinine 0.40 - 1.20 mg/dL 1.27 5.17  Sodium 135 - 145 mEq/L 139 138 138  Potassium 3.5 - 5.1 mEq/L 3.8 3.8 3.4(L)  Chloride 96 - 112 mEq/L 100 101 100  CO2 19 - 32 mEq/L 32 31 31  Calcium 8.4 - 10.5 mg/dL 9.5 9.3 9.6  Total Protein 6.0 - 8.3 g/dL - 6.8 -  Total Bilirubin 0.2 - 1.2 mg/dL - 0.6 -  Alkaline Phos 39 - 117 U/L - 59 -  AST 0 - 37 U/L - 14 -  ALT 0 - 35 U/L - 9 -     Patient has failed these meds in the past: n/a Patient is currently controlled on the following medications:   Amlodipine 5 mg daily   Hydrochlorothiazide 25 mg daily   Patient checks BP at home infrequently. Has blood pressure monitor   Patient home BP readings are ranging: n/a.  We discussed diet and exercise extensively. Patient feels like she eats too much junk food and does not know what kind of foods are healthy. 24 Hour recall: Breakfast: Oatmeal  Snack: Banana  Lunch: None.  Dinner: Cabbage with chicken and corn.  Drinks mainly water.   Patient previously was spending 30-35 minutes walking around most days, but has not been as active recently due to the cold. States she wants to get back into it now. She has stayed busy watching her grandchildren.    Plan  Continue current medications   Hyperlipidemia   Lipid Panel     Component Value Date/Time   CHOL 170 01/07/2020 1201   TRIG 103.0 01/07/2020 1201   HDL 43.10 01/07/2020 1201   CHOLHDL 4 01/07/2020 1201   VLDL 20.6 01/07/2020 1201   LDLCALC 106 (H) 01/07/2020 1201     The 10-year ASCVD risk score 01/09/2020 DC Jr., et al., 2013) is: 11.3%   Values used  to calculate the score:     Age: 82 years     Sex: Female     Is Non-Hispanic African American: Yes     Diabetic: No     Tobacco smoker: No     Systolic Blood Pressure: 128 mmHg     Is BP treated: Yes     HDL Cholesterol: 43.1 mg/dL     Total Cholesterol: 170 mg/dL   Patient has failed these meds in past: n/a Patient is currently controlled on the following medications: none  We discussed:  diet and exercise extensively Avoids greasy foods.   Plan  Continue control with diet and exercise   Osteopenia / Osteoporosis   Last DEXA Scan: 07/16/16   T-Score femoral neck: -1.9 (R), -1.7 (L)  T-Score total hip: n/a  T-Score lumbar spine: +0.4  T-Score forearm radius: n/a  10-year probability of major osteoporotic fracture: 5.1%  10-year probability of hip fracture: 0.9%  No results  found for: VD25OH   Patient is not a candidate for pharmacologic treatment  Patient has failed these meds in past: n/a Patient is currently controlled on the following medications:  . Calcium Carbonate + Vit D3 600 mg-400 units daily  We discussed:  Recommend [phone] units of vitamin D daily. Recommend 1200 mg of calcium daily from dietary and supplemental sources.  Plan  Recommend Repeat DEXA scan. Patient states she is working to get this scheduled. Recommend increasing calcium intake to twice daily.   Chronic Pain/ Arthritis   Patient has failed these meds in past: n/a Patient is currently controlled on the following medications:   Diclofenac 1% gel   Gabapentin 300 mg daily at bedtime    We discussed: Patient reports leg pain mostly well controlled. She doesn't need to use diclofenac gel every day.  Plan  Continue current medications   Misc/OTC   Potassium Chloride SA 20 mEq daily  We discussed:   Plan  Continue current medications  Vaccines   Reviewed and discussed patient's vaccination history.    Immunization History  Administered Date(s) Administered  . PFIZER  SARS-COV-2 Vaccination 12/20/2019, 01/14/2020  . Pneumococcal Conjugate-13 07/09/2020   Recommend patient get annual flu and Covid-19 booster vaccines.   Medication Management   Pt uses [facility] for all medications.   BP Readings from Last 3 Encounters:  07/09/20 128/70  01/07/20 130/80  05/25/19 126/68    Lab Results  Component Value Date   HGBA1C 5.2 01/07/2020     Patient obtains medications through Adherence Packaging  30 Days   Last adherence delivery included:   Gabapentin 300 mg daily -Bedtime Amlodipine 5 mg daily -Bedtime Hydrochlorothiazide 25 mg daily -Breakfast Potassium Cl ER 20 Meq daily -Breakfast  Aspirin 81 mg daily with breakfast -Vial  Calcium Carbonate + D 600 mg daily with breakfast-Vial Voltaren 1% gel PRN   Patient is due for next adherence delivery on: 07/24/20. Called patient and reviewed medications and coordinated delivery.  This delivery to include:  Gabapentin 300 mg daily - Bedtime  Amlodipine 5 mg daily - Bedtime  Hydrochlorothiazide 25 mg daily - Breakfast  Potassium Cl ER 20 Meq daily - Breakfast   Aspirin 81 mg daily- Vial   Calcium Carbonate + D 600 mg twice daily- Vial  Patient declined the following medications (meds) due to (reason)  Voltaren Gel (PRN Use- Adequate supply)  Patient needs refills for None ID'd.  Confirmed delivery date of 07/24/20, advised patient that pharmacy will contact them the morning of delivery.  Follow up: 6 month phone visit  Garey Ham Clinical Pharmacist [facility]  [phone]

## 2020-08-14 ENCOUNTER — Telehealth: Payer: Self-pay

## 2020-08-14 NOTE — Progress Notes (Signed)
006   Chronic Care Management Pharmacy Assistant   Name: Ebony Mayer  MRN: [id number] DOB: [birth year]  Reason for Encounter: Medication Review  PCP : Anne Ng, NP  Allergies:  No Known Allergies  Medications: Outpatient Encounter Medications as of 08/14/2020  Medication Sig  . amLODipine (NORVASC) 5 MG tablet Take 1 tablet (5 mg total) by mouth at bedtime.  Marland Kitchen aspirin EC 81 MG tablet Take 81 mg by mouth daily. Swallow whole.  . Calcium Carbonate-Vitamin D 600-400 MG-UNIT tablet Take 1 tablet by mouth daily.  . diclofenac Sodium (VOLTAREN) 1 % GEL Apply 2 g topically in the morning, at noon, and at bedtime.  . gabapentin (NEURONTIN) 300 MG capsule Take 1 capsule (300 mg total) by mouth at bedtime.  . hydrochlorothiazide (HYDRODIURIL) 25 MG tablet TAKE ONE TABLET BY MOUTH EVERY MORNING  . potassium chloride SA (KLOR-CON) 20 MEQ tablet TAKE ONE TABLET BY MOUTH EVERY MORNING   No facility-administered encounter medications on file as of 08/14/2020.    Current Diagnosis: Patient Active Problem List   Diagnosis Date Noted  . Mixed hyperlipidemia 10/19/2017  . Hyperglycemia 10/19/2017  . Patellofemoral arthritis of left knee 07/08/2016  . It band syndrome, left 06/15/2016  . Essential hypertension, benign 05/31/2016  . Leg pain, lateral 05/31/2016  . Varicose veins of left lower extremity with pain 05/31/2016  . Onychomycosis 10/10/2013  . Porokeratosis 10/10/2013    Follow-Up:  Pharmacist Review   Reviewed chart for medication changes ahead of medication coordination call.  No OVs, Consults, or hospital visits since last care coordination call/Pharmacist visit. (If appropriate, list visit date, provider name)  No medication changes indicated OR if recent visit, treatment plan here.  BP Readings from Last 3 Encounters:  07/09/20 128/70  01/07/20 130/80  05/25/19 126/68    Lab Results  Component Value Date   HGBA1C 5.2 01/07/2020     Patient obtains  medications through Adherence Packaging  30 Days   Last adherence delivery included: (medication name and frequency)   Gabapentin 300 mg daily - Bedtime             Amlodipine 5 mg daily - Bedtime             Hydrochlorothiazide 25 mg daily - Breakfast             Potassium Cl ER 20 Meq daily - Breakfast              Aspirin 81 mg daily- Vial              Calcium Carbonate + D 600 mg twice daily- Vial Patient declined (meds) last month due to PRN use/additional supply on hand.   Voltaren Gel (PRN Use- Adequate supply)  Patient is due for next adherence delivery on: 08/22/2020. Called patient and reviewed medications and coordinated delivery.  This delivery to include:  Gabapentin 300 mg daily - Bedtime             Amlodipine 5 mg daily - Bedtime             Hydrochlorothiazide 25 mg daily - Breakfast             Potassium Cl ER 20 Meq daily - Breakfast              Aspirin 81 mg daily- Vial              Calcium Carbonate + D 600 mg twice daily- Vial  Patient declined the  following medications (meds) due to (reason)   Voltaren Gel (PRN Use- Adequate supply)  Patient needs refills for None ID .  Confirmed delivery date of 08/22/2020, advised patient that pharmacy will contact them the morning of delivery.  Everlean Cherry Clinical Pharmacist Assistant [phone]

## 2020-09-11 ENCOUNTER — Telehealth: Payer: Self-pay

## 2020-09-11 NOTE — Progress Notes (Signed)
    Chronic Care Management Pharmacy Assistant   Name: Ebony Mayer  MRN: [id number] DOB: [birth year]  Reason for Encounter: Medication Review   PCP : Anne Ng, NP  Allergies:  No Known Allergies  Medications: Outpatient Encounter Medications as of 09/11/2020  Medication Sig  . amLODipine (NORVASC) 5 MG tablet Take 1 tablet (5 mg total) by mouth at bedtime.  Marland Kitchen aspirin EC 81 MG tablet Take 81 mg by mouth daily. Swallow whole.  . Calcium Carbonate-Vitamin D 600-400 MG-UNIT tablet Take 1 tablet by mouth daily.  . diclofenac Sodium (VOLTAREN) 1 % GEL Apply 2 g topically in the morning, at noon, and at bedtime.  . gabapentin (NEURONTIN) 300 MG capsule Take 1 capsule (300 mg total) by mouth at bedtime.  . hydrochlorothiazide (HYDRODIURIL) 25 MG tablet TAKE ONE TABLET BY MOUTH EVERY MORNING  . potassium chloride SA (KLOR-CON) 20 MEQ tablet TAKE ONE TABLET BY MOUTH EVERY MORNING   No facility-administered encounter medications on file as of 09/11/2020.    Current Diagnosis: Patient Active Problem List   Diagnosis Date Noted  . Mixed hyperlipidemia 10/19/2017  . Hyperglycemia 10/19/2017  . Patellofemoral arthritis of left knee 07/08/2016  . It band syndrome, left 06/15/2016  . Essential hypertension, benign 05/31/2016  . Leg pain, lateral 05/31/2016  . Varicose veins of left lower extremity with pain 05/31/2016  . Onychomycosis 10/10/2013  . Porokeratosis 10/10/2013    Goals Addressed   None    Performed cost analysis for patient, estimated yearly medication cost of $78.96.  Follow-Up:  Pharmacist Review   Everlean Cherry Clinical Pharmacist Assistant [phone]

## 2020-09-15 ENCOUNTER — Other Ambulatory Visit: Payer: Self-pay | Admitting: Nurse Practitioner

## 2020-09-15 ENCOUNTER — Telehealth: Payer: Self-pay

## 2020-09-15 MED ORDER — DICLOFENAC SODIUM 1 % EX GEL: 2.0000 g | Freq: Three times a day (TID) | CUTANEOUS | 0 refills | Status: DC

## 2020-09-15 NOTE — Telephone Encounter (Signed)
[facility] is calling to get a refill on patients Voltaren. If approved, please call them at [phone].

## 2020-09-15 NOTE — Progress Notes (Addendum)
Chronic Care Management Pharmacy Assistant   Name: Ebony Mayer  MRN: [id number] DOB: Dec 15, 1945  Reason for Encounter: Medication Review   PCP : Anne Ng, NP  Allergies:  No Known Allergies  Medications: Outpatient Encounter Medications as of 09/15/2020  Medication Sig   amLODipine (NORVASC) 5 MG tablet Take 1 tablet (5 mg total) by mouth at bedtime.   aspirin EC 81 MG tablet Take 81 mg by mouth daily. Swallow whole.   Calcium Carbonate-Vitamin D 600-400 MG-UNIT tablet Take 1 tablet by mouth daily.   diclofenac Sodium (VOLTAREN) 1 % GEL Apply 2 g topically in the morning, at noon, and at bedtime.   gabapentin (NEURONTIN) 300 MG capsule Take 1 capsule (300 mg total) by mouth at bedtime.   hydrochlorothiazide (HYDRODIURIL) 25 MG tablet TAKE ONE TABLET BY MOUTH EVERY MORNING   potassium chloride SA (KLOR-CON) 20 MEQ tablet TAKE ONE TABLET BY MOUTH EVERY MORNING   No facility-administered encounter medications on file as of 09/15/2020.    Current Diagnosis: Patient Active Problem List   Diagnosis Date Noted   Mixed hyperlipidemia 10/19/2017   Hyperglycemia 10/19/2017   Patellofemoral arthritis of left knee 07/08/2016   It band syndrome, left 06/15/2016   Essential hypertension, benign 05/31/2016   Leg pain, lateral 05/31/2016   Varicose veins of left lower extremity with pain 05/31/2016   Onychomycosis 10/10/2013   Porokeratosis 10/10/2013    Goals Addressed   None     Reviewed chart for medication changes ahead of medication coordination call.  No OVs, Consults, or hospital visits since last care coordination call/Pharmacist   No medication changes indicated  BP Readings from Last 3 Encounters:  07/09/20 128/70  01/07/20 130/80  05/25/19 126/68    Lab Results  Component Value Date   HGBA1C 5.2 01/07/2020     Patient obtains medications through Adherence Packaging  30 Days   Last adherence delivery included:  Gabapentin 300 mg daily -  Bedtime             Amlodipine 5 mg daily - Bedtime             Hydrochlorothiazide 25 mg daily - Breakfast             Potassium Cl ER 20 Meq daily - Breakfast              Aspirin 81 mg daily- Vial              Calcium Carbonate + D 600 mg twice daily- Vial  Patient declined medication last month.   Voltaren Gel (PRN Use- Adequate supply)  Patient is due for next adherence delivery on: 09/22/2020. Called patient and reviewed medications and coordinated delivery.  This delivery to include:  Gabapentin 300 mg daily - Bedtime             Amlodipine 5 mg daily - Bedtime             Hydrochlorothiazide 25 mg daily - Breakfast             Potassium Cl ER 20 Meq daily - Breakfast              Aspirin 81 mg daily- Vial              Calcium Carbonate + D 600 mg twice daily- Vial  Voltaren Gel PRN Use  Patient will not  need a short fill of medication prior to adherence delivery. Patient will not need a  acute fill of  medication prior to adherence delivery.  Patient declined the following medications: None ID  Patient needs refills for Voltaren 1% Gel , reached out to PCP on 09/15/2020 for refill..  Confirmed delivery date of 09/22/2020, advised patient that pharmacy will contact them the morning of delivery.  Blood pressure reading: Patient states she does not check her blood pressure at home because "my blood pressure is normal".  Follow-Up:  Pharmacist Review   Everlean Cherry Clinical Pharmacist Assistant [phone]    8 minutes spent in review, coordination, and documentation. Garey Ham Clinical Pharmacist [facility]  [phone]

## 2020-09-15 NOTE — Telephone Encounter (Signed)
Pt confirmed she does need refill on Voltaren gel and would like it sent to [facility].

## 2020-10-10 ENCOUNTER — Telehealth: Payer: Self-pay

## 2020-10-10 NOTE — Progress Notes (Signed)
    Chronic Care Management Pharmacy Assistant   Name: Ebony Mayer  MRN: [id number] DOB: 1946-06-27  Reason for Encounter: Medication Review  PCP : Anne Ng, NP  Allergies:  No Known Allergies  Medications: Outpatient Encounter Medications as of 10/10/2020  Medication Sig  . amLODipine (NORVASC) 5 MG tablet Take 1 tablet (5 mg total) by mouth at bedtime.  Marland Kitchen aspirin EC 81 MG tablet Take 81 mg by mouth daily. Swallow whole.  . Calcium Carbonate-Vitamin D 600-400 MG-UNIT tablet Take 1 tablet by mouth daily.  . diclofenac Sodium (VOLTAREN) 1 % GEL Apply 2 g topically in the morning, at noon, and at bedtime.  . gabapentin (NEURONTIN) 300 MG capsule Take 1 capsule (300 mg total) by mouth at bedtime.  . hydrochlorothiazide (HYDRODIURIL) 25 MG tablet TAKE ONE TABLET BY MOUTH EVERY MORNING  . potassium chloride SA (KLOR-CON) 20 MEQ tablet TAKE ONE TABLET BY MOUTH EVERY MORNING   No facility-administered encounter medications on file as of 10/10/2020.    Current Diagnosis: Patient Active Problem List   Diagnosis Date Noted  . Mixed hyperlipidemia 10/19/2017  . Hyperglycemia 10/19/2017  . Patellofemoral arthritis of left knee 07/08/2016  . It band syndrome, left 06/15/2016  . Essential hypertension, benign 05/31/2016  . Leg pain, lateral 05/31/2016  . Varicose veins of left lower extremity with pain 05/31/2016  . Onychomycosis 10/10/2013  . Porokeratosis 10/10/2013    Goals Addressed   None    Performed cost analysis for patient, estimated yearly medication cost of $78.96.  Follow-Up:  Pharmacist Review   Everlean Cherry Clinical Pharmacist Assistant [phone]

## 2020-10-14 ENCOUNTER — Telehealth: Payer: Self-pay

## 2020-10-14 NOTE — Progress Notes (Signed)
    Chronic Care Management Pharmacy Assistant   Name: Ebony Mayer  MRN: [id number] DOB: October 07, 1945  Reason for Encounter: Medication Review  Patient Questions:  1.  Have you seen any other providers since your last visit? No  2.  Any changes in your medicines or health? No    PCP : Anne Ng, NP  Allergies:  No Known Allergies  Medications: Outpatient Encounter Medications as of 10/14/2020  Medication Sig  . amLODipine (NORVASC) 5 MG tablet Take 1 tablet (5 mg total) by mouth at bedtime.  Marland Kitchen aspirin EC 81 MG tablet Take 81 mg by mouth daily. Swallow whole.  . Calcium Carbonate-Vitamin D 600-400 MG-UNIT tablet Take 1 tablet by mouth daily.  . diclofenac Sodium (VOLTAREN) 1 % GEL Apply 2 g topically in the morning, at noon, and at bedtime.  . gabapentin (NEURONTIN) 300 MG capsule Take 1 capsule (300 mg total) by mouth at bedtime.  . hydrochlorothiazide (HYDRODIURIL) 25 MG tablet TAKE ONE TABLET BY MOUTH EVERY MORNING  . potassium chloride SA (KLOR-CON) 20 MEQ tablet TAKE ONE TABLET BY MOUTH EVERY MORNING   No facility-administered encounter medications on file as of 10/14/2020.    Current Diagnosis: Patient Active Problem List   Diagnosis Date Noted  . Mixed hyperlipidemia 10/19/2017  . Hyperglycemia 10/19/2017  . Patellofemoral arthritis of left knee 07/08/2016  . It band syndrome, left 06/15/2016  . Essential hypertension, benign 05/31/2016  . Leg pain, lateral 05/31/2016  . Varicose veins of left lower extremity with pain 05/31/2016  . Onychomycosis 10/10/2013  . Porokeratosis 10/10/2013    Goals Addressed   None    Reviewed chart for medication changes ahead of medication coordination call.  No OVs, Consults, or hospital visits since last care coordination call/Pharmacist visit. No medication changes indicated.  BP Readings from Last 3 Encounters:  07/09/20 128/70  01/07/20 130/80  05/25/19 126/68    Lab Results  Component Value Date   HGBA1C 5.2  01/07/2020     Patient obtains medications through Adherence Packaging  30 Days   Last adherence delivery included:   Gabapentin 300 mg daily -Bedtime Amlodipine 5 mg daily -Bedtime Hydrochlorothiazide 25 mg daily -Breakfast Potassium Cl ER 20 Meq daily -Breakfast  Aspirin 81 mg daily-Vial  Calcium Carbonate + D 600 mg twice daily-Vial             Voltaren Gel PRN Use  Patient declined medication last month:  None ID  Patient is due for next adherence delivery on: 10/22/2020. Called patient and reviewed medications and coordinated delivery.  This delivery to include:  Gabapentin 300 mg daily -Bedtime Amlodipine 5 mg daily -Bedtime Hydrochlorothiazide 25 mg daily -Breakfast Potassium Cl ER 20 Meq daily -Breakfast  Aspirin 81 mg daily-Vial  Calcium Carbonate + D 600 mg twice daily-Vial  Patient will not  need a short fill of medication, prior to adherence delivery.  Patient will not need a acute fill for medication, prior to adherence delivery.  Patient declined the following medications:   Voltaren Gel PRN Use   Patient needs refills for None ID.  Confirmed delivery date of 10/22/2020, advised patient that pharmacy will contact them the morning of delivery.  Blood pressure reading: Patient states she does not check her blood pressure at home because "my blood pressure is normal".  Follow-Up:  Pharmacist Review   Everlean Cherry Clinical Pharmacist Assistant [phone]

## 2020-10-30 ENCOUNTER — Other Ambulatory Visit: Payer: Self-pay | Admitting: Nurse Practitioner

## 2020-10-30 DIAGNOSIS — Z1231 Encounter for screening mammogram for malignant neoplasm of breast: Secondary | ICD-10-CM

## 2020-11-13 ENCOUNTER — Telehealth: Payer: Self-pay

## 2020-11-13 NOTE — Progress Notes (Signed)
    Chronic Care Management Pharmacy Assistant   Name: Ebony Mayer  MRN: [id number] DOB: 05/25/1946  Reason for Encounter: Medication Review  Patient Questions:  1.  Have you seen any other providers since your last visit? No  2.  Any changes in your medicines or health? No   PCP : Anne Ng, NP  Allergies:  No Known Allergies  Medications: Outpatient Encounter Medications as of 11/13/2020  Medication Sig  . amLODipine (NORVASC) 5 MG tablet Take 1 tablet (5 mg total) by mouth at bedtime.  Marland Kitchen aspirin EC 81 MG tablet Take 81 mg by mouth daily. Swallow whole.  . Calcium Carbonate-Vitamin D 600-400 MG-UNIT tablet Take 1 tablet by mouth daily.  . diclofenac Sodium (VOLTAREN) 1 % GEL Apply 2 g topically in the morning, at noon, and at bedtime.  . gabapentin (NEURONTIN) 300 MG capsule Take 1 capsule (300 mg total) by mouth at bedtime.  . hydrochlorothiazide (HYDRODIURIL) 25 MG tablet TAKE ONE TABLET BY MOUTH EVERY MORNING  . potassium chloride SA (KLOR-CON) 20 MEQ tablet TAKE ONE TABLET BY MOUTH EVERY MORNING   No facility-administered encounter medications on file as of 11/13/2020.    Current Diagnosis: Patient Active Problem List   Diagnosis Date Noted  . Mixed hyperlipidemia 10/19/2017  . Hyperglycemia 10/19/2017  . Patellofemoral arthritis of left knee 07/08/2016  . It band syndrome, left 06/15/2016  . Essential hypertension, benign 05/31/2016  . Leg pain, lateral 05/31/2016  . Varicose veins of left lower extremity with pain 05/31/2016  . Onychomycosis 10/10/2013  . Porokeratosis 10/10/2013    Goals Addressed   None    Reviewed chart for medication changes ahead of medication coordination call.  No OVs, Consults, or hospital visits since last care coordination call/Pharmacist visit.   No medication changes indicated.  BP Readings from Last 3 Encounters:  07/09/20 128/70  01/07/20 130/80  05/25/19 126/68    Lab Results  Component Value Date   HGBA1C  5.2 01/07/2020     Patient obtains medications through Adherence Packaging  30 Days   Last adherence delivery included:    Gabapentin 300 mg daily -Bedtime Amlodipine 5 mg daily -Bedtime Hydrochlorothiazide 25 mg daily -Breakfast Potassium Cl ER 20 Meq daily -Breakfast  Aspirin 81 mg daily-Vial  Calcium Carbonate + D 600 mg twice daily-Vial  Patient declined medication last month due:   Voltaren Gel PRN Use  Patient is due for next adherence delivery on: 11/21/2020. Called patient and reviewed medications and coordinated delivery.   This delivery to include:  Gabapentin 300 mg daily -Bedtime Amlodipine 5 mg daily -Bedtime Hydrochlorothiazide 25 mg daily -Breakfast Potassium Cl ER 20 Meq daily -Breakfast  Aspirin 81 mg daily-Vial  Calcium Carbonate + D 600 mg twice daily-Vial  Patient will not  need a short fill of medications, prior to adherence delivery.  Patient will not need a acute fill of medications, prior to adherence delivery.  Patient declined the following medications:   Voltaren Gel PRN Use  Patient needs refills for None ID .  Confirmed delivery date of 11/21/2020, advised patient that pharmacy will contact them the morning of delivery.  Blood pressure readings: Patient states she is unsure where her monitor is , but when she fines it she will begin to check her blood pressure.  Follow-Up:  Pharmacist Review   Everlean Cherry Clinical Pharmacist Assistant [phone]

## 2020-12-10 ENCOUNTER — Other Ambulatory Visit: Payer: Self-pay | Admitting: Nurse Practitioner

## 2020-12-10 DIAGNOSIS — M7632 Iliotibial band syndrome, left leg: Secondary | ICD-10-CM

## 2020-12-10 DIAGNOSIS — M79605 Pain in left leg: Secondary | ICD-10-CM

## 2020-12-12 ENCOUNTER — Telehealth: Payer: Self-pay

## 2020-12-12 NOTE — Progress Notes (Signed)
    Chronic Care Management Pharmacy Assistant   Name: Ebony Mayer  MRN: [id number] DOB: 07/16/1946  Reason for Encounter: Medication Review/Medication Coordination Call.   Recent office visits:  None ID  Recent consult visits:  None ID  Hospital visits:  None in previous 6 months  Medications: Outpatient Encounter Medications as of 12/12/2020  Medication Sig  . amLODipine (NORVASC) 5 MG tablet Take 1 tablet (5 mg total) by mouth at bedtime.  Marland Kitchen aspirin EC 81 MG tablet Take 81 mg by mouth daily. Swallow whole.  . Calcium Carbonate-Vitamin D 600-400 MG-UNIT tablet Take 1 tablet by mouth daily.  . diclofenac Sodium (VOLTAREN) 1 % GEL Apply 2 g topically in the morning, at noon, and at bedtime.  . gabapentin (NEURONTIN) 300 MG capsule TAKE ONE CAPSULE BY MOUTH EVERYDAY AT BEDTIME  . hydrochlorothiazide (HYDRODIURIL) 25 MG tablet TAKE ONE TABLET BY MOUTH EVERY MORNING  . potassium chloride SA (KLOR-CON) 20 MEQ tablet TAKE ONE TABLET BY MOUTH EVERY MORNING   No facility-administered encounter medications on file as of 12/12/2020.    Star Rating Drugs:None ID   Reviewed chart for medication changes ahead of medication coordination call.  BP Readings from Last 3 Encounters:  07/09/20 128/70  01/07/20 130/80  05/25/19 126/68    Lab Results  Component Value Date   HGBA1C 5.2 01/07/2020     Patient obtains medications through Adherence Packaging  30 Days   Last adherence delivery included:   Gabapentin 300 mg daily -Bedtime Amlodipine 5 mg daily -Bedtime Hydrochlorothiazide 25 mg daily -Breakfast Potassium Cl ER 20 Meq daily -Breakfast  Aspirin 81 mg daily-Vial  Calcium Carbonate + D 600 mg twice daily-Vial  Patient declined medications last month:  Voltaren Gel PRN Use Patient is due for next adherence delivery on: 12/21/2020. Called patient and reviewed medications and coordinated delivery.  This  delivery to include:  Gabapentin 300 mg daily -Bedtime Amlodipine 5 mg daily -Bedtime Hydrochlorothiazide 25 mg daily -Breakfast Potassium Cl ER 20 Meq daily -Breakfast  Aspirin 81 mg daily-Vial  Calcium Carbonate + D 600 mg twice daily-Vial  Patient declined the following medications:  Voltaren Gel PRN Use  Patient needs refills for None ID .  Confirmed delivery date of 12/19/2020, advised patient that pharmacy will contact them the morning of delivery.  Everlean Cherry Clinical Pharmacist Assistant [phone]

## 2020-12-22 ENCOUNTER — Other Ambulatory Visit: Payer: Self-pay

## 2020-12-22 ENCOUNTER — Ambulatory Visit
Admission: RE | Admit: 2020-12-22 | Discharge: 2020-12-22 | Disposition: A | Discharge status: Home / Self Care | Payer: Medicare HMO | Source: Ambulatory Visit | Attending: Nurse Practitioner | Admitting: Nurse Practitioner

## 2020-12-22 DIAGNOSIS — Z1231 Encounter for screening mammogram for malignant neoplasm of breast: Secondary | ICD-10-CM

## 2020-12-31 ENCOUNTER — Other Ambulatory Visit: Payer: Self-pay

## 2020-12-31 ENCOUNTER — Encounter (HOSPITAL_COMMUNITY): Payer: Self-pay

## 2020-12-31 ENCOUNTER — Emergency Department (HOSPITAL_COMMUNITY)
Admission: EM | Admit: 2020-12-31 | Discharge: 2020-12-31 | Disposition: A | Discharge status: Home / Self Care | Payer: Medicare HMO | Attending: Emergency Medicine | Admitting: Emergency Medicine

## 2020-12-31 DIAGNOSIS — R000 Tachycardia, unspecified: Secondary | ICD-10-CM | POA: Insufficient documentation

## 2020-12-31 DIAGNOSIS — Z79899 Other long term (current) drug therapy: Secondary | ICD-10-CM | POA: Insufficient documentation

## 2020-12-31 DIAGNOSIS — I10 Essential (primary) hypertension: Secondary | ICD-10-CM | POA: Insufficient documentation

## 2020-12-31 DIAGNOSIS — R42 Dizziness and giddiness: Secondary | ICD-10-CM | POA: Diagnosis not present

## 2020-12-31 DIAGNOSIS — I951 Orthostatic hypotension: Secondary | ICD-10-CM | POA: Insufficient documentation

## 2020-12-31 DIAGNOSIS — Z7982 Long term (current) use of aspirin: Secondary | ICD-10-CM | POA: Insufficient documentation

## 2020-12-31 LAB — CBC WITH DIFFERENTIAL/PLATELET
Abs Immature Granulocytes: 0.02 10*3/uL (ref 0.00–0.07)
Basophils Absolute: 0 10*3/uL (ref 0.0–0.1)
Basophils Relative: 1 %
Eosinophils Absolute: 0 10*3/uL (ref 0.0–0.5)
Eosinophils Relative: 1 %
HCT: 42.7 % (ref 36.0–46.0)
Hemoglobin: 13.9 g/dL (ref 12.0–15.0)
Immature Granulocytes: 1 %
Lymphocytes Relative: 23 %
Lymphs Abs: 0.9 10*3/uL (ref 0.7–4.0)
MCH: 30.2 pg (ref 26.0–34.0)
MCHC: 32.6 g/dL (ref 30.0–36.0)
MCV: 92.8 fL (ref 80.0–100.0)
Monocytes Absolute: 0.3 10*3/uL (ref 0.1–1.0)
Monocytes Relative: 8 %
Neutro Abs: 2.8 10*3/uL (ref 1.7–7.7)
Neutrophils Relative %: 66 %
Platelets: 153 10*3/uL (ref 150–400)
RBC: 4.6 MIL/uL (ref 3.87–5.11)
RDW: 11.9 % (ref 11.5–15.5)
WBC: 4.1 10*3/uL (ref 4.0–10.5)
nRBC: 0 % (ref 0.0–0.2)

## 2020-12-31 LAB — COMPREHENSIVE METABOLIC PANEL
ALT: 14 U/L (ref 0–44)
AST: 19 U/L (ref 15–41)
Albumin: 4.1 g/dL (ref 3.5–5.0)
Alkaline Phosphatase: 53 U/L (ref 38–126)
Anion gap: 8 (ref 5–15)
BUN: 13 mg/dL (ref 8–23)
CO2: 27 mmol/L (ref 22–32)
Calcium: 9.6 mg/dL (ref 8.9–10.3)
Chloride: 102 mmol/L (ref 98–111)
Creatinine, Ser: 0.97 mg/dL (ref 0.44–1.00)
GFR, Estimated: >60 mL/min (ref >60)
Glucose, Bld: 119 mg/dL — ABNORMAL HIGH (ref 70–99)
Potassium: 4.2 mmol/L (ref 3.5–5.1)
Sodium: 137 mmol/L (ref 135–145)
Total Bilirubin: 0.4 mg/dL (ref 0.3–1.2)
Total Protein: 7 g/dL (ref 6.5–8.1)

## 2020-12-31 MED ORDER — SODIUM CHLORIDE 0.9 % IV BOLUS
1000.0000 mL | Freq: Once | INTRAVENOUS | Status: AC
  Administered 2020-12-31: 1000 mL via INTRAVENOUS

## 2020-12-31 NOTE — ED Provider Notes (Signed)
[facility] EMERGENCY DEPARTMENT Provider Note   CSN: [id number] Arrival date & time: 12/31/20  2025     History Chief Complaint  Patient presents with  . Dizziness    Ebony Mayer is a 75 y.o. female.  Around 4-5 PM, patient was folding the laundry, set up and felt dizzy She sat back down and the dizziness improved Her grandson came in an hour or so later and asked her to get something, she tried to stand up and felt dizzy again She sat back down and dizziness improved, but did not completely go away Her family called EMS She states that the room did not feel like it was spinning Denies any changes in hearing Denies chest pain, shortness of breath States that dizziness is now resolved and she feels normal States that prior to dizziness, she felt in her normal state of health Denies any myalgias, fever, chills Denies focal weakness        Past Medical History:  Diagnosis Date  . Hypertension   . Hypertension   . Mixed hyperlipidemia 10/19/2017  . Patellofemoral arthritis of left knee 07/08/2016    Patient Active Problem List   Diagnosis Date Noted  . Mixed hyperlipidemia 10/19/2017  . Hyperglycemia 10/19/2017  . Patellofemoral arthritis of left knee 07/08/2016  . It band syndrome, left 06/15/2016  . Essential hypertension, benign 05/31/2016  . Leg pain, lateral 05/31/2016  . Varicose veins of left lower extremity with pain 05/31/2016  . Onychomycosis 10/10/2013  . Porokeratosis 10/10/2013    History reviewed. No pertinent surgical history.   OB History   No obstetric history on file.     Family History  Problem Relation Age of Onset  . Diabetes Mother   . Heart attack Mother   . Cancer Sister   . Heart attack Brother     Social History   Tobacco Use  . Smoking status: Never Smoker  . Smokeless tobacco: Never Used  Substance Use Topics  . Alcohol use: No  . Drug use: No    Home Medications Prior to Admission medications    Medication Sig Start Date End Date Taking? Authorizing Provider  amLODipine (NORVASC) 5 MG tablet Take 1 tablet (5 mg total) by mouth at bedtime. 01/09/20   Nche, Bonna Gains, NP  aspirin EC 81 MG tablet Take 81 mg by mouth daily. Swallow whole.    [provider]  Calcium Carbonate-Vitamin D 600-400 MG-UNIT tablet Take 1 tablet by mouth daily.    [provider]  diclofenac Sodium (VOLTAREN) 1 % GEL Apply 2 g topically in the morning, at noon, and at bedtime. 09/15/20   Nche, Bonna Gains, NP  gabapentin (NEURONTIN) 300 MG capsule TAKE ONE CAPSULE BY MOUTH EVERYDAY AT BEDTIME 12/10/20   Nche, Bonna Gains, NP  hydrochlorothiazide (HYDRODIURIL) 25 MG tablet TAKE ONE TABLET BY MOUTH EVERY MORNING 07/14/20   Nche, Bonna Gains, NP  potassium chloride SA (KLOR-CON) 20 MEQ tablet TAKE ONE TABLET BY MOUTH EVERY MORNING 07/14/20   Nche, Bonna Gains, NP    Allergies    Patient has no known allergies.  Review of Systems   Review of Systems  Constitutional: Negative for activity change, appetite change, chills, fatigue and fever.  HENT: Negative for congestion and sore throat.   Eyes: Negative for visual disturbance.  Respiratory: Negative for cough and shortness of breath.   Cardiovascular: Negative for chest pain and leg swelling.  Gastrointestinal: Negative for abdominal pain, diarrhea, nausea and vomiting.  Genitourinary: Negative for difficulty urinating, dysuria and hematuria.  Musculoskeletal: Negative for gait problem.  Neurological: Positive for dizziness. Negative for syncope, facial asymmetry, speech difficulty, weakness, numbness and headaches.    Physical Exam Updated Vital Signs BP 137/65   Pulse (!) 101   Temp 97.8 F (36.6 C) (Oral)   Resp (!) 23   Ht 5\' 5"  (1.651 m)   Wt 72.6 kg   SpO2 100%   BMI 26.63 kg/m   Physical Exam Vitals reviewed.  Constitutional:      General: She is not in acute distress.    Appearance: Normal appearance. She is not  ill-appearing or toxic-appearing.  HENT:     Head: Normocephalic and atraumatic.     Right Ear: External ear normal.     Ears:     Comments: Dizziness with , no nystagmus    Nose: Nose normal.     Mouth/Throat:     Mouth: Mucous membranes are moist.     Comments: No teeth Eyes:     Extraocular Movements: Extraocular movements intact.     Pupils: Pupils are equal, round, and reactive to light.  Cardiovascular:     Rate and Rhythm: Regular rhythm. Tachycardia present.     Heart sounds: No murmur heard. No friction rub. No gallop.   Pulmonary:     Effort: Pulmonary effort is normal.     Breath sounds: Normal breath sounds. No wheezing, rhonchi or rales.  Abdominal:     General: Abdomen is flat.     Palpations: Abdomen is soft.     Tenderness: There is no abdominal tenderness. There is no guarding or rebound.  Musculoskeletal:     Cervical back: Normal range of motion and neck supple.     Right lower leg: No edema.     Left lower leg: No edema.     Comments: 5/5 strength BUE/BLE  Lymphadenopathy:     Cervical: No cervical adenopathy.  Skin:    General: Skin is warm and dry.  Neurological:     General: No focal deficit present.     Mental Status: She is alert.     Cranial Nerves: No cranial nerve deficit.     Sensory: No sensory deficit.     Motor: No weakness.     Comments: Intact finger-to-nose and heel-to-shin     ED Results / Procedures / Treatments   Labs (all labs ordered are listed, but only abnormal results are displayed) Labs Reviewed  COMPREHENSIVE METABOLIC PANEL - Abnormal; Notable for the following components:      Result Value   Glucose, Bld 119 (*)    All other components within normal limits  CBC WITH DIFFERENTIAL/PLATELET    EKG EKG Interpretation  Date/Time:  Wednesday December 31 2020 21:22:36 EDT Ventricular Rate:  95 PR Interval:  201 QRS Duration: 86 QT Interval:  327 QTC Calculation: 411 R Axis:   -25 Text  Interpretation: Sinus rhythm Abnormal R-wave progression, early transition LVH by voltage No significant change since last tracing Confirmed by 05-16-1978 [phone]) on 12/31/2020 10:11:29 PM   Radiology No results found.  Procedures Procedures   Medications Ordered in ED Medications  sodium chloride 0.9 % bolus 1,000 mL (1,000 mLs Intravenous New Bag/Given 12/31/20 2114)    ED Course  I have reviewed the triage vital signs and the nursing notes.  Pertinent labs & imaging results that were available during my care of the patient were reviewed by me and considered in  my medical decision making (see chart for details).    MDM Rules/Calculators/A&P                          75 year old female with past medical history of hypertension hyperlipidemia who presents with positional dizziness that started this afternoon.  She is mildly tachycardic on presentation.  Briefly stood up in room and she became tachycardic to 120s.  She is neurologically intact on exam.  Her symptoms are very positional, do not suspect PE or ACS.  Symptoms do not appear to be related to vertigo.  Most likely cause is dehydration with orthostatic hypotension.  Will obtain EKG, CBC, CMP, give patient a 1 L bolus, also obtain orthostatic vitals.  Following hydration, patient can likely discharge home.  Labs WNL, patient received IV fluids and performed orthostatic vitals.  She felt much improved and was without complaints.  Patient able to walk in room without symptoms returning.  Vitals improved and patient is stable for discharge.  She agrees to discharge home with PCP follow up.    Final Clinical Impression(s) / ED Diagnoses Final diagnoses:  Dizziness  Orthostatic hypotension    Rx / DC Orders ED Discharge Orders    None       Unknown Jim, DO 12/31/20 2212    Charlynne Pander, MD 12/31/20 [phone]

## 2020-12-31 NOTE — Discharge Instructions (Signed)
As we discussed, your dizziness was likely from dehydration and orthostatic hypotension, which we talked about is when your blood pressure drops when you stand which can happen sometimes from dehydration.  Make sure that you stay well-hydrated and follow-up with your regular doctor in about a week.  If you have worsening of your symptoms, you start having dizziness even when you are not moving, you develop chest pain, you should be seen right away.

## 2020-12-31 NOTE — ED Notes (Signed)
Pt ambulated to the restroom and back with no need for assistance. No complaints of dizziness.

## 2020-12-31 NOTE — ED Triage Notes (Signed)
EMS reports pt is from home. Pt was sitting down folding laundry. Became dizzy and dizziness worsened when attempting to get up. Denies nausea/vomiting, SOB, or chest pain.

## 2021-01-06 ENCOUNTER — Other Ambulatory Visit: Payer: Self-pay | Admitting: Nurse Practitioner

## 2021-01-06 DIAGNOSIS — I10 Essential (primary) hypertension: Secondary | ICD-10-CM

## 2021-01-06 DIAGNOSIS — E876 Hypokalemia: Secondary | ICD-10-CM

## 2021-01-06 NOTE — Telephone Encounter (Signed)
Last OV 07/09/20 Ok to fill?

## 2021-01-07 ENCOUNTER — Other Ambulatory Visit: Payer: Self-pay

## 2021-01-08 ENCOUNTER — Ambulatory Visit (INDEPENDENT_AMBULATORY_CARE_PROVIDER_SITE_OTHER): Payer: Medicare HMO | Admitting: Nurse Practitioner

## 2021-01-08 ENCOUNTER — Encounter: Payer: Self-pay | Admitting: Nurse Practitioner

## 2021-01-08 VITALS — BP 120/62 | HR 99 | Temp 97.8°F | Ht 65.0 in | Wt 162.6 lb

## 2021-01-08 DIAGNOSIS — T502X5A Adverse effect of carbonic-anhydrase inhibitors, benzothiadiazides and other diuretics, initial encounter: Secondary | ICD-10-CM

## 2021-01-08 DIAGNOSIS — I10 Essential (primary) hypertension: Secondary | ICD-10-CM

## 2021-01-08 DIAGNOSIS — E876 Hypokalemia: Secondary | ICD-10-CM

## 2021-01-08 MED ORDER — AMLODIPINE BESYLATE 5 MG PO TABS: ORAL_TABLET | ORAL | 3 refills | Status: DC

## 2021-01-08 MED ORDER — POTASSIUM CHLORIDE CRYS ER 20 MEQ PO TBCR: 20.0000 meq | EXTENDED_RELEASE_TABLET | Freq: Every morning | ORAL | 1 refills | Status: DC

## 2021-01-08 MED ORDER — HYDROCHLOROTHIAZIDE 25 MG PO TABS
1.0000 | ORAL_TABLET | Freq: Every morning | ORAL | 1 refills | Status: DC
Start: 2021-01-08 — End: 2021-07-10

## 2021-01-08 NOTE — Patient Instructions (Signed)
Monitor BP at home 2x/week Call office if BP>140/90 or <110/70. Maintain current medications

## 2021-01-08 NOTE — Assessment & Plan Note (Addendum)
BP at goal Reviewed ECG, cbc and CMP completed by ED provider on 12/31/2020 Reviewed recent ED visit for dizziness. She reports dizziness has completely resolved BP Readings from Last 3 Encounters:  01/08/21 120/62  12/31/20 (!) 130/107  07/09/20 128/70   Advised to monitor BP at at least 2x/weel She is if call office if >140/80 or <110/70. Maintain current medications

## 2021-01-08 NOTE — Progress Notes (Signed)
Subjective:  Patient ID: Ebony Mayer, female    DOB: [birth year]  Age: 75 y.o. MRN: [id number]  CC: Follow-up (6 month f/u on HTN and hyperlipidemia)  HPI Resolved dizziness  Essential hypertension, benign BP at goal Reviewed ECG, cbc and CMP completed by ED provider on 12/31/2020 Reviewed recent ED visit for dizziness. She reports dizziness has completely resolved BP Readings from Last 3 Encounters:  01/08/21 120/62  12/31/20 (!) 130/107  07/09/20 128/70   Advised to monitor BP at at least 2x/weel She is if call office if >140/80 or <110/70. Maintain current medications  Wt Readings from Last 3 Encounters:  01/08/21 162 lb 9.6 oz (73.8 kg)  12/31/20 160 lb (72.6 kg)  07/10/20 160 lb (72.6 kg)   Reviewed past Medical, Social and Family history today.  Outpatient Medications Prior to Visit  Medication Sig Dispense Refill  . aspirin EC 81 MG tablet Take 81 mg by mouth daily. Swallow whole.    . Calcium Carbonate-Vitamin D 600-400 MG-UNIT tablet Take 1 tablet by mouth daily.    . diclofenac Sodium (VOLTAREN) 1 % GEL Apply 2 g topically in the morning, at noon, and at bedtime. 100 g 0  . gabapentin (NEURONTIN) 300 MG capsule TAKE ONE CAPSULE BY MOUTH EVERYDAY AT BEDTIME 90 capsule 3  . amLODipine (NORVASC) 5 MG tablet TAKE ONE TABLET BY MOUTH EVERYDAY AT BEDTIME 90 tablet 1  . hydrochlorothiazide (HYDRODIURIL) 25 MG tablet TAKE ONE TABLET BY MOUTH EVERY MORNING 90 tablet 0  . potassium chloride SA (KLOR-CON) 20 MEQ tablet TAKE ONE TABLET BY MOUTH EVERY MORNING 90 tablet 0   No facility-administered medications prior to visit.    ROS See HPI  Objective:  BP 120/62 (BP Location: Left Arm, Patient Position: Sitting, Cuff Size: Normal)   Pulse 99   Temp 97.8 F (36.6 C) (Temporal)   Ht 5\' 5"  (1.651 m)   Wt 162 lb 9.6 oz (73.8 kg)   SpO2 99%   BMI 27.06 kg/m   Physical Exam Vitals reviewed.  Cardiovascular:     Rate and Rhythm: Normal rate and regular rhythm.      Pulses: Normal pulses.     Heart sounds: Normal heart sounds.  Pulmonary:     Effort: Pulmonary effort is normal.     Breath sounds: Normal breath sounds.  Musculoskeletal:     Right lower leg: No edema.     Left lower leg: No edema.  Neurological:     Mental Status: She is alert and oriented to person, place, and time.     Assessment & Plan:  This visit occurred during the SARS-CoV-2 public health emergency.  Safety protocols were in place, including screening questions prior to the visit, additional usage of staff PPE, and extensive cleaning of exam room while observing appropriate contact time as indicated for disinfecting solutions.   Roben was seen today for follow-up.  Diagnoses and all orders for this visit:  Essential hypertension, benign -     hydrochlorothiazide (HYDRODIURIL) 25 MG tablet; Take 1 tablet (25 mg total) by mouth every morning. -     amLODipine (NORVASC) 5 MG tablet; TAKE ONE TABLET BY MOUTH EVERYDAY AT BEDTIME -     potassium chloride SA (KLOR-CON) 20 MEQ tablet; Take 1 tablet (20 mEq total) by mouth every morning.  Diuretic-induced hypokalemia -     potassium chloride SA (KLOR-CON) 20 MEQ tablet; Take 1 tablet (20 mEq total) by mouth every morning.    Problem List Items  Addressed This Visit      Cardiovascular and Mediastinum   Essential hypertension, benign - Primary    BP at goal Reviewed ECG, cbc and CMP completed by ED provider on 12/31/2020 Reviewed recent ED visit for dizziness. She reports dizziness has completely resolved BP Readings from Last 3 Encounters:  01/08/21 120/62  12/31/20 (!) 130/107  07/09/20 128/70   Advised to monitor BP at at least 2x/weel She is if call office if >140/80 or <110/70. Maintain current medications      Relevant Medications   hydrochlorothiazide (HYDRODIURIL) 25 MG tablet   amLODipine (NORVASC) 5 MG tablet   potassium chloride SA (KLOR-CON) 20 MEQ tablet    Other Visit Diagnoses    Diuretic-induced  hypokalemia       Relevant Medications   potassium chloride SA (KLOR-CON) 20 MEQ tablet      Follow-up: Return in about 6 months (around 07/10/2021) for HTN, hyperglycemia, hyperlipidemia (fasting).  [name], NP

## 2021-01-16 ENCOUNTER — Telehealth: Payer: Self-pay

## 2021-01-16 NOTE — Progress Notes (Signed)
    Chronic Care Management Pharmacy Assistant   Name: Ebony Mayer  MRN: [id number] DOB: 07-12-1946  Reason for Encounter: Medication Review/Medication Coordination call.   Recent office visits:  01/08/2021 [name] NP   Recent consult visits:  No recent Consult Visit  Hospital visits:  Medication Reconciliation was completed by comparing discharge summary, patient's EMR and Pharmacy list, and upon discussion with patient.  Admitted to the hospital on 12/31/2020 due to Dizziness. Discharge date was 12/31/2020. Discharged from [facility].    New?Medications Started at [facility] Discharge:?? -started None   Medication Changes at Hospital Discharge: -Changed None  Medications Discontinued at Hospital Discharge: -Stopped None   Medications that remain the same after Hospital Discharge:??  -All other medications will remain the same.    Medications: Outpatient Encounter Medications as of 01/16/2021  Medication Sig  . amLODipine (NORVASC) 5 MG tablet TAKE ONE TABLET BY MOUTH EVERYDAY AT BEDTIME  . aspirin EC 81 MG tablet Take 81 mg by mouth daily. Swallow whole.  . Calcium Carbonate-Vitamin D 600-400 MG-UNIT tablet Take 1 tablet by mouth daily.  . diclofenac Sodium (VOLTAREN) 1 % GEL Apply 2 g topically in the morning, at noon, and at bedtime.  . gabapentin (NEURONTIN) 300 MG capsule TAKE ONE CAPSULE BY MOUTH EVERYDAY AT BEDTIME  . hydrochlorothiazide (HYDRODIURIL) 25 MG tablet Take 1 tablet (25 mg total) by mouth every morning.  . potassium chloride SA (KLOR-CON) 20 MEQ tablet Take 1 tablet (20 mEq total) by mouth every morning.   No facility-administered encounter medications on file as of 01/16/2021.    Star Rating Drugs: None   Reviewed chart for medication changes ahead of medication coordination call.   BP Readings from Last 3 Encounters:  01/08/21 120/62  12/31/20 (!) 130/107  07/09/20 128/70    Lab Results  Component Value Date   HGBA1C  5.2 01/07/2020     Patient obtains medications through Adherence Packaging  30 Days   Last adherence delivery included:   Gabapentin 300 mg daily -Bedtime Amlodipine 5 mg daily -Bedtime Hydrochlorothiazide 25 mg daily -Breakfast Potassium Cl ER 20 Meq daily -Breakfast  Aspirin 81 mg daily-Vial  Calcium Carbonate + D 600 mg twice daily-Vial  Patient declined medications last month   Voltaren Gel PRN Use Patient is due for next adherence delivery on: 01/22/2021. Called patient and reviewed medications and coordinated delivery.  This delivery to include:  Gabapentin 300 mg daily -Bedtime Amlodipine 5 mg daily -Bedtime Hydrochlorothiazide 25 mg daily -Breakfast Potassium Cl ER 20 Meq daily -Breakfast  Aspirin 81 mg daily-Vial  Calcium Carbonate + D 600 mg twice daily-Vial  Voltaren 1%  Gel PRN Use  Patient declined the following medications   None ID  Patient needs refills for Voltaren 1%  Gel,  .  Confirmed delivery date of 01/22/2021, advised patient that pharmacy will contact them the morning of delivery.  Patient states she is going to purchase another blood pressure machine because she can not fine the one she has.  Everlean Cherry Clinical Pharmacist Assistant [phone]

## 2021-01-19 ENCOUNTER — Other Ambulatory Visit: Payer: Self-pay | Admitting: Nurse Practitioner

## 2021-02-12 ENCOUNTER — Telehealth: Payer: Self-pay

## 2021-02-12 NOTE — Progress Notes (Signed)
    Chronic Care Management Pharmacy Assistant   Name: Ebony Mayer  MRN: [id number] DOB: 30-Jul-1946  Reason for Encounter: Medication Review/Medication Coordination Call  Recent office visits:  No recent office visits  Recent consult visits:  No recent Consult visits  Hospital visits:  None in previous 6 months  Medications: Outpatient Encounter Medications as of 02/12/2021  Medication Sig  . amLODipine (NORVASC) 5 MG tablet TAKE ONE TABLET BY MOUTH EVERYDAY AT BEDTIME  . aspirin EC 81 MG tablet Take 81 mg by mouth daily. Swallow whole.  . Calcium Carbonate-Vitamin D 600-400 MG-UNIT tablet Take 1 tablet by mouth daily.  . diclofenac Sodium (VOLTAREN) 1 % GEL apply TWO grams TO affected area(s) IN THE MORNING, AT NOON, AND AT BEDTIME  . gabapentin (NEURONTIN) 300 MG capsule TAKE ONE CAPSULE BY MOUTH EVERYDAY AT BEDTIME  . hydrochlorothiazide (HYDRODIURIL) 25 MG tablet Take 1 tablet (25 mg total) by mouth every morning.  . potassium chloride SA (KLOR-CON) 20 MEQ tablet Take 1 tablet (20 mEq total) by mouth every morning.   No facility-administered encounter medications on file as of 02/12/2021.    Star Rating Drugs: None ID  Reviewed chart for medication changes ahead of medication coordination call.  No OVs, Consults, or hospital visits since last care coordination call/Pharmacist visit. (If appropriate, list visit date, provider name)  No medication changes indicated OR if recent visit, treatment plan here.  BP Readings from Last 3 Encounters:  01/08/21 120/62  12/31/20 (!) 130/107  07/09/20 128/70    Lab Results  Component Value Date   HGBA1C 5.2 01/07/2020     Patient obtains medications through Adherence Packaging  30 Days    Last adherence delivery included:    Gabapentin 300 mg daily -Bedtime  Amlodipine 5 mg daily -Bedtime  Hydrochlorothiazide 25 mg daily -Breakfast  Potassium Cl ER 20 Meq daily -Breakfast    Aspirin 81 mg daily-Vial    Calcium Carbonate + D 600 mg twice daily-Vial    Voltaren 1%  Gel PRN Use   Patient declined medications: None ID  Patient is due for next adherence delivery on: 02/19/2021 1st Route Called patient and reviewed medications and coordinated delivery.  This delivery to include:   Gabapentin 300 mg daily -Bedtime  Amlodipine 5 mg daily -Bedtime  Hydrochlorothiazide 25 mg daily -Breakfast  Potassium Cl ER 20 Meq daily -Breakfast    Aspirin 81 mg daily-Vial   Calcium Carbonate + D 600 mg twice daily-Vial   Patient declined the following medications   Voltaren 1%  Gel PRN Use   Patient needs refills for None ID  Confirmed delivery date of 02/19/2021 advised patient that pharmacy will contact them the morning of delivery.  Scheduled a telephone appointment with the clinical Pharmacist on 04/27/2021 @ 1100 am. Message sent to the scheduler.  Everlean Cherry Clinical Pharmacist Assistant [phone]

## 2021-02-16 ENCOUNTER — Other Ambulatory Visit: Payer: Self-pay | Admitting: Nurse Practitioner

## 2021-03-12 ENCOUNTER — Telehealth: Payer: Self-pay

## 2021-03-12 NOTE — Progress Notes (Signed)
    Chronic Care Management Pharmacy Assistant   Name: Ebony Mayer  MRN: [id number] DOB: 04-25-1946  Reason for Encounter: Medication Review/Adherence Review   Recent office visits:  None ID  Recent consult visits:  None ID  Hospital visits:  None in previous 6 months  Medications: Outpatient Encounter Medications as of 03/12/2021  Medication Sig   amLODipine (NORVASC) 5 MG tablet TAKE ONE TABLET BY MOUTH EVERYDAY AT BEDTIME   aspirin EC 81 MG tablet Take 81 mg by mouth daily. Swallow whole.   Calcium Carbonate-Vitamin D 600-400 MG-UNIT tablet Take 1 tablet by mouth daily.   diclofenac Sodium (VOLTAREN) 1 % GEL apply TWO grams TO affected area(s) IN THE MORNING, AT NOON, AND AT BEDTIME   gabapentin (NEURONTIN) 300 MG capsule TAKE ONE CAPSULE BY MOUTH EVERYDAY AT BEDTIME   hydrochlorothiazide (HYDRODIURIL) 25 MG tablet Take 1 tablet (25 mg total) by mouth every morning.   potassium chloride SA (KLOR-CON) 20 MEQ tablet Take 1 tablet (20 mEq total) by mouth every morning.   No facility-administered encounter medications on file as of 03/12/2021.    Star Rating Drugs: None ID  Care Gaps: Per patient chart,patient is due for the following care Gaps: Shingrix and COVID-19.  Patient is schedule for a telephone appointment with the clinical Pharmacist on 04/27/2021 @ 1100 am.  Patient is schedule for a annual wellness visit on 07/14/2021 at her PCP office.  Everlean Cherry Clinical Pharmacist Assistant [phone]

## 2021-03-13 ENCOUNTER — Telehealth: Payer: Self-pay

## 2021-03-13 NOTE — Progress Notes (Signed)
    Chronic Care Management Pharmacy Assistant   Name: Celisa Schoenberg  MRN: [id number] DOB: [birth year]   Reason for Encounter: Medication Review/Medication Coordination Call.   Recent office visits:  No recent Office Visit  Recent consult visits:  No recent Consult Visit   Hospital visits:  None in previous 6 months  Medications: Outpatient Encounter Medications as of 03/13/2021  Medication Sig   amLODipine (NORVASC) 5 MG tablet TAKE ONE TABLET BY MOUTH EVERYDAY AT BEDTIME   aspirin EC 81 MG tablet Take 81 mg by mouth daily. Swallow whole.   Calcium Carbonate-Vitamin D 600-400 MG-UNIT tablet Take 1 tablet by mouth daily.   diclofenac Sodium (VOLTAREN) 1 % GEL apply TWO grams TO THE AFFECTED AREA(S) EVERY MORNING, AT NOON, AT bedtime   gabapentin (NEURONTIN) 300 MG capsule TAKE ONE CAPSULE BY MOUTH EVERYDAY AT BEDTIME   hydrochlorothiazide (HYDRODIURIL) 25 MG tablet Take 1 tablet (25 mg total) by mouth every morning.   potassium chloride SA (KLOR-CON) 20 MEQ tablet Take 1 tablet (20 mEq total) by mouth every morning.   No facility-administered encounter medications on file as of 03/13/2021.    Care Gaps: Annual Wellness Visit Star Rating Drugs: None ID  Reviewed chart for medication changes ahead of medication coordination call.   BP Readings from Last 3 Encounters:  01/08/21 120/62  12/31/20 (!) 130/107  07/09/20 128/70    Lab Results  Component Value Date   HGBA1C 5.2 01/07/2020     Patient obtains medications through Adherence Packaging  30 Days   Last adherence delivery included:   Gabapentin 300 mg daily - Bedtime   Amlodipine 5 mg daily - Bedtime   Hydrochlorothiazide 25 mg daily - Breakfast   Potassium Cl ER 20 Meq daily - Breakfast   Aspirin 81 mg daily- Vial   Calcium Carbonate + D 600 mg twice daily- Vial   Patient declined medication last month  Voltaren 1%  Gel PRN Use  Patient is due for next adherence delivery on: 03/23/2021. Called patient and  reviewed medications and coordinated delivery.  This delivery to include:  Gabapentin 300 mg daily - Bedtime   Amlodipine 5 mg daily - Bedtime   Hydrochlorothiazide 25 mg daily - Breakfast   Potassium Cl ER 20 Meq daily - Breakfast   Aspirin 81 mg daily- Vial   Calcium Carbonate + D 600 mg twice daily- Vial    Patient declined the following medications: Voltaren 1%  Gel PRN Use  Patient needs refills for None ID.  Confirmed delivery date of 03/23/2021, advised patient that pharmacy will contact them the morning of delivery.   Patient is schedule for a telephone follow up with the clinical pharmacist on 04/27/2021. Patient is schedule for a annual wellness visit at her PCP office on 07/14/2021.  Patient is asking for a referral or order for her to get her Bone density scan that was recommended.Notified Clinical Pharmacist.  Everlean Cherry Clinical Pharmacist Assistant [phone]

## 2021-03-19 ENCOUNTER — Other Ambulatory Visit: Payer: Self-pay

## 2021-03-19 DIAGNOSIS — Z9189 Other specified personal risk factors, not elsewhere classified: Secondary | ICD-10-CM

## 2021-04-14 ENCOUNTER — Telehealth: Payer: Self-pay

## 2021-04-14 NOTE — Progress Notes (Signed)
    Chronic Care Management Pharmacy Assistant   Name: Ebony Mayer  MRN: [id number] DOB: [birth year]  Reason for Encounter: Medication Review/Medication Coordination Call.   Recent office visits:  No recent Office Visit  Recent consult visits:  No recent Consult Visit  Hospital visits:  None in previous 6 months  Medications: Outpatient Encounter Medications as of 04/14/2021  Medication Sig   amLODipine (NORVASC) 5 MG tablet TAKE ONE TABLET BY MOUTH EVERYDAY AT BEDTIME   aspirin EC 81 MG tablet Take 81 mg by mouth daily. Swallow whole.   Calcium Carbonate-Vitamin D 600-400 MG-UNIT tablet Take 1 tablet by mouth daily.   diclofenac Sodium (VOLTAREN) 1 % GEL apply TWO grams TO THE AFFECTED AREA(S) EVERY MORNING, AT NOON, AT bedtime   gabapentin (NEURONTIN) 300 MG capsule TAKE ONE CAPSULE BY MOUTH EVERYDAY AT BEDTIME   hydrochlorothiazide (HYDRODIURIL) 25 MG tablet Take 1 tablet (25 mg total) by mouth every morning.   potassium chloride SA (KLOR-CON) 20 MEQ tablet Take 1 tablet (20 mEq total) by mouth every morning.   No facility-administered encounter medications on file as of 04/14/2021.    Care Gaps: Shingrix Vaccine COVID-19 Vaccine Influenza Vaccine Star Rating Drugs: None ID Medication Fill Gaps: None ID  Reviewed chart for medication changes ahead of medication coordination call.   BP Readings from Last 3 Encounters:  01/08/21 120/62  12/31/20 (!) 130/107  07/09/20 128/70    Lab Results  Component Value Date   HGBA1C 5.2 01/07/2020     Patient obtains medications through Adherence Packaging  30 Days   Last adherence delivery included:   Gabapentin 300 mg daily - Bedtime   Amlodipine 5 mg one tablet daily - Bedtime   Hydrochlorothiazide 25 mg one tablet daily - Breakfast   Potassium Cl ER 20 Meq one tablet daily - Breakfast   Aspirin 81 mg  one tablet daily- Vial   Calcium Carbonate + D 600 mg twice daily- Vial  Patient declined medications last  month: Voltaren 1%  Gel PRN Use  Patient is due for next adherence delivery on: 04/21/2021. Called patient and reviewed medications and coordinated delivery.  This delivery to include: Gabapentin 300 mg daily - Bedtime   Amlodipine 5 mg one tablet daily - Bedtime   Hydrochlorothiazide 25 mg one tablet daily - Breakfast   Potassium Cl ER 20 Meq one tablet daily - Breakfast   Aspirin 81 mg  one tablet daily- Vial   Calcium Carbonate + D 600 mg twice daily- Vial  Patient declined the following medications: Voltaren 1%  Gel PRN Use  Patient needs refills for None ID.  Confirmed delivery date of 04/21/2021, advised patient that pharmacy will contact them the morning of delivery.  Patient is schedule for a telephone follow up on 04/27/2021 with Clinical Pharmacist. Patient is schedule for follow up with her PCP on 07/10/2021.  Patient states she has not been checking her blood pressure at home because she is in the process of purchasing one.Patient states she is unsure how to use the monitor.Informed patient she has a follow up with the clinical pharmacist on 04/27/2021, if she would like to go over how to correctly use the blood pressure machine.Patient states "that would be good".  Everlean Cherry Clinical Pharmacist Assistant [phone]

## 2021-04-24 ENCOUNTER — Telehealth: Payer: Self-pay

## 2021-04-24 NOTE — Progress Notes (Signed)
Left a Voice message to confirmed patient telephone appointment on 04/27/2021 for CCM at 11:00 am with Angelena Sole the Clinical pharmacist.   Everlean Cherry Clinical Pharmacist Assistant [phone]

## 2021-04-27 ENCOUNTER — Telehealth: Payer: Medicare HMO

## 2021-04-27 ENCOUNTER — Telehealth: Payer: Self-pay

## 2021-04-27 NOTE — Progress Notes (Signed)
Patient states she would like to reschedule  telephone appointment on 04/27/2021 for CCM at 11:00 am with Angelena Sole the Clinical pharmacist to 06/01/2021 at 9:00 am.Patient denies any issue or side effects from current medications.  Everlean Cherry Clinical Pharmacist Assistant [phone]

## 2021-05-14 ENCOUNTER — Telehealth: Payer: Self-pay

## 2021-05-14 NOTE — Progress Notes (Signed)
    Chronic Care Management Pharmacy Assistant   Name: Ebony Mayer  MRN: [id number] DOB: 1945/11/16  Reason for Encounter: Medication Review.Medication Coordination Call   Recent office visits:  No recent Office Visit   Recent consult visits:  No recent Consult Visit   Hospital visits:  None in previous 6 months  Medications: Outpatient Encounter Medications as of 05/14/2021  Medication Sig   amLODipine (NORVASC) 5 MG tablet TAKE ONE TABLET BY MOUTH EVERYDAY AT BEDTIME   aspirin EC 81 MG tablet Take 81 mg by mouth daily. Swallow whole.   Calcium Carbonate-Vitamin D 600-400 MG-UNIT tablet Take 1 tablet by mouth daily.   diclofenac Sodium (VOLTAREN) 1 % GEL apply TWO grams TO THE AFFECTED AREA(S) EVERY MORNING, AT NOON, AT bedtime   gabapentin (NEURONTIN) 300 MG capsule TAKE ONE CAPSULE BY MOUTH EVERYDAY AT BEDTIME   hydrochlorothiazide (HYDRODIURIL) 25 MG tablet Take 1 tablet (25 mg total) by mouth every morning.   potassium chloride SA (KLOR-CON) 20 MEQ tablet Take 1 tablet (20 mEq total) by mouth every morning.   No facility-administered encounter medications on file as of 05/14/2021.    Care Gaps: Shingrix Vaccine COVID-19 Vaccine Influenza Vaccine Star Rating Drugs: None ID Medication Fill Gaps: None ID  Reviewed chart for medication changes ahead of medication coordination call.  BP Readings from Last 3 Encounters:  01/08/21 120/62  12/31/20 (!) 130/107  07/09/20 128/70    Lab Results  Component Value Date   HGBA1C 5.2 01/07/2020     Patient obtains medications through Adherence Packaging  30 Days   Last adherence delivery included:  Gabapentin 300 mg daily - Bedtime   Amlodipine 5 mg one tablet daily - Bedtime   Hydrochlorothiazide 25 mg one tablet daily - Breakfast   Potassium Cl ER 20 Meq one tablet daily - Breakfast   Aspirin 81 mg  one tablet daily- Vial   Calcium Carbonate + D 600 mg twice daily- Vial  Patient declined medication last month   Voltaren 1%  Gel PRN Use  Patient is due for next adherence delivery on: 0908/2022. Called patient and reviewed medications and coordinated delivery.  This delivery to include: Gabapentin 300 mg daily - Bedtime   Amlodipine 5 mg one tablet daily - Bedtime   Hydrochlorothiazide 25 mg one tablet daily - Breakfast   Potassium Cl ER 20 Meq one tablet daily - Breakfast   Aspirin 81 mg  one tablet daily- Vial   Calcium Carbonate + D 600 mg twice daily- Vial  Patient declined the following medications  Voltaren 1%  Gel PRN Use  Patient needs refills for None ID .  Confirmed delivery date of 05/21/2021, advised patient that pharmacy will contact them the morning of delivery.  Ebony Mayer Clinical Pharmacist Assistant [phone]    Patient is schedule for a telephone follow up on 06/01/2021 with Clinical Pharmacist. Patient is schedule for follow up with her PCP on 07/10/2021  [facility] Clinical Pharmacist Assistant [phone]

## 2021-05-29 ENCOUNTER — Telehealth: Payer: Self-pay

## 2021-05-29 NOTE — Progress Notes (Signed)
APPOINTMENT REMINDER   Called [facility], No answer, left message of appointment on 06/01/2021 at 9:00 am  via telephone visit with Angelena Sole , Pharm D. Notified to have all medications, supplements, blood pressure logs available during appointment and to return call if need to reschedule.  Care Gaps: Shingrix Vaccine COVID-19 Vaccine Influenza Vaccine Fecal DNA Star Rating Drugs: None ID Medication Fill Gaps: None ID  Everlean Cherry Clinical Pharmacist Assistant [phone]

## 2021-06-01 ENCOUNTER — Ambulatory Visit (INDEPENDENT_AMBULATORY_CARE_PROVIDER_SITE_OTHER): Payer: Medicare HMO

## 2021-06-01 DIAGNOSIS — M85851 Other specified disorders of bone density and structure, right thigh: Secondary | ICD-10-CM

## 2021-06-01 DIAGNOSIS — E782 Mixed hyperlipidemia: Secondary | ICD-10-CM

## 2021-06-01 DIAGNOSIS — I10 Essential (primary) hypertension: Secondary | ICD-10-CM

## 2021-06-01 NOTE — Patient Instructions (Signed)
Visit Information It was great speaking with you today!  Please let me know if you have any questions about our visit.   Goals Addressed             This Visit's Progress    Track and Manage My Blood Pressure-Hypertension       Timeframe:  Long-Range Goal Priority:  High Start Date:  06/01/2021                            Expected End Date: 06/01/2022                      Follow Up within 90 days   - check blood pressure weekly - write blood pressure results in a log or diary    Why is this important?   You won't feel high blood pressure, but it can still hurt your blood vessels.  High blood pressure can cause heart or kidney problems. It can also cause a stroke.  Making lifestyle changes like losing a little weight or eating less salt will help.  Checking your blood pressure at home and at different times of the day can help to control blood pressure.  If the doctor prescribes medicine remember to take it the way the doctor ordered.  Call the office if you cannot afford the medicine or if there are questions about it.     Notes:         Patient Care Plan: General Pharmacy (Adult)     Problem Identified: Hypertension, Hyperlipidemia, and Osteopenia   Priority: High     Long-Range Goal: Patient-Specific Goal   Start Date: 06/01/2021  Expected End Date: 06/01/2022  This Visit's Progress: On track  Priority: High  Note:   Current Barriers:  No barriers noted  Pharmacist Clinical Goal(s):  Patient will maintain control of blood pressure as evidenced by BP less than 140/90  through collaboration with PharmD and provider.   Interventions: 1:1 collaboration with Nche, Bonna Gains, NP regarding development and update of comprehensive plan of care as evidenced by provider attestation and co-signature Inter-disciplinary care team collaboration (see longitudinal plan of care) Comprehensive medication review performed; medication list updated in electronic medical  record  Hypertension (BP goal <140/90) -Controlled -Current treatment: Amlodipine 5 mg nightly  Hydrochlorothiazide 25 mg daily  -Medications previously tried: NA  -Current home readings: Has blood pressure monitor, but has not set it up yet -Current dietary habits: Oatmeal or Raisin Bran for breakfast. Lunch Greens + Fish OR  -Current exercise habits: Active in the house.  -Denies hypotensive/hypertensive symptoms -Educated on Exercise goal of 150 minutes per week; -Counseled to monitor BP at home 2-3 times weekly, document, and provide log at future appointments -Recommended to continue current medication  Hyperlipidemia: (LDL goal < 100) -Not ideally controlled -Current treatment: None -Medications previously tried: NA  -Educated on Importance of limiting foods high in cholesterol; -Counseled on diet and exercise extensively  Osteopenia (Goal Prevent bone fractures) -Controlled -Patient is not a candidate for pharmacologic treatment -Current treatment  Calcium + D3 1 tablet daily  -Medications previously tried: NA  -Recommend weight-bearing and muscle strengthening exercises for building and maintaining bone density. -Reschedule DEXA -Recommended to continue current medication  Patient Goals/Self-Care Activities Patient will:  - check blood pressure weekly, document, and provide at future appointments  Follow Up Plan: Telephone follow up appointment with care management team member scheduled for:  11/30/2021 at  8:30 AM      Patient agreed to services and verbal consent obtained.   The patient verbalized understanding of instructions, educational materials, and care plan provided today and declined offer to receive copy of patient instructions, educational materials, and care plan.   Angelena Sole, PharmD, P[place]y [place], CPP Clinical Pharmacist [facility] at [facility]  [phone]

## 2021-06-01 NOTE — Progress Notes (Signed)
Chronic Care Management Pharmacy Note  06/01/2021 Name:  Ebony Mayer MRN:  [id number] DOB:  1946-04-15  Summary: Patient presents for CCM follow-up. She reports doing well, although her grandson was recently hospitalized with pneumonia. She has not been monitoring her blood pressure at home. Her DEXA scan has not been scheduled yet.  Recommendations/Changes made from today's visit: INCREASE BP monitoring to once weekly INCREASE activity (patient to resume walking 2-3 times weekly) Patient to schedule DEXA.  Plan: CPP Follow-up 6 months  Subjective: Ebony Mayer is an 75 y.o. year old female who is a primary patient of Nche, Charlene Brooke, NP.  The CCM team was consulted for assistance with disease management and care coordination needs.    Engaged with patient by telephone for follow up visit in response to provider referral for pharmacy case management and/or care coordination services.   Consent to Services:  The patient was given information about Chronic Care Management services, agreed to services, and gave verbal consent prior to initiation of services.  Please see initial visit note for detailed documentation.   Patient Care Team: Nche, Charlene Brooke, NP as PCP - General (Internal Medicine) Germaine Pomfret, [facility] as Pharmacist (Pharmacist)  Recent office visits: 01/08/21: Patient presented to Wilfred Lacy, NP for follow-up. Dizziness resolved.   Recent consult visits: None in past 6 months   Hospital visits: 12/31/20: Patient presented to ED for dizziness.    Objective:  Lab Results  Component Value Date   CREATININE 0.97 12/31/2020   BUN 13 12/31/2020   GFR 58.80 (L) 07/09/2020   GFRNONAA >60 12/31/2020   GFRAA >60 08/27/2018   NA 137 12/31/2020   K 4.2 12/31/2020   CALCIUM 9.6 12/31/2020   CO2 27 12/31/2020   GLUCOSE 119 (H) 12/31/2020    Lab Results  Component Value Date/Time   HGBA1C 5.2 01/07/2020 12:01 PM   HGBA1C 5.2 11/08/2018 11:01 AM    GFR 58.80 (L) 07/09/2020 10:22 AM   GFR 65.06 01/07/2020 12:01 PM    Last diabetic Eye exam: No results found for: HMDIABEYEEXA  Last diabetic Foot exam: No results found for: HMDIABFOOTEX   Lab Results  Component Value Date   CHOL 170 01/07/2020   HDL 43.10 01/07/2020   LDLCALC 106 (H) 01/07/2020   TRIG 103.0 01/07/2020   CHOLHDL 4 01/07/2020    Hepatic Function Latest Ref Rng & Units 12/31/2020 01/07/2020 11/08/2018  Total Protein 6.5 - 8.1 g/dL 7.0 6.8 7.0  Albumin 3.5 - 5.0 g/dL 4.1 4.2 4.2  AST 15 - 41 U/L _0 ALT 0 - 44 U/L _1 Alk Phosphatase 38 - 126 U/L 53 59 50  Total Bilirubin 0.3 - 1.2 mg/dL 0.4 0.6 0.6  Bilirubin, Direct 0.0 - 0.3 mg/dL - - -    Lab Results  Component Value Date/Time   TSH 1.21 07/09/2020 10:22 AM   TSH 0.69 11/08/2018 11:01 AM    CBC Latest Ref Rng & Units 12/31/2020 11/08/2018 08/27/2018  WBC 4.0 - 10.5 K/uL 4.1 3.6(L) 4.0  Hemoglobin 12.0 - 15.0 g/dL 13.9 13.0 13.6  Hematocrit 36.0 - 46.0 % 42.7 38.9 43.2  Platelets 150 - 400 K/uL 153 167.0 192   -Last DEXA Scan: 07/16/16   T-Score femoral neck: -1.9  T-Score total hip: NA  T-Score lumbar spine: +0.4  T-Score forearm radius: NA  10-year probability of major osteoporotic fracture: 5.1%  10-year probability of hip fracture: 0.9%  No results found for: [facility]  Clinical ASCVD: No  The 10-year ASCVD risk score (Arnett DK, et al., 2019) is: 10.6%   Values used to calculate the score:     Age: 61 years     Sex: Female     Is Non-Hispanic African American: Yes     Diabetic: No     Tobacco smoker: No     Systolic Blood Pressure: 119 mmHg     Is BP treated: Yes     HDL Cholesterol: 43.1 mg/dL     Total Cholesterol: 170 mg/dL    Depression screen [facility] 2/9 07/10/2020 01/07/2020 11/08/2018  Decreased Interest 0 0 0  Down, Depressed, Hopeless 0 0 0  PHQ - 2 Score 0 0 0    Social History   Tobacco Use  Smoking Status Never  Smokeless Tobacco Never   BP Readings from Last 3  Encounters:  01/08/21 120/62  12/31/20 (!) 130/107  07/09/20 128/70   Pulse Readings from Last 3 Encounters:  01/08/21 99  12/31/20 (!) 102  07/09/20 100   Wt Readings from Last 3 Encounters:  01/08/21 162 lb 9.6 oz (73.8 kg)  12/31/20 160 lb (72.6 kg)  07/10/20 160 lb (72.6 kg)   BMI Readings from Last 3 Encounters:  01/08/21 27.06 kg/m  12/31/20 26.63 kg/m  07/10/20 26.63 kg/m    Assessment/Interventions: Review of patient past medical history, allergies, medications, health status, including review of consultants reports, laboratory and other test data, was performed as part of comprehensive evaluation and provision of chronic care management services.   SDOH:  (Social Determinants of Health) assessments and interventions performed: Yes SDOH Interventions    Flowsheet Row Most Recent Value  SDOH Interventions   Financial Strain Interventions Intervention Not Indicated      SDOH Screenings   Alcohol Screen: Low Risk    Last Alcohol Screening Score (AUDIT): 0  Depression (PHQ2-9): Low Risk    PHQ-2 Score: 0  Financial Resource Strain: Low Risk    Difficulty of Paying Living Expenses: Not hard at all  Food Insecurity: No Food Insecurity   Worried About Charity fundraiser in the Last Year: Never true   Ran Out of Food in the Last Year: Never true  Housing: Low Risk    Last Housing Risk Score: 0  Physical Activity: Inactive   Days of Exercise per Week: 0 days   Minutes of Exercise per Session: 0 min  Social Connections: Moderately Isolated   Frequency of Communication with Friends and Family: More than three times a week   Frequency of Social Gatherings with Friends and Family: Once a week   Attends Religious Services: 1 to 4 times per year   Active Member of [employer] or Organizations: No   Attends Music therapist: Never   Marital Status: Never married  Stress: No Stress Concern Present   Feeling of Stress : Not at all  Tobacco Use: Low Risk     Smoking Tobacco Use: Never   Smokeless Tobacco Use: Never  Transportation Needs: No Transportation Needs   Lack of Transportation (Medical): No   Lack of Transportation (Non-Medical): No    CCM Care Plan  No Known Allergies  Medications Reviewed Today     Reviewed by Flossie Buffy, NP (Nurse Practitioner) on 01/08/21 at 1227  Med List Status: <None>   Medication Order Taking? Sig Documenting Provider Last Dose Status Informant  amLODipine (NORVASC) 5 MG tablet [id number]  TAKE ONE TABLET BY MOUTH EVERYDAY AT BEDTIME Nche, Baldo Ash  Lum, NP  Active   aspirin EC 81 MG tablet [id number] Yes Take 81 mg by mouth daily. Swallow whole. [provider] Taking Active   Calcium Carbonate-Vitamin D 600-400 MG-UNIT tablet [id number] Yes Take 1 tablet by mouth daily. [provider] Taking Active   diclofenac Sodium (VOLTAREN) 1 % GEL [id number] Yes Apply 2 g topically in the morning, at noon, and at bedtime. Flossie Buffy, NP Taking Active   gabapentin (NEURONTIN) 300 MG capsule [id number] Yes TAKE ONE CAPSULE BY MOUTH EVERYDAY AT BEDTIME Nche, Charlene Brooke, NP Taking Active   hydrochlorothiazide (HYDRODIURIL) 25 MG tablet [id number]  Take 1 tablet (25 mg total) by mouth every morning. Nche, Charlene Brooke, NP  Active   potassium chloride SA (KLOR-CON) 20 MEQ tablet [id number]  Take 1 tablet (20 mEq total) by mouth every morning. Flossie Buffy, NP  Active             Patient Active Problem List   Diagnosis Date Noted   Mixed hyperlipidemia 10/19/2017   Hyperglycemia 10/19/2017   Patellofemoral arthritis of left knee 07/08/2016   It band syndrome, left 06/15/2016   Essential hypertension, benign 05/31/2016   Leg pain, lateral 05/31/2016   Varicose veins of left lower extremity with pain 05/31/2016   Onychomycosis 10/10/2013   Porokeratosis 10/10/2013    Immunization History  Administered Date(s) Administered   PFIZER(Purple Top)SARS-COV-2 Vaccination  12/20/2019, 01/14/2020   Pneumococcal Conjugate-13 07/09/2020    Conditions to be addressed/monitored:  Hypertension, Hyperlipidemia, and Osteopenia  Care Plan : General Pharmacy (Adult)  Updates made by Germaine Pomfret, [place] since 06/01/2021 12:00 AM     Problem: Hypertension, Hyperlipidemia, and Osteopenia   Priority: High     Long-Range Goal: Patient-Specific Goal   Start Date: 06/01/2021  Expected End Date: 06/01/2022  This Visit's Progress: On track  Priority: High  Note:   Current Barriers:  No barriers noted  Pharmacist Clinical Goal(s):  Patient will maintain control of blood pressure as evidenced by BP less than 140/90  through collaboration with PharmD and provider.   Interventions: 1:1 collaboration with Nche, Charlene Brooke, NP regarding development and update of comprehensive plan of care as evidenced by provider attestation and co-signature Inter-disciplinary care team collaboration (see longitudinal plan of care) Comprehensive medication review performed; medication list updated in electronic medical record  Hypertension (BP goal <140/90) -Controlled -Current treatment: Amlodipine 5 mg nightly  Hydrochlorothiazide 25 mg daily  -Medications previously tried: NA  -Current home readings: Has blood pressure monitor, but has not set it up yet -Current dietary habits: Oatmeal or Raisin Bran for breakfast. Lunch Greens + Fish OR  -Current exercise habits: Active in the house.  -Denies hypotensive/hypertensive symptoms -Educated on Exercise goal of 150 minutes per week; -Counseled to monitor BP at home 2-3 times weekly, document, and provide log at future appointments -Recommended to continue current medication  Hyperlipidemia: (LDL goal < 100) -Not ideally controlled -Current treatment: None -Medications previously tried: NA  -Educated on Importance of limiting foods high in cholesterol; -Counseled on diet and exercise extensively  Osteopenia (Goal Prevent  bone fractures) -Controlled -Patient is not a candidate for pharmacologic treatment -Current treatment  Calcium + D3 1 tablet daily  -Medications previously tried: NA  -Recommend weight-bearing and muscle strengthening exercises for building and maintaining bone density. -Reschedule DEXA -Recommended to continue current medication  Patient Goals/Self-Care Activities Patient will:  - check blood pressure weekly, document, and provide at future appointments  Follow Up Plan: Telephone follow  up appointment with care management team member scheduled for:  11/30/2021 at 8:30 AM      Medication Assistance: None required.  Patient affirms current coverage meets needs.  Compliance/Adherence/Medication fill history: Care Gaps: AWV - scheduled 07/14/21   Star-Rating Drugs: None  Patient's preferred pharmacy is:  [facility] - [place], [place] - [address] [place] [address] Phone: [phone] Fax: [phone]  Patient decided to: Utilize [facility] for medication synchronization, packaging and delivery  Care Plan and Follow Up Patient Decision:  Patient agrees to Care Plan and Follow-up.  Plan: Telephone follow up appointment with care management team member scheduled for:  11/30/2021 at 8:30 AM  Junius Argyle, PharmD, Para March, CPP Clinical Pharmacist [facility] at [facility]  [phone]

## 2021-06-09 ENCOUNTER — Telehealth: Payer: Self-pay

## 2021-06-09 NOTE — Progress Notes (Signed)
    Chronic Care Management Pharmacy Assistant   Name: Ebony Mayer  MRN: [id number] DOB: 1945-10-03  Reason for Encounter: Medication Review/Medication Coordination Call.   Recent office visits:  No recent Office Visit  Recent consult visits:  No recent Consult Visit  Hospital visits:  None in previous 6 months  Medications: Outpatient Encounter Medications as of 06/09/2021  Medication Sig   amLODipine (NORVASC) 5 MG tablet TAKE ONE TABLET BY MOUTH EVERYDAY AT BEDTIME   aspirin EC 81 MG tablet Take 81 mg by mouth daily. Swallow whole.   Calcium Carbonate-Vitamin D 600-400 MG-UNIT tablet Take 1 tablet by mouth daily.   diclofenac Sodium (VOLTAREN) 1 % GEL apply TWO grams TO THE AFFECTED AREA(S) EVERY MORNING, AT NOON, AT bedtime   gabapentin (NEURONTIN) 300 MG capsule TAKE ONE CAPSULE BY MOUTH EVERYDAY AT BEDTIME   hydrochlorothiazide (HYDRODIURIL) 25 MG tablet Take 1 tablet (25 mg total) by mouth every morning.   potassium chloride SA (KLOR-CON) 20 MEQ tablet Take 1 tablet (20 mEq total) by mouth every morning.   No facility-administered encounter medications on file as of 06/09/2021.    Care Gaps: Shingrix Vaccine COVID-19 Vaccine Influenza Vaccine Fecal DNA Star Rating Drugs: None ID Medication Fill Gaps: None ID  Reviewed chart for medication changes ahead of medication coordination call.   BP Readings from Last 3 Encounters:  01/08/21 120/62  12/31/20 (!) 130/107  07/09/20 128/70    Lab Results  Component Value Date   HGBA1C 5.2 01/07/2020     Patient obtains medications through Adherence Packaging  30 Days   Last adherence delivery included: Gabapentin 300 mg daily - Bedtime   Amlodipine 5 mg one tablet daily - Bedtime   Hydrochlorothiazide 25 mg one tablet daily - Breakfast   Potassium Cl ER 20 Meq one tablet daily - Breakfast   Aspirin 81 mg  one tablet daily- Vial   Calcium Carbonate + D 600 mg twice daily- Vial  Patient declined medication last  month  Voltaren 1%  Gel PRN Use  Patient is due for next adherence delivery on: 06/19/2021. Called patient and reviewed medications and coordinated delivery.  This delivery to include: Gabapentin 300 mg daily - Bedtime   Amlodipine 5 mg one tablet daily - Bedtime   Hydrochlorothiazide 25 mg one tablet daily - Breakfast   Potassium Cl ER 20 Meq one tablet daily - Breakfast   Aspirin 81 mg  one tablet daily- Vial   Calcium Carbonate + D 600 mg twice daily- Vial   Patient declined the following medications  Voltaren 1%  Gel PRN Use  Patient needs refills for None ID.  Confirmed delivery date of 06/19/2021, advised patient that pharmacy will contact them the morning of delivery.  Everlean Cherry Clinical Pharmacist Assistant [phone]

## 2021-06-10 NOTE — Addendum Note (Signed)
Addended by: Julious Payer A on: 06/10/2021 01:46 PM   Modules accepted: Orders

## 2021-06-12 DIAGNOSIS — E782 Mixed hyperlipidemia: Secondary | ICD-10-CM | POA: Diagnosis not present

## 2021-06-12 DIAGNOSIS — I10 Essential (primary) hypertension: Secondary | ICD-10-CM

## 2021-07-07 ENCOUNTER — Telehealth: Payer: Self-pay | Admitting: Nurse Practitioner

## 2021-07-08 ENCOUNTER — Other Ambulatory Visit: Payer: Self-pay

## 2021-07-09 ENCOUNTER — Telehealth: Payer: Self-pay

## 2021-07-09 DIAGNOSIS — I10 Essential (primary) hypertension: Secondary | ICD-10-CM

## 2021-07-09 DIAGNOSIS — E876 Hypokalemia: Secondary | ICD-10-CM

## 2021-07-09 DIAGNOSIS — T502X5A Adverse effect of carbonic-anhydrase inhibitors, benzothiadiazides and other diuretics, initial encounter: Secondary | ICD-10-CM

## 2021-07-09 NOTE — Telephone Encounter (Signed)
Pt states she took a home COVID test yesterday and it was negative but she has a productive cough and congestion x3 days. Pt informed she would need a virtual appointment and states she would like a phone appointment instead because she does not have a smart phone or computer she could access. Provider notified.

## 2021-07-09 NOTE — Progress Notes (Signed)
    Chronic Care Management Pharmacy Assistant   Name: Jensyn Shave  MRN: [id number] DOB: Jul 01, 1946  Reason for Encounter: Medication Review/Medication Coordination Call.   Recent office visits:  No recent office visit   Recent consult visits:  No recent consult visit  Hospital visits:  None in previous 6 months  Medications: Outpatient Encounter Medications as of 07/09/2021  Medication Sig   amLODipine (NORVASC) 5 MG tablet TAKE ONE TABLET BY MOUTH EVERYDAY AT BEDTIME   aspirin EC 81 MG tablet Take 81 mg by mouth daily. Swallow whole.   calcium carbonate (OSCAL) 1500 (600 Ca) MG TABS tablet Take 600 mg of elemental calcium by mouth daily with breakfast.   diclofenac Sodium (VOLTAREN) 1 % GEL apply TWO grams TO THE AFFECTED AREA(S) EVERY MORNING, AT NOON, AT bedtime   gabapentin (NEURONTIN) 300 MG capsule TAKE ONE CAPSULE BY MOUTH EVERYDAY AT BEDTIME   hydrochlorothiazide (HYDRODIURIL) 25 MG tablet Take 1 tablet (25 mg total) by mouth every morning.   potassium chloride SA (KLOR-CON) 20 MEQ tablet Take 1 tablet (20 mEq total) by mouth every morning.   No facility-administered encounter medications on file as of 07/09/2021.    Care Gaps: Shingrix Vaccine COVID-19 Vaccine (3 - Booster Pfizer series) Influenza Vaccine Fecal DNA (Last Completed 05/17/2018) Pneumonia Vaccine (Last Completed 07/09/2020) Star Rating Drugs: None ID Medication Fill Gaps: None ID  Reviewed chart for medication changes ahead of medication coordination call.  BP Readings from Last 3 Encounters:  01/08/21 120/62  12/31/20 (!) 130/107  07/09/20 128/70    Lab Results  Component Value Date   HGBA1C 5.2 01/07/2020     Patient obtains medications through Adherence Packaging  30 Days   Last adherence delivery included:  Gabapentin 300 mg daily - Bedtime Amlodipine 5 mg one tablet daily - Bedtime Hydrochlorothiazide 25 mg one tablet daily - Breakfast Potassium Cl ER 20 Meq one tablet daily -  Breakfast   Aspirin 81 mg  one tablet daily- Vial   Calcium Carbonate + D 600 mg twice daily- Vial  Patient declined medication last month: Voltaren 1%  Gel PRN Use  Patient is due for next adherence delivery on: 07/21/2021. Called patient and reviewed medications and coordinated delivery.  This delivery to include: Gabapentin 300 mg daily - Bedtime Amlodipine 5 mg one tablet daily - Bedtime Hydrochlorothiazide 25 mg one tablet daily - Breakfast Potassium Cl ER 20 Meq one tablet daily - Breakfast   Aspirin 81 mg  one tablet daily- Vial   Calcium Carbonate + D 600 mg twice daily- Vial  Patient declined the following medications  Voltaren 1%  Gel PRN Use  Patient needs refills for Hydrochlorothiazide,Potassium .  Confirmed delivery date of 07/21/2021, advised patient that pharmacy will contact them the morning of delivery.  Telephone follow up appointment with Care management team member scheduled for : 11/30/2021 at 8:30 am.  Patient states she is not feeling well today, and she has a appointment tomorrow 07/10/2021 to be seen.Patient is aware to return my call if she needs any acute fills.  Everlean Cherry Clinical Pharmacist Assistant [phone]

## 2021-07-10 ENCOUNTER — Ambulatory Visit: Payer: Medicare HMO | Admitting: Nurse Practitioner

## 2021-07-10 MED ORDER — POTASSIUM CHLORIDE CRYS ER 20 MEQ PO TBCR: 20.0000 meq | EXTENDED_RELEASE_TABLET | Freq: Every morning | ORAL | 1 refills | Status: DC

## 2021-07-10 MED ORDER — HYDROCHLOROTHIAZIDE 25 MG PO TABS: 25.0000 mg | ORAL_TABLET | Freq: Every morning | ORAL | 1 refills | Status: DC

## 2021-07-10 NOTE — Addendum Note (Signed)
Addended by: Julious Payer A on: 07/10/2021 10:09 AM   Modules accepted: Orders

## 2021-07-14 ENCOUNTER — Ambulatory Visit: Payer: Medicare HMO

## 2021-07-22 ENCOUNTER — Ambulatory Visit (INDEPENDENT_AMBULATORY_CARE_PROVIDER_SITE_OTHER): Payer: Medicare HMO

## 2021-07-22 DIAGNOSIS — Z780 Asymptomatic menopausal state: Secondary | ICD-10-CM | POA: Diagnosis not present

## 2021-07-22 DIAGNOSIS — Z0000 Encounter for general adult medical examination without abnormal findings: Secondary | ICD-10-CM | POA: Diagnosis not present

## 2021-07-22 NOTE — Patient Instructions (Signed)
[name] , Thank you for taking time to come for your Medicare Wellness Visit. I appreciate your ongoing commitment to your health goals. Please review the following plan we discussed and let me know if I can assist you in the future.   Screening recommendations/referrals: Colonoscopy: patient declines referral  Mammogram: 12/22/2020 Bone Density: patient has current referral  Recommended yearly ophthalmology/optometry visit for glaucoma screening and checkup Recommended yearly dental visit for hygiene and checkup  Vaccinations: Influenza vaccine: will obtain at pharmacy  Pneumococcal vaccine: completed  Tdap vaccine: due with injury  Shingles vaccine: will consider none     Advanced directives: none   Conditions/risks identified: none   Next appointment: 09/11/2021   1000 Ebony Nche,  NP   Preventive Care 65 Years and Older, Female Preventive care refers to lifestyle choices and visits with your health care provider that can promote health and wellness. What does preventive care include? A yearly physical exam. This is also called an annual well check. Dental exams once or twice a year. Routine eye exams. Ask your health care provider how often you should have your eyes checked. Personal lifestyle choices, including: Daily care of your teeth and gums. Regular physical activity. Eating a healthy diet. Avoiding tobacco and drug use. Limiting alcohol use. Practicing safe sex. Taking low-dose aspirin every day. Taking vitamin and mineral supplements as recommended by your health care provider. What happens during an annual well check? The services and screenings done by your health care provider during your annual well check will depend on your age, overall health, lifestyle risk factors, and family history of disease. Counseling  Your health care provider may ask you questions about your: Alcohol use. Tobacco use. Drug use. Emotional well-being. Home and  relationship well-being. Sexual activity. Eating habits. History of falls. Memory and ability to understand (cognition). Work and work Astronomer. Reproductive health. Screening  You may have the following tests or measurements: Height, weight, and BMI. Blood pressure. Lipid and cholesterol levels. These may be checked every 5 years, or more frequently if you are over 68 years old. Skin check. Lung cancer screening. You may have this screening every year starting at age 64 if you have a 30-pack-year history of smoking and currently smoke or have quit within the past 15 years. Fecal occult blood test (FOBT) of the stool. You may have this test every year starting at age 31. Flexible sigmoidoscopy or colonoscopy. You may have a sigmoidoscopy every 5 years or a colonoscopy every 10 years starting at age 14. Hepatitis C blood test. Hepatitis B blood test. Sexually transmitted disease (STD) testing. Diabetes screening. This is done by checking your blood sugar (glucose) after you have not eaten for a while (fasting). You may have this done every 1-3 years. Bone density scan. This is done to screen for osteoporosis. You may have this done starting at age 83. Mammogram. This may be done every 1-2 years. Talk to your health care provider about how often you should have regular mammograms. Talk with your health care provider about your test results, treatment options, and if necessary, the need for more tests. Vaccines  Your health care provider may recommend certain vaccines, such as: Influenza vaccine. This is recommended every year. Tetanus, diphtheria, and acellular pertussis (Tdap, Td) vaccine. You may need a Td booster every 10 years. Zoster vaccine. You may need this after age 46. Pneumococcal 13-valent conjugate (PCV13) vaccine. One dose is recommended after age 23. Pneumococcal polysaccharide (PPSV23) vaccine. One dose is  recommended after age 38. Talk to your health care provider  about which screenings and vaccines you need and how often you need them. This information is not intended to replace advice given to you by your health care provider. Make sure you discuss any questions you have with your health care provider. Document Released: 09/26/2015 Document Revised: 05/19/2016 Document Reviewed: 07/01/2015 Elsevier Interactive Patient Education  2017 [place] Prevention in the Home Falls can cause injuries. They can happen to people of all ages. There are many things you can do to make your home safe and to help prevent falls. What can I do on the outside of my home? Regularly fix the edges of walkways and driveways and fix any cracks. Remove anything that might make you trip as you walk through a door, such as a raised step or threshold. Trim any bushes or trees on the path to your home. Use bright outdoor lighting. Clear any walking paths of anything that might make someone trip, such as rocks or tools. Regularly check to see if handrails are loose or broken. Make sure that both sides of any steps have handrails. Any raised decks and porches should have guardrails on the edges. Have any leaves, snow, or ice cleared regularly. Use sand or salt on walking paths during winter. Clean up any spills in your garage right away. This includes oil or grease spills. What can I do in the bathroom? Use night lights. Install grab bars by the toilet and in the tub and shower. Do not use towel bars as grab bars. Use non-skid mats or decals in the tub or shower. If you need to sit down in the shower, use a plastic, non-slip stool. Keep the floor dry. Clean up any water that spills on the floor as soon as it happens. Remove soap buildup in the tub or shower regularly. Attach bath mats securely with double-sided non-slip rug tape. Do not have throw rugs and other things on the floor that can make you trip. What can I do in the bedroom? Use night lights. Make sure  that you have a light by your bed that is easy to reach. Do not use any sheets or blankets that are too big for your bed. They should not hang down onto the floor. Have a firm chair that has side arms. You can use this for support while you get dressed. Do not have throw rugs and other things on the floor that can make you trip. What can I do in the kitchen? Clean up any spills right away. Avoid walking on wet floors. Keep items that you use a lot in easy-to-reach places. If you need to reach something above you, use a strong step stool that has a grab bar. Keep electrical cords out of the way. Do not use floor polish or wax that makes floors slippery. If you must use wax, use non-skid floor wax. Do not have throw rugs and other things on the floor that can make you trip. What can I do with my stairs? Do not leave any items on the stairs. Make sure that there are handrails on both sides of the stairs and use them. Fix handrails that are broken or loose. Make sure that handrails are as long as the stairways. Check any carpeting to make sure that it is firmly attached to the stairs. Fix any carpet that is loose or worn. Avoid having throw rugs at the top or bottom of the stairs. If  you do have throw rugs, attach them to the floor with carpet tape. Make sure that you have a light switch at the top of the stairs and the bottom of the stairs. If you do not have them, ask someone to add them for you. What else can I do to help prevent falls? Wear shoes that: Do not have high heels. Have rubber bottoms. Are comfortable and fit you well. Are closed at the toe. Do not wear sandals. If you use a stepladder: Make sure that it is fully opened. Do not climb a closed stepladder. Make sure that both sides of the stepladder are locked into place. Ask someone to hold it for you, if possible. Clearly mark and make sure that you can see: Any grab bars or handrails. First and last steps. Where the edge of  each step is. Use tools that help you move around (mobility aids) if they are needed. These include: Canes. Walkers. Scooters. Crutches. Turn on the lights when you go into a dark area. Replace any light bulbs as soon as they burn out. Set up your furniture so you have a clear path. Avoid moving your furniture around. If any of your floors are uneven, fix them. If there are any pets around you, be aware of where they are. Review your medicines with your doctor. Some medicines can make you feel dizzy. This can increase your chance of falling. Ask your doctor what other things that you can do to help prevent falls. This information is not intended to replace advice given to you by your health care provider. Make sure you discuss any questions you have with your health care provider. Document Released: 06/26/2009 Document Revised: 02/05/2016 Document Reviewed: 10/04/2014 Elsevier Interactive Patient Education  2017 [employer].

## 2021-07-22 NOTE — Progress Notes (Signed)
Subjective:   Ebony Mayer is a 75 y.o. female who presents for Medicare Annual (Subsequent) preventive examination.   I connected with Ebony Mayer today by telephone and verified that I am speaking with the correct person using two identifiers. Location patient: home Location provider: work Persons participating in the virtual visit: patient, provider.   I discussed the limitations, risks, security and privacy concerns of performing an evaluation and management service by telephone and the availability of in person appointments. I also discussed with the patient that there may be a patient responsible charge related to this service. The patient expressed understanding and verbally consented to this telephonic visit.    Interactive audio and video telecommunications were attempted between this provider and patient, however failed, due to patient having technical difficulties OR patient did not have access to video capability.  We continued and completed visit with audio only.    Review of Systems     Cardiac Risk Factors include: advanced age (>17men, >54 women);dyslipidemia;hypertension     Objective:    Today's Vitals   There is no Mayer or weight on file to calculate BMI.  Advanced Directives 07/22/2021 12/31/2020 07/10/2020 11/08/2018 09/01/2018 08/27/2018 05/25/2016  Does Patient Have a Medical Advance Directive? Yes No No No No No No  Does patient want to make changes to medical advance directive? No - Patient declined - - - - - -  Would patient like information on creating a medical advance directive? - - No - Patient declined No - Patient declined No - Patient declined No - Patient declined No - patient declined information    Current Medications (verified) Outpatient Encounter Medications as of 07/22/2021  Medication Sig   amLODipine (NORVASC) 5 MG tablet TAKE ONE TABLET BY MOUTH EVERYDAY AT BEDTIME   aspirin EC 81 MG tablet Take 81 mg by mouth daily. Swallow whole.    calcium carbonate (OSCAL) 1500 (600 Ca) MG TABS tablet Take 600 mg of elemental calcium by mouth daily with breakfast.   diclofenac Sodium (VOLTAREN) 1 % GEL apply TWO grams TO THE AFFECTED AREA(S) EVERY MORNING, AT NOON, AT bedtime   gabapentin (NEURONTIN) 300 MG capsule TAKE ONE CAPSULE BY MOUTH EVERYDAY AT BEDTIME   hydrochlorothiazide (HYDRODIURIL) 25 MG tablet Take 1 tablet (25 mg total) by mouth every morning.   potassium chloride SA (KLOR-CON) 20 MEQ tablet Take 1 tablet (20 mEq total) by mouth every morning.   No facility-administered encounter medications on file as of 07/22/2021.    Allergies (verified) Patient has no known allergies.   History: Past Medical History:  Diagnosis Date   Hypertension    Hypertension    Mixed hyperlipidemia 10/19/2017   Patellofemoral arthritis of left knee 07/08/2016   History reviewed. No pertinent surgical history. Family History  Problem Relation Age of Onset   Diabetes Mother    Heart attack Mother    Cancer Sister    Heart attack Brother    Social History   Socioeconomic History   Marital status: Single    Spouse name: Not on file   Number of children: Not on file   Years of education: Not on file   Highest education level: Not on file  Occupational History   Not on file  Tobacco Use   Smoking status: Never   Smokeless tobacco: Never  Substance and Sexual Activity   Alcohol use: No   Drug use: No   Sexual activity: Not Currently  Other Topics Concern   Not on file  Social History Narrative   Not on file   Social Determinants of Health   Financial Resource Strain: Low Risk    Difficulty of Paying Living Expenses: Not hard at all  Food Insecurity: No Food Insecurity   Worried About Programme researcher, broadcasting/film/video in the Last Year: Never true   Barista in the Last Year: Never true  Transportation Needs: No Transportation Needs   Lack of Transportation (Medical): No   Lack of Transportation (Non-Medical): No  Physical  Activity: Inactive   Days of Exercise per Week: 0 days   Minutes of Exercise per Session: 0 min  Stress: No Stress Concern Present   Feeling of Stress : Not at all  Social Connections: Moderately Isolated   Frequency of Communication with Friends and Family: Twice a week   Frequency of Social Gatherings with Friends and Family: Twice a week   Attends Religious Services: 1 to 4 times per year   Active Member of [employer] or Organizations: No   Attends Engineer, structural: Never   Marital Status: Never married    Tobacco Counseling Counseling given: Not Answered   Clinical Intake:  Pre-visit preparation completed: Yes  Pain : No/denies pain     Nutritional Risks: None Diabetes: No  How often do you need to have someone help you when you read instructions, pamphlets, or other written materials from your doctor or pharmacy?: 1 - Never What is the last grade level you completed in school?: 9 th grade  Diabetic?no   Interpreter Needed?: No  Information entered by :: L.Burnett Spray,LPN   Activities of Daily Living In your present state of health, do you have any difficulty performing the following activities: 07/22/2021  Hearing? N  Vision? N  Difficulty concentrating or making decisions? N  Walking or climbing stairs? N  Dressing or bathing? N  Doing errands, shopping? N  Preparing Food and eating ? N  Using the Toilet? N  In the past six months, have you accidently leaked urine? N  Do you have problems with loss of bowel control? N  Managing your Medications? N  Managing your Finances? N  Housekeeping or managing your Housekeeping? N  Some recent data might be hidden    Patient Care Team: Nche, Bonna Gains, NP as PCP - General (Internal Medicine) Gaspar Cola, [facility] as Pharmacist (Pharmacist)  Indicate any recent Medical Services you may have received from other than [facility] providers in the past year (date may be approximate).     Assessment:   This is a  routine wellness examination for Ebony Mayer.  Hearing/Vision screen Vision Screening - Comments:: Annual eye exams wears glasses   Dietary issues and exercise activities discussed: Current Exercise Habits: The patient does not participate in regular exercise at present   Goals Addressed             This Visit's Progress    Increase physical activity   On track      Depression Screen PHQ 2/9 Scores 07/22/2021 07/22/2021 07/10/2020 01/07/2020 11/08/2018 10/19/2017 05/25/2016  PHQ - 2 Score 0 0 0 0 0 0 0    Fall Risk Fall Risk  07/22/2021 01/08/2021 07/10/2020 01/07/2020 11/08/2018  Falls in the past year? 0 0 0 0 0  Number falls in past yr: 0 0 0 - -  Injury with Fall? 0 0 0 - -  Risk for fall due to : - No Fall Risks - - -  Follow up Falls evaluation completed  Falls evaluation completed Falls prevention discussed - -    FALL RISK PREVENTION PERTAINING TO THE HOME:  Any stairs in or around the home? Yes  If so, are there any without handrails? No  Home free of loose throw rugs in walkways, pet beds, electrical cords, etc? Yes  Adequate lighting in your home to reduce risk of falls? Yes   ASSISTIVE DEVICES UTILIZED TO PREVENT FALLS:  Life alert? No  Use of a cane, walker or w/c? No  Grab bars in the bathroom? Yes  Shower chair or bench in shower? Yes  Elevated toilet seat or a handicapped toilet? No   Cognitive Function: Normal cognitive status assessed by direct observation by this Nurse Health Advisor. No abnormalities found.   MMSE - Mini Mental State Exam 11/08/2018  Orientation to time 5  Orientation to Place 5  Registration 3  Attention/ Calculation 5  Attention/Calculation-comments pt does not do well with spelling or math  Recall 0  Language- name 2 objects 2  Language- repeat 1  Language- follow 3 step command 3  Language- read & follow direction 1  Write a sentence 0  Copy design 0  Total score 25        Immunizations Immunization History  Administered  Date(s) Administered   PFIZER(Purple Top)SARS-COV-2 Vaccination 12/20/2019, 01/14/2020   Pneumococcal Conjugate-13 07/09/2020    TDAP status: Due, Education has been provided regarding the importance of this vaccine. Advised may receive this vaccine at local pharmacy or Health Dept. Aware to provide a copy of the vaccination record if obtained from local pharmacy or Health Dept. Verbalized acceptance and understanding.  Flu Vaccine status: Due, Education has been provided regarding the importance of this vaccine. Advised may receive this vaccine at local pharmacy or Health Dept. Aware to provide a copy of the vaccination record if obtained from local pharmacy or Health Dept. Verbalized acceptance and understanding.  Pneumococcal vaccine status: Up to date  Covid-19 vaccine status: Completed vaccines  Qualifies for Shingles Vaccine? Yes   Zostavax completed No   Shingrix Completed?: No.    Education has been provided regarding the importance of this vaccine. Patient has been advised to call insurance company to determine out of pocket expense if they have not yet received this vaccine. Advised may also receive vaccine at local pharmacy or Health Dept. Verbalized acceptance and understanding.  Screening Tests Health Maintenance  Topic Date Due   Zoster Vaccines- Shingrix (1 of 2) Never done   COVID-19 Vaccine (3 - Booster for Pfizer series) 03/10/2020   INFLUENZA VACCINE  Never done   Fecal DNA (Cologuard)  05/17/2021   Pneumonia Vaccine 21+ Years old (2 - PPSV23 if available, else PCV20) 07/09/2021   TETANUS/TDAP  01/08/2022 (Originally 04/08/1965)   MAMMOGRAM  12/22/2021   DEXA SCAN  Completed   Hepatitis C Screening  Completed   HPV VACCINES  Aged Out    Health Maintenance  Health Maintenance Due  Topic Date Due   Zoster Vaccines- Shingrix (1 of 2) Never done   COVID-19 Vaccine (3 - Booster for Pfizer series) 03/10/2020   INFLUENZA VACCINE  Never done   Fecal DNA (Cologuard)   05/17/2021   Pneumonia Vaccine 22+ Years old (2 - PPSV23 if available, else PCV20) 07/09/2021    Colorectal cancer screening: Referral to GI placed patient declines . Pt aware the office will call re: appt.  Mammogram status: Completed 12/22/2020. Repeat every year  Bone Density status: Ordered 07/22/2021. Pt provided with contact info  and advised to call to schedule appt.  Lung Cancer Screening: (Low Dose CT Chest recommended if Age 93-80 years, 30 pack-year currently smoking OR have quit w/in 15years.) does not qualify.   Lung Cancer Screening Referral: 05/31/2016  Additional Screening:  Hepatitis C Screening: does not qualify; Completed 05/31/2016  Vision Screening: Recommended annual ophthalmology exams for early detection of glaucoma and other disorders of the eye. Is the patient up to date with their annual eye exam?  No  Who is the provider or what is the name of the office in which the patient attends annual eye exams? Patient states she scheduled with Dr. Dione Booze  If pt is not established with a provider, would they like to be referred to a provider to establish care? No .   Dental Screening: Recommended annual dental exams for proper oral hygiene  Community Resource Referral / Chronic Care Management: CRR required this visit?  No   CCM required this visit?  No      Plan:     I have personally reviewed and noted the following in the patient's chart:   Medical and social history Use of alcohol, tobacco or illicit drugs  Current medications and supplements including opioid prescriptions.  Functional ability and status Nutritional status Physical activity Advanced directives List of other physicians Hospitalizations, surgeries, and ER visits in previous 12 months Vitals Screenings to include cognitive, depression, and falls Referrals and appointments  In addition, I have reviewed and discussed with patient certain preventive protocols, quality metrics, and best  practice recommendations. A written personalized care plan for preventive services as well as general preventive health recommendations were provided to patient.     March Rummage, LPN   [id number]   Nurse Notes: none

## 2021-08-11 ENCOUNTER — Telehealth: Payer: Self-pay

## 2021-08-11 NOTE — Progress Notes (Signed)
    Chronic Care Management Pharmacy Assistant   Name: Kalan Rinn  MRN: [id number] DOB: August 09, 1946  Reason for Encounter: Medication Review/Medication coordination call.  Recent office visits:  07/22/2021 March Rummage LPN (PCP Office) Medicare Wellness completed, No medication changes noted.  Recent consult visits:  No recent consult visit  Hospital visits:  None in previous 6 months  Medications: Outpatient Encounter Medications as of 08/11/2021  Medication Sig   amLODipine (NORVASC) 5 MG tablet TAKE ONE TABLET BY MOUTH EVERYDAY AT BEDTIME   aspirin EC 81 MG tablet Take 81 mg by mouth daily. Swallow whole.   calcium carbonate (OSCAL) 1500 (600 Ca) MG TABS tablet Take 600 mg of elemental calcium by mouth daily with breakfast.   diclofenac Sodium (VOLTAREN) 1 % GEL apply TWO grams TO THE AFFECTED AREA(S) EVERY MORNING, AT NOON, AT bedtime   gabapentin (NEURONTIN) 300 MG capsule TAKE ONE CAPSULE BY MOUTH EVERYDAY AT BEDTIME   hydrochlorothiazide (HYDRODIURIL) 25 MG tablet Take 1 tablet (25 mg total) by mouth every morning.   potassium chloride SA (KLOR-CON) 20 MEQ tablet Take 1 tablet (20 mEq total) by mouth every morning.   No facility-administered encounter medications on file as of 08/11/2021.    Care Gaps: Shingrix Vaccine COVID-19 Vaccine (3 - Booster Pfizer series) Influenza Vaccine Fecal DNA (Last Completed 05/17/2018) Pneumonia Vaccine (Last Completed 07/09/2020) Star Rating Drugs: None ID Medication Fill Gaps: None ID  Reviewed chart for medication changes ahead of medication coordination call.   BP Readings from Last 3 Encounters:  01/08/21 120/62  12/31/20 (!) 130/107  07/09/20 128/70    Lab Results  Component Value Date   HGBA1C 5.2 01/07/2020     Patient obtains medications through Adherence Packaging  30 Days   Last adherence delivery included: Gabapentin 300 mg daily - Bedtime Amlodipine 5 mg one tablet daily - Bedtime Hydrochlorothiazide 25  mg one tablet daily - Breakfast Potassium Cl ER 20 Meq one tablet daily - Breakfast Aspirin 81 mg  one tablet daily- Vial Calcium Carbonate + D 600 mg twice daily- Vial  Patient declined medications last month:  Voltaren 1%  Gel PRN Use  Patient is due for next adherence delivery on: 08/19/2021. Called patient and reviewed medications and coordinated delivery.  This delivery to include: Gabapentin 300 mg daily - Bedtime Amlodipine 5 mg one tablet daily - Bedtime Hydrochlorothiazide 25 mg one tablet daily - Breakfast Potassium Cl ER 20 Meq one tablet daily - Breakfast Aspirin 81 mg  one tablet daily- Vial Calcium Carbonate + D 600 mg twice daily- Vial  Patient declined the following medications Voltaren 1%  Gel PRN Use  Patient needs refills for None .  Confirmed delivery date of 08/19/2021 ([facility]), advised patient that pharmacy will contact them the morning of delivery.  Telephone follow up appointment with Care management team member scheduled for : 11/30/2021 at 8:30 am.  Everlean Cherry Clinical Pharmacist Assistant [phone]

## 2021-09-09 ENCOUNTER — Telehealth: Payer: Self-pay

## 2021-09-09 NOTE — Progress Notes (Signed)
° ° °  Chronic Care Management Pharmacy Assistant   Name: Ebony Mayer  MRN: [id number] DOB: [birth year]  Reason for Encounter: Medication Review/Medication Coordination Call.   Recent office visits:  No recent office visit  Recent consult visits:  No recent consult visit  Hospital visits:  None in previous 6 months  Medications: Outpatient Encounter Medications as of 09/09/2021  Medication Sig   amLODipine (NORVASC) 5 MG tablet TAKE ONE TABLET BY MOUTH EVERYDAY AT BEDTIME   aspirin EC 81 MG tablet Take 81 mg by mouth daily. Swallow whole.   calcium carbonate (OSCAL) 1500 (600 Ca) MG TABS tablet Take 600 mg of elemental calcium by mouth daily with breakfast.   diclofenac Sodium (VOLTAREN) 1 % GEL apply TWO grams TO THE AFFECTED AREA(S) EVERY MORNING, AT NOON, AT bedtime   gabapentin (NEURONTIN) 300 MG capsule TAKE ONE CAPSULE BY MOUTH EVERYDAY AT BEDTIME   hydrochlorothiazide (HYDRODIURIL) 25 MG tablet Take 1 tablet (25 mg total) by mouth every morning.   potassium chloride SA (KLOR-CON) 20 MEQ tablet Take 1 tablet (20 mEq total) by mouth every morning.   No facility-administered encounter medications on file as of 09/09/2021.    Care Gaps: Shingrix Vaccine COVID-19 Vaccine (3 - Booster Pfizer series) Influenza Vaccine Fecal DNA (Last Completed 05/17/2018) Pneumonia Vaccine (Last Completed 07/09/2020) Star Rating Drugs: None ID Medication Fill Gaps: None ID  Reviewed chart for medication changes ahead of medication coordination call.  No OVs, Consults, or hospital visits since last care coordination call/Pharmacist visit. (If appropriate, list visit date, provider name)  No medication changes indicated OR if recent visit, treatment plan here.  BP Readings from Last 3 Encounters:  01/08/21 120/62  12/31/20 (!) 130/107  07/09/20 128/70    Lab Results  Component Value Date   HGBA1C 5.2 01/07/2020     Patient obtains medications through Adherence Packaging  30 Days    Last adherence delivery included:  Gabapentin 300 mg daily - Bedtime Amlodipine 5 mg one tablet daily - Bedtime Hydrochlorothiazide 25 mg one tablet daily - Breakfast Potassium Cl ER 20 Meq one tablet daily - Breakfast Aspirin 81 mg  one tablet daily- Vial Calcium Carbonate + D 600 mg twice daily- Vial  Patient declined medications  last month: Voltaren 1%  Gel PRN Use  Patient is due for next adherence delivery on: 09/17/2021. Called patient and reviewed medications and coordinated delivery.  This delivery to include: Gabapentin 300 mg daily - Bedtime Amlodipine 5 mg one tablet daily - Bedtime Hydrochlorothiazide 25 mg one tablet daily - Breakfast Potassium Cl ER 20 Meq one tablet daily - Breakfast Aspirin 81 mg  one tablet daily- Vial Calcium Carbonate + D 600 mg twice daily- Vial Voltaren 1%  Gel PRN Use  Patient declined the following medications  None ID  Patient needs refills for None ID.  Confirmed delivery date of 09/17/2021, advised patient that pharmacy will contact them the morning of delivery.  Telephone follow up appointment with Care management team member scheduled for : 11/30/2021 at 8:30 am.  Everlean Cherry Clinical Pharmacist Assistant [phone]

## 2021-09-11 ENCOUNTER — Ambulatory Visit: Payer: Medicare HMO | Admitting: Nurse Practitioner

## 2021-09-18 ENCOUNTER — Ambulatory Visit (INDEPENDENT_AMBULATORY_CARE_PROVIDER_SITE_OTHER): Payer: Medicare HMO | Admitting: Nurse Practitioner

## 2021-09-18 ENCOUNTER — Encounter: Payer: Self-pay | Admitting: Nurse Practitioner

## 2021-09-18 ENCOUNTER — Other Ambulatory Visit: Payer: Self-pay

## 2021-09-18 ENCOUNTER — Telehealth: Payer: Self-pay

## 2021-09-18 VITALS — BP 132/74 | HR 100 | Temp 98.1°F | Ht 65.0 in | Wt 162.4 lb

## 2021-09-18 DIAGNOSIS — R739 Hyperglycemia, unspecified: Secondary | ICD-10-CM | POA: Diagnosis not present

## 2021-09-18 DIAGNOSIS — M79605 Pain in left leg: Secondary | ICD-10-CM | POA: Diagnosis not present

## 2021-09-18 DIAGNOSIS — M7632 Iliotibial band syndrome, left leg: Secondary | ICD-10-CM

## 2021-09-18 DIAGNOSIS — E876 Hypokalemia: Secondary | ICD-10-CM

## 2021-09-18 DIAGNOSIS — T502X5A Adverse effect of carbonic-anhydrase inhibitors, benzothiadiazides and other diuretics, initial encounter: Secondary | ICD-10-CM | POA: Diagnosis not present

## 2021-09-18 DIAGNOSIS — I10 Essential (primary) hypertension: Secondary | ICD-10-CM | POA: Diagnosis not present

## 2021-09-18 DIAGNOSIS — E782 Mixed hyperlipidemia: Secondary | ICD-10-CM | POA: Diagnosis not present

## 2021-09-18 DIAGNOSIS — Z1211 Encounter for screening for malignant neoplasm of colon: Secondary | ICD-10-CM | POA: Diagnosis not present

## 2021-09-18 LAB — BASIC METABOLIC PANEL
BUN: 14 mg/dL (ref 6–23)
CO2: 33 mEq/L — ABNORMAL HIGH (ref 19–32)
Calcium: 9.2 mg/dL (ref 8.4–10.5)
Chloride: 102 mEq/L (ref 96–112)
Creatinine, Ser: 1.04 mg/dL (ref 0.40–1.20)
GFR: 52.97 mL/min — ABNORMAL LOW (ref >60.00)
Glucose, Bld: 98 mg/dL (ref 70–99)
Potassium: 3.8 mEq/L (ref 3.5–5.1)
Sodium: 139 mEq/L (ref 135–145)

## 2021-09-18 LAB — LIPID PANEL
Cholesterol: 164 mg/dL (ref 0–200)
HDL: 43.8 mg/dL (ref >39.00)
LDL Cholesterol: 99 mg/dL (ref 0–99)
NonHDL: 120.59
Total CHOL/HDL Ratio: 4
Triglycerides: 106 mg/dL (ref 0.0–149.0)
VLDL: 21.2 mg/dL (ref 0.0–40.0)

## 2021-09-18 LAB — HEMOGLOBIN A1C: Hgb A1c MFr Bld: 5.4 % (ref 4.6–6.5)

## 2021-09-18 MED ORDER — DICLOFENAC SODIUM 1 % EX GEL: CUTANEOUS | 3 refills | Status: DC

## 2021-09-18 MED ORDER — GABAPENTIN 300 MG PO CAPS: ORAL_CAPSULE | ORAL | 3 refills | Status: DC

## 2021-09-18 NOTE — Progress Notes (Signed)
Subjective:  Patient ID: Ebony Mayer, female    DOB: [birth year]  Age: 76 y.o. MRN: [id number]  CC: Follow-up (6 month f/u on HTN and cholesterol. /Pt is fasting.)   HPI  She agreed to repeat cologuard Denies any change in GI function.  Essential hypertension, benign BP at goal with amlodipine and HCTZ Supplemental potassium also used. BP Readings from Last 3 Encounters:  09/18/21 132/74  01/08/21 120/62  12/31/20 (!) 130/107   Repeat BMP Maintain current med doses F/up in 3-73months  Hyperglycemia Repeat hgbA1c: 5.4%  Mixed hyperlipidemia Repeat lipid panel today  Leg pain, lateral Stable to voltaren gel and gabapentin Refill sent  Wt Readings from Last 3 Encounters:  09/18/21 162 lb 6.4 oz (73.7 kg)  01/08/21 162 lb 9.6 oz (73.8 kg)  12/31/20 160 lb (72.6 kg)    Reviewed past Medical, Social and Family history today.  Outpatient Medications Prior to Visit  Medication Sig Dispense Refill   aspirin EC 81 MG tablet Take 81 mg by mouth daily. Swallow whole.     calcium carbonate (OSCAL) 1500 (600 Ca) MG TABS tablet Take 600 mg of elemental calcium by mouth daily with breakfast.     amLODipine (NORVASC) 5 MG tablet TAKE ONE TABLET BY MOUTH EVERYDAY AT BEDTIME 90 tablet 3   diclofenac Sodium (VOLTAREN) 1 % GEL apply TWO grams TO THE AFFECTED AREA(S) EVERY MORNING, AT NOON, AT bedtime 100 g 0   gabapentin (NEURONTIN) 300 MG capsule TAKE ONE CAPSULE BY MOUTH EVERYDAY AT BEDTIME 90 capsule 3   hydrochlorothiazide (HYDRODIURIL) 25 MG tablet Take 1 tablet (25 mg total) by mouth every morning. 90 tablet 1   potassium chloride SA (KLOR-CON) 20 MEQ tablet Take 1 tablet (20 mEq total) by mouth every morning. 90 tablet 1   No facility-administered medications prior to visit.    ROS See HPI  Objective:  BP 132/74 (BP Location: Left Arm, Patient Position: Sitting, Cuff Size: Normal)    Pulse 100    Temp 98.1 F (36.7 C) (Temporal)    Ht 5\' 5"  (1.651 m)    Wt 162 lb 6.4 oz  (73.7 kg)    SpO2 98%    BMI 27.02 kg/m   Physical Exam Cardiovascular:     Rate and Rhythm: Normal rate and regular rhythm.     Pulses: Normal pulses.     Heart sounds: Normal heart sounds.  Pulmonary:     Effort: Pulmonary effort is normal.     Breath sounds: Normal breath sounds.  Musculoskeletal:     Right lower leg: No edema.     Left lower leg: No edema.  Neurological:     Mental Status: She is alert and oriented to person, place, and time.   Assessment & Plan:  This visit occurred during the SARS-CoV-2 public health emergency.  Safety protocols were in place, including screening questions prior to the visit, additional usage of staff PPE, and extensive cleaning of exam room while observing appropriate contact time as indicated for disinfecting solutions.   [name] was seen today for follow-up.  Diagnoses and all orders for this visit:  Colon cancer screening -     Cologuard  It band syndrome, left -     diclofenac Sodium (VOLTAREN) 1 % GEL; apply TWO grams TO THE AFFECTED AREA(S) EVERY MORNING, AT NOON, AT bedtime -     gabapentin (NEURONTIN) 300 MG capsule; TAKE ONE CAPSULE BY MOUTH EVERYDAY AT BEDTIME  Pain in lateral left lower extremity -  diclofenac Sodium (VOLTAREN) 1 % GEL; apply TWO grams TO THE AFFECTED AREA(S) EVERY MORNING, AT NOON, AT bedtime -     gabapentin (NEURONTIN) 300 MG capsule; TAKE ONE CAPSULE BY MOUTH EVERYDAY AT BEDTIME  Essential hypertension, benign -     Basic metabolic panel -     Basic metabolic panel; Future -     amLODipine (NORVASC) 5 MG tablet; TAKE ONE TABLET BY MOUTH EVERYDAY AT BEDTIME -     hydrochlorothiazide (HYDRODIURIL) 25 MG tablet; Take 1 tablet (25 mg total) by mouth every morning. -     potassium chloride SA (KLOR-CON M) 20 MEQ tablet; Take 1 tablet (20 mEq total) by mouth every morning.  Hyperglycemia -     Hemoglobin A1c -     Basic metabolic panel  Mixed hyperlipidemia -     Lipid panel  Diuretic-induced  hypokalemia -     potassium chloride SA (KLOR-CON M) 20 MEQ tablet; Take 1 tablet (20 mEq total) by mouth every morning.    Problem List Items Addressed This Visit       Cardiovascular and Mediastinum   Essential hypertension, benign    BP at goal with amlodipine and HCTZ Supplemental potassium also used. BP Readings from Last 3 Encounters:  09/18/21 132/74  01/08/21 120/62  12/31/20 (!) 130/107   Repeat BMP Maintain current med doses F/up in 3-95months      Relevant Medications   amLODipine (NORVASC) 5 MG tablet   hydrochlorothiazide (HYDRODIURIL) 25 MG tablet   potassium chloride SA (KLOR-CON M) 20 MEQ tablet   Other Relevant Orders   Basic metabolic panel (Completed)   Basic metabolic panel     Musculoskeletal and Integument   It band syndrome, left   Relevant Medications   diclofenac Sodium (VOLTAREN) 1 % GEL   gabapentin (NEURONTIN) 300 MG capsule     Other   Hyperglycemia    Repeat hgbA1c: 5.4%      Relevant Orders   Hemoglobin A1c (Completed)   Basic metabolic panel (Completed)   Leg pain, lateral    Stable to voltaren gel and gabapentin Refill sent      Relevant Medications   diclofenac Sodium (VOLTAREN) 1 % GEL   gabapentin (NEURONTIN) 300 MG capsule   Mixed hyperlipidemia    Repeat lipid panel today      Relevant Medications   amLODipine (NORVASC) 5 MG tablet   hydrochlorothiazide (HYDRODIURIL) 25 MG tablet   Other Relevant Orders   Lipid panel (Completed)   Other Visit Diagnoses     Colon cancer screening    -  Primary   Relevant Orders   Cologuard   Diuretic-induced hypokalemia       Relevant Medications   potassium chloride SA (KLOR-CON M) 20 MEQ tablet        Follow-up: Return in about 6 months (around 03/18/2022) for Hyperglycemia and HTN, hyperlipidemia (fasting).  [name], NP

## 2021-09-18 NOTE — Patient Instructions (Signed)
Cologuard kit will be sent to you  Go to lab for blood draw.  Maintain current medication doses

## 2021-09-18 NOTE — Progress Notes (Signed)
Per Clinical Pharmacist, Please move patient telephone appointment on 12/03/2021 to 11:45 am.  Telephone follow up appointment with Care management team member scheduled for : 12/03/2021 at 11:45 am.  Everlean Cherry Clinical Pharmacist Assistant [phone]

## 2021-09-20 MED ORDER — HYDROCHLOROTHIAZIDE 25 MG PO TABS: 25.0000 mg | ORAL_TABLET | Freq: Every morning | ORAL | 1 refills | Status: DC

## 2021-09-20 MED ORDER — AMLODIPINE BESYLATE 5 MG PO TABS: ORAL_TABLET | ORAL | 3 refills | Status: DC

## 2021-09-20 MED ORDER — POTASSIUM CHLORIDE CRYS ER 20 MEQ PO TBCR: 20.0000 meq | EXTENDED_RELEASE_TABLET | Freq: Every morning | ORAL | 1 refills | Status: DC

## 2021-09-20 NOTE — Assessment & Plan Note (Signed)
Repeat lipid panel today. 

## 2021-09-20 NOTE — Assessment & Plan Note (Signed)
Stable to voltaren gel and gabapentin Refill sent

## 2021-09-20 NOTE — Assessment & Plan Note (Signed)
BP at goal with amlodipine and HCTZ Supplemental potassium also used. BP Readings from Last 3 Encounters:  09/18/21 132/74  01/08/21 120/62  12/31/20 (!) 130/107   Repeat BMP Maintain current med doses F/up in 3-43months

## 2021-09-20 NOTE — Assessment & Plan Note (Signed)
Repeat hgbA1c: 5.4%

## 2021-09-30 DIAGNOSIS — Z1211 Encounter for screening for malignant neoplasm of colon: Secondary | ICD-10-CM | POA: Diagnosis not present

## 2021-10-07 ENCOUNTER — Telehealth: Payer: Self-pay

## 2021-10-07 LAB — COLOGUARD: COLOGUARD: NEGATIVE

## 2021-10-07 NOTE — Progress Notes (Signed)
° ° °  Chronic Care Management Pharmacy Assistant   Name: Ebony Mayer  MRN: [id number] DOB: 1946-07-13  Reason for Encounter: Medication Review/Medicaton Coordination Call.   Recent office visits:  09/18/2021 [name] NP (PCP) No medication changes noted, Follow up in 3-6 month  Recent consult visits:  No recent consult visit  Hospital visits:  None in previous 6 months  Medications: Outpatient Encounter Medications as of 10/07/2021  Medication Sig   amLODipine (NORVASC) 5 MG tablet TAKE ONE TABLET BY MOUTH EVERYDAY AT BEDTIME   aspirin EC 81 MG tablet Take 81 mg by mouth daily. Swallow whole.   calcium carbonate (OSCAL) 1500 (600 Ca) MG TABS tablet Take 600 mg of elemental calcium by mouth daily with breakfast.   diclofenac Sodium (VOLTAREN) 1 % GEL apply TWO grams TO THE AFFECTED AREA(S) EVERY MORNING, AT NOON, AT bedtime   gabapentin (NEURONTIN) 300 MG capsule TAKE ONE CAPSULE BY MOUTH EVERYDAY AT BEDTIME   hydrochlorothiazide (HYDRODIURIL) 25 MG tablet Take 1 tablet (25 mg total) by mouth every morning.   potassium chloride SA (KLOR-CON M) 20 MEQ tablet Take 1 tablet (20 mEq total) by mouth every morning.   No facility-administered encounter medications on file as of 10/07/2021.    Care Gaps: COVID-19 Vaccine (3 - Booster Pfizer series) Fecal DNA (Last Completed 05/17/2018)  Star Rating Drugs: None ID  Medication Fill Gaps: None ID  Reviewed chart for medication changes ahead of medication coordination call.    BP Readings from Last 3 Encounters:  09/18/21 132/74  01/08/21 120/62  12/31/20 (!) 130/107    Lab Results  Component Value Date   HGBA1C 5.4 09/18/2021     Patient obtains medications through Adherence Packaging  30 Days   Last adherence delivery included:  Gabapentin 300 mg daily - Bedtime Amlodipine 5 mg one tablet daily - Bedtime Hydrochlorothiazide 25 mg one tablet daily - Breakfast Potassium Cl ER 20 Meq one tablet daily -  Breakfast Aspirin 81 mg  one tablet daily- Vial Calcium Carbonate + D 600 mg twice daily- Vial Voltaren 1%  Gel PRN Use  Patient declined medication last month: None ID  Patient is due for next adherence delivery on: 10/19/2021. Called patient and reviewed medications and coordinated delivery.  This delivery to include: Gabapentin 300 mg daily - Bedtime Amlodipine 5 mg one tablet daily - Bedtime Hydrochlorothiazide 25 mg one tablet daily - Breakfast Potassium Cl ER 20 Meq one tablet daily - Breakfast Aspirin 81 mg  one tablet daily- Vial Calcium Carbonate + D 600 mg twice daily- Vial  Patient declined the following medications: Voltaren 1%  Gel PRN Use   Patient needs refills for None ID.   Confirmed delivery date of 10/19/2021 (Second route), advised patient that pharmacy will contact them the morning of delivery.  Patient states she is currently helping her daughter who has to get a blood transfusion because of her iron being low.Patient states she is hoping today will be the last day.  Telephone follow up appointment with Care management team member scheduled for : 12/03/2021 at 11:45 am  [facility] Clinical Pharmacist Assistant [phone]

## 2021-10-23 ENCOUNTER — Other Ambulatory Visit: Payer: Self-pay

## 2021-10-23 ENCOUNTER — Other Ambulatory Visit (INDEPENDENT_AMBULATORY_CARE_PROVIDER_SITE_OTHER): Payer: Medicare HMO

## 2021-10-23 DIAGNOSIS — I10 Essential (primary) hypertension: Secondary | ICD-10-CM | POA: Diagnosis not present

## 2021-10-23 LAB — BASIC METABOLIC PANEL
BUN: 13 mg/dL (ref 6–23)
CO2: 32 mEq/L (ref 19–32)
Calcium: 9.1 mg/dL (ref 8.4–10.5)
Chloride: 104 mEq/L (ref 96–112)
Creatinine, Ser: 0.95 mg/dL (ref 0.40–1.20)
GFR: 59.01 mL/min — ABNORMAL LOW (ref >60.00)
Glucose, Bld: 104 mg/dL — ABNORMAL HIGH (ref 70–99)
Potassium: 4.4 mEq/L (ref 3.5–5.1)
Sodium: 141 mEq/L (ref 135–145)

## 2021-10-23 NOTE — Progress Notes (Signed)
Per the order of [name] NP. pt is here for labs, pt tolerated draw well.

## 2021-11-04 ENCOUNTER — Telehealth: Payer: Self-pay

## 2021-11-04 NOTE — Progress Notes (Signed)
° ° °  Chronic Care Management Pharmacy Assistant   Name: Amarri Michaelson  MRN: [id number] DOB: May 08, 1946  Reason for Encounter: Medication Review/Medication Coordination Call.   Recent office visits:  No recent office visit  Recent consult visits:  No recent consult Visit  Hospital visits:  None in previous 6 months  Medications: Outpatient Encounter Medications as of 11/04/2021  Medication Sig   amLODipine (NORVASC) 5 MG tablet TAKE ONE TABLET BY MOUTH EVERYDAY AT BEDTIME   aspirin EC 81 MG tablet Take 81 mg by mouth daily. Swallow whole.   calcium carbonate (OSCAL) 1500 (600 Ca) MG TABS tablet Take 600 mg of elemental calcium by mouth daily with breakfast.   diclofenac Sodium (VOLTAREN) 1 % GEL apply TWO grams TO THE AFFECTED AREA(S) EVERY MORNING, AT NOON, AT bedtime   gabapentin (NEURONTIN) 300 MG capsule TAKE ONE CAPSULE BY MOUTH EVERYDAY AT BEDTIME   hydrochlorothiazide (HYDRODIURIL) 25 MG tablet Take 1 tablet (25 mg total) by mouth every morning.   potassium chloride SA (KLOR-CON M) 20 MEQ tablet Take 1 tablet (20 mEq total) by mouth every morning.   No facility-administered encounter medications on file as of 11/04/2021.    Care Gaps: COVID-19 Vaccine (3 Geologist, engineering series)    Star Rating Drugs: None ID   Medication Fill Gaps: None ID  Reviewed chart for medication changes ahead of medication coordination call.  BP Readings from Last 3 Encounters:  09/18/21 132/74  01/08/21 120/62  12/31/20 (!) 130/107    Lab Results  Component Value Date   HGBA1C 5.4 09/18/2021     Patient obtains medications through Adherence Packaging  30 Days   Last adherence delivery included: Gabapentin 300 mg daily - Bedtime Amlodipine 5 mg one tablet daily - Bedtime Hydrochlorothiazide 25 mg one tablet daily - Breakfast Potassium Cl ER 20 Meq one tablet daily - Breakfast Aspirin 81 mg  one tablet daily- Vial Calcium Carbonate + D 600 mg twice daily- Vial  Patient declined  medications  last month: Voltaren 1%  Gel PRN Use   Patient is due for next adherence delivery on: 11/17/2021. Called patient and reviewed medications and coordinated delivery.  This delivery to include: Gabapentin 300 mg daily - Bedtime Amlodipine 5 mg one tablet daily - Bedtime Hydrochlorothiazide 25 mg one tablet daily - Breakfast Potassium Cl ER 20 Meq one tablet daily - Breakfast Aspirin 81 mg  one tablet daily- Vial Calcium Carbonate + D 600 mg twice daily- Vial   Patient declined the following medications: Voltaren 1%  Gel PRN Use   Patient needs refills for None ID.  Confirmed delivery date of 11/17/2021 (First route), advised patient that pharmacy will contact them the morning of delivery.  Telephone follow up appointment with Care management team member scheduled for : 12/03/2021 at 11:45 am  [facility] Clinical Pharmacist Assistant [phone]

## 2021-11-30 ENCOUNTER — Telehealth: Payer: Medicare HMO

## 2021-12-03 ENCOUNTER — Telehealth: Payer: Medicare HMO

## 2021-12-07 ENCOUNTER — Telehealth: Payer: Self-pay

## 2021-12-07 NOTE — Progress Notes (Signed)
? ? ?  Chronic Care Management ?Pharmacy Assistant  ? ?Name: Ebony Mayer  MRN: [id number] DOB: [birth year] ? ?Reason for Encounter: Medication Review/Medication Coordination Call. ?  ?Recent office visits:  ?No recent office visit ?  ?Recent consult visits:  ?No recent consult Visit ?  ?Hospital visits:  ?None in previous 6 months ? ?Medications: ?Outpatient Encounter Medications as of 12/07/2021  ?Medication Sig  ? amLODipine (NORVASC) 5 MG tablet TAKE ONE TABLET BY MOUTH EVERYDAY AT BEDTIME  ? aspirin EC 81 MG tablet Take 81 mg by mouth daily. Swallow whole.  ? calcium carbonate (OSCAL) 1500 (600 Ca) MG TABS tablet Take 600 mg of elemental calcium by mouth daily with breakfast.  ? diclofenac Sodium (VOLTAREN) 1 % GEL apply TWO grams TO THE AFFECTED AREA(S) EVERY MORNING, AT NOON, AT bedtime  ? gabapentin (NEURONTIN) 300 MG capsule TAKE ONE CAPSULE BY MOUTH EVERYDAY AT BEDTIME  ? hydrochlorothiazide (HYDRODIURIL) 25 MG tablet Take 1 tablet (25 mg total) by mouth every morning.  ? potassium chloride SA (KLOR-CON M) 20 MEQ tablet Take 1 tablet (20 mEq total) by mouth every morning.  ? ?No facility-administered encounter medications on file as of 12/07/2021.  ? ?Care Gaps: ?COVID-19 Vaccine (3 Geologist, engineering series) ? ?Star Rating Drugs: ?None ID ?  ?Medication Fill Gaps: ?None ID ? ?Reviewed chart for medication changes ahead of medication coordination call. ? ?BP Readings from Last 3 Encounters:  ?09/18/21 132/74  ?01/08/21 120/62  ?12/31/20 (!) 130/107  ?  ?Lab Results  ?Component Value Date  ? HGBA1C 5.4 09/18/2021  ?  ? ?Patient obtains medications through Adherence Packaging  30 Days  ? ?Last adherence delivery included: ?Gabapentin 300 mg daily - Bedtime ?Amlodipine 5 mg one tablet daily - Bedtime ?Hydrochlorothiazide 25 mg one tablet daily - Breakfast ?Potassium Cl ER 20 Meq one tablet daily - Breakfast ?Aspirin 81 mg  one tablet daily- Vial ?Calcium Carbonate + D 600 mg twice daily- Vial ? ?Patient declined  medications last month: ?Voltaren 1%  Gel PRN Use  ? ?Patient is due for next adherence delivery on: 12/17/2021. ?Called patient and reviewed medications and coordinated delivery. ? ?This delivery to include: ?Gabapentin 300 mg daily - Bedtime ?Amlodipine 5 mg one tablet daily - Bedtime ?Hydrochlorothiazide 25 mg one tablet daily - Breakfast ?Potassium Cl ER 20 Meq one tablet daily - Breakfast ?Aspirin 81 mg  one tablet daily- Vial ?Calcium Carbonate + D 600 mg twice daily- Vial ?Voltaren 1%  Gel PRN Use  ? ? ?Patient declined the following medications ?None ID ? ?Patient needs refills for None ID. ? ?Confirmed delivery date of 12/17/2021 (Second route), advised patient that pharmacy will contact them the morning of delivery. ? ?Everlean Cherry ?Clinical Pharmacist Assistant ?[phone]  ? ?

## 2022-01-05 ENCOUNTER — Telehealth: Payer: Self-pay

## 2022-01-05 NOTE — Progress Notes (Signed)
? ? ?  Chronic Care Management ?Pharmacy Assistant  ? ?Name: Rochell Puett  MRN: [id number] DOB: Sep 18, 1945 ? ?Reason for Encounter: Medication Review/Medication Coordination Call. ?  ?Recent office visits:  ?No recent office visit ?  ?Recent consult visits:  ?No recent consult Visit ?  ?Hospital visits:  ?None in previous 6 months ? ?Medications: ?Outpatient Encounter Medications as of 01/05/2022  ?Medication Sig  ? amLODipine (NORVASC) 5 MG tablet TAKE ONE TABLET BY MOUTH EVERYDAY AT BEDTIME  ? aspirin EC 81 MG tablet Take 81 mg by mouth daily. Swallow whole.  ? calcium carbonate (OSCAL) 1500 (600 Ca) MG TABS tablet Take 600 mg of elemental calcium by mouth daily with breakfast.  ? diclofenac Sodium (VOLTAREN) 1 % GEL apply TWO grams TO THE AFFECTED AREA(S) EVERY MORNING, AT NOON, AT bedtime  ? gabapentin (NEURONTIN) 300 MG capsule TAKE ONE CAPSULE BY MOUTH EVERYDAY AT BEDTIME  ? hydrochlorothiazide (HYDRODIURIL) 25 MG tablet Take 1 tablet (25 mg total) by mouth every morning.  ? potassium chloride SA (KLOR-CON M) 20 MEQ tablet Take 1 tablet (20 mEq total) by mouth every morning.  ? ?No facility-administered encounter medications on file as of 01/05/2022.  ? ? ?Care Gaps: ?COVID-19 Vaccine (3 Geologist, engineering series) ?Shingrix Vaccine ? Mammogram ?  ?Star Rating Drugs: ?None ID ?  ?Medication Fill Gaps: ?None ID ? ?Reviewed chart for medication changes ahead of medication coordination call. ? ?BP Readings from Last 3 Encounters:  ?09/18/21 132/74  ?01/08/21 120/62  ?12/31/20 (!) 130/107  ?  ?Lab Results  ?Component Value Date  ? HGBA1C 5.4 09/18/2021  ?  ? ?Patient obtains medications through Adherence Packaging  30 Days  ? ?Last adherence delivery included:  ?Gabapentin 300 mg daily - Bedtime ?Amlodipine 5 mg one tablet daily - Bedtime ?Hydrochlorothiazide 25 mg one tablet daily - Breakfast ?Potassium Cl ER 20 Meq one tablet daily - Breakfast ?Aspirin 81 mg  one tablet daily- Vial ?Calcium Carbonate + D 600 mg twice  daily- Vial ?Voltaren 1%  Gel PRN Use  ? ?Patient declined medications last month ?None ID ? ?Patient is due for next adherence delivery on: 01/15/2022.Marland Kitchen ?Called patient and reviewed medications and coordinated delivery. ? ?This delivery to include: ?Gabapentin 300 mg daily - Bedtime ?Amlodipine 5 mg one tablet daily - Bedtime ?Hydrochlorothiazide 25 mg one tablet daily - Breakfast ?Potassium Cl ER 20 Meq one tablet daily - Breakfast ?Aspirin 81 mg  one tablet daily- Vial ?Calcium Carbonate + D 600 mg twice daily- Vial ? ?Patient declined the following medications: ?Voltaren 1%  Gel PRN Use - Adequate supply ? ?Patient needs refills for None ID. ? ?Confirmed delivery date of 01/15/2022 (Second Route), advised patient that pharmacy will contact them the morning of delivery. ? ?Everlean Cherry ?Clinical Pharmacist Assistant ?[phone]  ? ?

## 2022-01-29 ENCOUNTER — Other Ambulatory Visit: Payer: Self-pay | Admitting: Nurse Practitioner

## 2022-01-29 DIAGNOSIS — Z1231 Encounter for screening mammogram for malignant neoplasm of breast: Secondary | ICD-10-CM

## 2022-02-03 ENCOUNTER — Telehealth: Payer: Self-pay

## 2022-02-03 NOTE — Progress Notes (Signed)
    Chronic Care Management Pharmacy Assistant   Name: Emme Rosenau  MRN: [id number] DOB: 06-24-1946  Reason for Encounter: Medication Review/Medication Coordination Call.   Recent office visits:  No recent office visit  Recent consult visits:  No recent consult visit  Hospital visits:  None in previous 6 months  Medications: Outpatient Encounter Medications as of 02/03/2022  Medication Sig   amLODipine (NORVASC) 5 MG tablet TAKE ONE TABLET BY MOUTH EVERYDAY AT BEDTIME   aspirin EC 81 MG tablet Take 81 mg by mouth daily. Swallow whole.   calcium carbonate (OSCAL) 1500 (600 Ca) MG TABS tablet Take 600 mg of elemental calcium by mouth daily with breakfast.   diclofenac Sodium (VOLTAREN) 1 % GEL apply TWO grams TO THE AFFECTED AREA(S) EVERY MORNING, AT NOON, AT bedtime   gabapentin (NEURONTIN) 300 MG capsule TAKE ONE CAPSULE BY MOUTH EVERYDAY AT BEDTIME   hydrochlorothiazide (HYDRODIURIL) 25 MG tablet Take 1 tablet (25 mg total) by mouth every morning.   potassium chloride SA (KLOR-CON M) 20 MEQ tablet Take 1 tablet (20 mEq total) by mouth every morning.   No facility-administered encounter medications on file as of 02/03/2022.   Care Gaps: COVID-19 Vaccine (3 - Sales executive series) Shingrix Vaccine Tdap vaccine   Star Rating Drugs: None ID   Medication Fill Gaps: None ID  Reviewed chart for medication changes ahead of medication coordination call.  BP Readings from Last 3 Encounters:  09/18/21 132/74  01/08/21 120/62  12/31/20 (!) 130/107    Lab Results  Component Value Date   HGBA1C 5.4 09/18/2021     Patient obtains medications through Adherence Packaging  30 Days   Last adherence delivery included:  Gabapentin 300 mg daily - Bedtime Amlodipine 5 mg one tablet daily - Bedtime Hydrochlorothiazide 25 mg one tablet daily - Breakfast Potassium Cl ER 20 Meq one tablet daily - Breakfast Aspirin 81 mg  one tablet daily- Vial Calcium Carbonate + D 600 mg twice  daily- Vial   Patient declined medications last month: Voltaren 1%  Gel PRN Use - Adequate supply  Patient is due for next adherence delivery on: 02/16/2022. Called patient and reviewed medications and coordinated delivery.  This delivery to include: Gabapentin 300 mg daily - Bedtime Amlodipine 5 mg one tablet daily - Bedtime Hydrochlorothiazide 25 mg one tablet daily - Breakfast Potassium Cl ER 20 Meq one tablet daily - Breakfast Aspirin 81 mg  one tablet daily- Vial Calcium Carbonate + D 600 mg twice daily- Vial Voltaren 1%  Gel PRN Use - Adequate supply   Patient declined the following medications: None ID   Patient needs refills for None ID.  Confirmed delivery date of 02/16/2022 (Second route), advised patient that pharmacy will contact them the morning of delivery.  Everlean Cherry Clinical Pharmacist Assistant [phone]

## 2022-02-26 ENCOUNTER — Ambulatory Visit
Admission: RE | Admit: 2022-02-26 | Discharge: 2022-02-26 | Disposition: A | Discharge status: Home / Self Care | Payer: Medicare HMO | Source: Ambulatory Visit | Attending: Nurse Practitioner | Admitting: Nurse Practitioner

## 2022-02-26 DIAGNOSIS — Z1231 Encounter for screening mammogram for malignant neoplasm of breast: Secondary | ICD-10-CM | POA: Diagnosis not present

## 2022-03-04 ENCOUNTER — Telehealth: Payer: Self-pay

## 2022-03-04 NOTE — Progress Notes (Cosign Needed)
    Chronic Care Management Pharmacy Assistant   Name: Ebony Mayer  MRN: [id number] DOB: 1946/05/25   Reason for Encounter: Medication Review/Medication Coordination Call.   Recent office visits:  No recent office visit   Recent consult visits:  No recent consult visit   Hospital visits:  None in previous 6 months  Medications: Outpatient Encounter Medications as of 03/04/2022  Medication Sig   amLODipine (NORVASC) 5 MG tablet TAKE ONE TABLET BY MOUTH EVERYDAY AT BEDTIME   aspirin EC 81 MG tablet Take 81 mg by mouth daily. Swallow whole.   calcium carbonate (OSCAL) 1500 (600 Ca) MG TABS tablet Take 600 mg of elemental calcium by mouth daily with breakfast.   diclofenac Sodium (VOLTAREN) 1 % GEL apply TWO grams TO THE AFFECTED AREA(S) EVERY MORNING, AT NOON, AT bedtime   gabapentin (NEURONTIN) 300 MG capsule TAKE ONE CAPSULE BY MOUTH EVERYDAY AT BEDTIME   hydrochlorothiazide (HYDRODIURIL) 25 MG tablet Take 1 tablet (25 mg total) by mouth every morning.   potassium chloride SA (KLOR-CON M) 20 MEQ tablet Take 1 tablet (20 mEq total) by mouth every morning.   No facility-administered encounter medications on file as of 03/04/2022.    Care Gaps: COVID-19 Vaccine (3 - Sales executive series) Shingrix Vaccine Tdap vaccine   Star Rating Drugs: None ID   Medication Fill Gaps: None ID  Reviewed chart for medication changes ahead of medication coordination call.   BP Readings from Last 3 Encounters:  09/18/21 132/74  01/08/21 120/62  12/31/20 (!) 130/107    Lab Results  Component Value Date   HGBA1C 5.4 09/18/2021     Patient obtains medications through Adherence Packaging  30 Days   Last adherence delivery included:  Gabapentin 300 mg daily - Bedtime Amlodipine 5 mg one tablet daily - Bedtime Hydrochlorothiazide 25 mg one tablet daily - Breakfast Potassium Cl ER 20 Meq one tablet daily - Breakfast Aspirin 81 mg  one tablet daily- Vial Calcium Carbonate + D 600 mg  twice daily- Vial Voltaren 1%  Gel PRN Use - Adequate supply    Patient declined medication  last month None ID   Patient is due for next adherence delivery on: 03/17/2022. Called patient and reviewed medications and coordinated delivery.  This delivery to include: Gabapentin 300 mg daily - Bedtime Amlodipine 5 mg one tablet daily - Bedtime Hydrochlorothiazide 25 mg one tablet daily - Breakfast Potassium Cl ER 20 Meq one tablet daily - Breakfast Aspirin 81 mg  one tablet daily- Vial Calcium Carbonate + D 600 mg twice daily- Vial   Patient declined the following medications: Voltaren 1%  Gel PRN Use - Adequate supply  Patient needs refills for None ID.  Confirmed delivery date of 03/17/2022 ([facility]), advised patient that pharmacy will contact them the morning of delivery.  Everlean Cherry Clinical Pharmacist Assistant [phone]

## 2022-04-05 ENCOUNTER — Telehealth: Payer: Self-pay

## 2022-04-05 NOTE — Progress Notes (Signed)
    Chronic Care Management Pharmacy Assistant   Name: Ebony Mayer  MRN: [id number] DOB: 1946/01/05  Reason for Encounter: Medication Review/Medication Coordination Call.   Recent office visits:  No recent office visit   Recent consult visits:  No recent consult visit   Hospital visits:  None in previous 6 months  Medications: Outpatient Encounter Medications as of 04/05/2022  Medication Sig   amLODipine (NORVASC) 5 MG tablet TAKE ONE TABLET BY MOUTH EVERYDAY AT BEDTIME   aspirin EC 81 MG tablet Take 81 mg by mouth daily. Swallow whole.   calcium carbonate (OSCAL) 1500 (600 Ca) MG TABS tablet Take 600 mg of elemental calcium by mouth daily with breakfast.   diclofenac Sodium (VOLTAREN) 1 % GEL apply TWO grams TO THE AFFECTED AREA(S) EVERY MORNING, AT NOON, AT bedtime   gabapentin (NEURONTIN) 300 MG capsule TAKE ONE CAPSULE BY MOUTH EVERYDAY AT BEDTIME   hydrochlorothiazide (HYDRODIURIL) 25 MG tablet Take 1 tablet (25 mg total) by mouth every morning.   potassium chloride SA (KLOR-CON M) 20 MEQ tablet Take 1 tablet (20 mEq total) by mouth every morning.   No facility-administered encounter medications on file as of 04/05/2022.   Care Gaps: COVID-19 Vaccine (3 - Sales executive series) Shingrix Vaccine Tdap vaccine   Star Rating Drugs: None ID   Medication Fill Gaps: None ID  Reviewed chart for medication changes ahead of medication coordination call.   BP Readings from Last 3 Encounters:  09/18/21 132/74  01/08/21 120/62  12/31/20 (!) 130/107    Lab Results  Component Value Date   HGBA1C 5.4 09/18/2021     Patient obtains medications through Adherence Packaging  30 Days   Last adherence delivery included:  Gabapentin 300 mg daily - Bedtime Amlodipine 5 mg one tablet daily - Bedtime Hydrochlorothiazide 25 mg one tablet daily - Breakfast Potassium Cl ER 20 Meq one tablet daily - Breakfast Aspirin 81 mg  one tablet daily- Vial Calcium Carbonate + D 600 mg  twice daily- Vial  Patient declined medications last month: Voltaren 1%  Gel PRN Use - Adequate supply  Patient is due for next adherence delivery on: 04/15/2022. Called patient and reviewed medications and coordinated delivery.  This delivery to include: Gabapentin 300 mg daily - Bedtime Amlodipine 5 mg one tablet daily - Bedtime Hydrochlorothiazide 25 mg one tablet daily - Breakfast Potassium Cl ER 20 Meq one tablet daily - Breakfast Aspirin 81 mg  one tablet daily- Vial Calcium Carbonate + D 600 mg twice daily- Vial  Patient declined the following medications: Voltaren 1%  Gel PRN Use - Adequate supply  Patient needs refills for None ID.  Confirmed delivery date of 04/15/2022 ([facility]), advised patient that pharmacy will contact them the morning of delivery.  Everlean Cherry Clinical Pharmacist Assistant [phone]

## 2022-04-08 ENCOUNTER — Other Ambulatory Visit: Payer: Self-pay | Admitting: Nurse Practitioner

## 2022-04-08 DIAGNOSIS — E876 Hypokalemia: Secondary | ICD-10-CM

## 2022-04-08 DIAGNOSIS — I10 Essential (primary) hypertension: Secondary | ICD-10-CM

## 2022-04-08 NOTE — Telephone Encounter (Signed)
Chart supports Rx Last OV: 09/2021 Next OV: Not scheduled   30 day supply given, Pt needs appt for further refills

## 2022-05-05 ENCOUNTER — Telehealth: Payer: Self-pay

## 2022-05-05 NOTE — Progress Notes (Signed)
    Chronic Care Management Pharmacy Assistant   Name: Ebony Mayer  MRN: [id number] DOB: April 08, 1946  Reason for Encounter: Medication Review/Medications Coordination Call.   Recent office visits:  No recent office visit   Recent consult visits:  No recent consult visit   Hospital visits:  None in previous 6 months  Medications: Outpatient Encounter Medications as of 05/05/2022  Medication Sig   amLODipine (NORVASC) 5 MG tablet TAKE ONE TABLET BY MOUTH EVERYDAY AT BEDTIME   aspirin EC 81 MG tablet Take 81 mg by mouth daily. Swallow whole.   calcium carbonate (OSCAL) 1500 (600 Ca) MG TABS tablet Take 600 mg of elemental calcium by mouth daily with breakfast.   diclofenac Sodium (VOLTAREN) 1 % GEL apply TWO grams TO THE AFFECTED AREA(S) EVERY MORNING, AT NOON, AT bedtime   gabapentin (NEURONTIN) 300 MG capsule TAKE ONE CAPSULE BY MOUTH EVERYDAY AT BEDTIME   hydrochlorothiazide (HYDRODIURIL) 25 MG tablet TAKE ONE TABLET BY MOUTH EVERY MORNING   potassium chloride SA (KLOR-CON M) 20 MEQ tablet TAKE ONE TABLET BY MOUTH EVERY MORNING   No facility-administered encounter medications on file as of 05/05/2022.    Care Gaps: COVID-19 Vaccine (3 - Sales executive series) Shingrix Vaccine Tdap vaccine Influenza Vaccine   Star Rating Drugs: None ID   Medication Fill Gaps: None ID  Reviewed chart for medication changes ahead of medication coordination call.   BP Readings from Last 3 Encounters:  09/18/21 132/74  01/08/21 120/62  12/31/20 (!) 130/107    Lab Results  Component Value Date   HGBA1C 5.4 09/18/2021     Patient obtains medications through Adherence Packaging  30 Days   Last adherence delivery included:  Gabapentin 300 mg daily - Bedtime Amlodipine 5 mg one tablet daily - Bedtime Hydrochlorothiazide 25 mg one tablet daily - Breakfast Potassium Cl ER 20 Meq one tablet daily - Breakfast Aspirin 81 mg  one tablet daily- Vial Calcium Carbonate + D 600 mg twice  daily- Vial   Patient declined Medications last month  Voltaren 1%  Gel PRN Use - Adequate supply  Patient is due for next adherence delivery on: 05/17/2022. Called patient and reviewed medications and coordinated delivery.  This delivery to include: Gabapentin 300 mg daily - Bedtime Amlodipine 5 mg one tablet daily - Bedtime Hydrochlorothiazide 25 mg one tablet daily - Breakfast Potassium Cl ER 20 Meq one tablet daily - Breakfast Aspirin 81 mg  one tablet daily- Vial Calcium Carbonate + D 600 mg twice daily- Vial Voltaren 1%  Gel PRN Use - Adequate supply   Patient declined the following medications: None ID  Patient needs refills for None ID.  Confirmed delivery date of 05/17/2022 ([facility]), advised patient that pharmacy will contact them the morning of delivery.  Everlean Cherry Clinical Pharmacist Assistant [phone]

## 2022-05-12 ENCOUNTER — Other Ambulatory Visit: Payer: Self-pay | Admitting: Nurse Practitioner

## 2022-05-12 ENCOUNTER — Telehealth: Payer: Self-pay | Admitting: Nurse Practitioner

## 2022-05-12 DIAGNOSIS — E876 Hypokalemia: Secondary | ICD-10-CM

## 2022-05-12 DIAGNOSIS — I10 Essential (primary) hypertension: Secondary | ICD-10-CM

## 2022-05-12 NOTE — Telephone Encounter (Signed)
I called pt and left a detailed message concerning her appointment.

## 2022-05-12 NOTE — Telephone Encounter (Signed)
Chart supports Rx Last OV: 09/2021 Next OV: not scheduled, 30 day supply sent, needs f/u appt for further refills

## 2022-06-01 ENCOUNTER — Ambulatory Visit (INDEPENDENT_AMBULATORY_CARE_PROVIDER_SITE_OTHER): Payer: Medicare HMO | Admitting: Nurse Practitioner

## 2022-06-01 ENCOUNTER — Encounter: Payer: Self-pay | Admitting: Nurse Practitioner

## 2022-06-01 VITALS — BP 128/70 | HR 98 | Temp 96.5°F | Ht 65.0 in | Wt 163.2 lb

## 2022-06-01 DIAGNOSIS — I10 Essential (primary) hypertension: Secondary | ICD-10-CM | POA: Diagnosis not present

## 2022-06-01 DIAGNOSIS — E782 Mixed hyperlipidemia: Secondary | ICD-10-CM

## 2022-06-01 DIAGNOSIS — T502X5A Adverse effect of carbonic-anhydrase inhibitors, benzothiadiazides and other diuretics, initial encounter: Secondary | ICD-10-CM | POA: Diagnosis not present

## 2022-06-01 DIAGNOSIS — R739 Hyperglycemia, unspecified: Secondary | ICD-10-CM

## 2022-06-01 DIAGNOSIS — M79605 Pain in left leg: Secondary | ICD-10-CM

## 2022-06-01 DIAGNOSIS — E876 Hypokalemia: Secondary | ICD-10-CM

## 2022-06-01 DIAGNOSIS — M7632 Iliotibial band syndrome, left leg: Secondary | ICD-10-CM | POA: Diagnosis not present

## 2022-06-01 LAB — BASIC METABOLIC PANEL
BUN: 13 mg/dL (ref 6–23)
CO2: 30 mEq/L (ref 19–32)
Calcium: 9.4 mg/dL (ref 8.4–10.5)
Chloride: 103 mEq/L (ref 96–112)
Creatinine, Ser: 1.11 mg/dL (ref 0.40–1.20)
GFR: 48.75 mL/min — ABNORMAL LOW (ref >60.00)
Glucose, Bld: 120 mg/dL — ABNORMAL HIGH (ref 70–99)
Potassium: 3.5 mEq/L (ref 3.5–5.1)
Sodium: 140 mEq/L (ref 135–145)

## 2022-06-01 MED ORDER — AMLODIPINE BESYLATE 5 MG PO TABS: ORAL_TABLET | ORAL | 3 refills | Status: DC

## 2022-06-01 MED ORDER — HYDROCHLOROTHIAZIDE 25 MG PO TABS: 25.0000 mg | ORAL_TABLET | Freq: Every morning | ORAL | 1 refills | Status: DC

## 2022-06-01 MED ORDER — GABAPENTIN 300 MG PO CAPS: ORAL_CAPSULE | ORAL | 3 refills | Status: DC

## 2022-06-01 MED ORDER — POTASSIUM CHLORIDE CRYS ER 20 MEQ PO TBCR: 20.0000 meq | EXTENDED_RELEASE_TABLET | Freq: Every morning | ORAL | 1 refills | Status: DC

## 2022-06-01 NOTE — Assessment & Plan Note (Signed)
LDL at goal Maintain with diet

## 2022-06-01 NOTE — Assessment & Plan Note (Signed)
BP at goal with amlodipine and HCTZ BP Readings from Last 3 Encounters:  06/01/22 128/70  09/18/21 132/74  01/08/21 120/62   Repeat BMP Maintain med dose F/up in 51months

## 2022-06-01 NOTE — Progress Notes (Signed)
Established Patient Visit  Patient: Ebony Mayer   DOB: [birth year]   76 y.o. Female  MRN: [id number] Visit Date: 06/01/2022  Subjective:    Chief Complaint  Patient presents with   Office Visit    Medication refills  No concerns    HPI Essential hypertension, benign BP at goal with amlodipine and HCTZ BP Readings from Last 3 Encounters:  06/01/22 128/70  09/18/21 132/74  01/08/21 120/62   Repeat BMP Maintain med dose F/up in 69months  Mixed hyperlipidemia LDL at goal Maintain with diet  Reviewed medical, surgical, and social history today  Medications: Outpatient Medications Prior to Visit  Medication Sig   aspirin EC 81 MG tablet Take 81 mg by mouth daily. Swallow whole.   calcium carbonate (OSCAL) 1500 (600 Ca) MG TABS tablet Take 600 mg of elemental calcium by mouth daily with breakfast.   diclofenac Sodium (VOLTAREN) 1 % GEL apply TWO grams TO THE AFFECTED AREA(S) EVERY MORNING, AT NOON, AT bedtime   [DISCONTINUED] amLODipine (NORVASC) 5 MG tablet TAKE ONE TABLET BY MOUTH EVERYDAY AT BEDTIME   [DISCONTINUED] gabapentin (NEURONTIN) 300 MG capsule TAKE ONE CAPSULE BY MOUTH EVERYDAY AT BEDTIME   [DISCONTINUED] hydrochlorothiazide (HYDRODIURIL) 25 MG tablet TAKE ONE TABLET BY MOUTH EVERY MORNING   [DISCONTINUED] potassium chloride SA (KLOR-CON M) 20 MEQ tablet TAKE ONE TABLET BY MOUTH EVERY MORNING   No facility-administered medications prior to visit.   Reviewed past medical and social history.   ROS per HPI above      Objective:  BP 128/70   Pulse 98   Temp (!) 96.5 F (35.8 C) (Temporal)   Ht 5\' 5"  (1.651 m)   Wt 163 lb 3.2 oz (74 kg)   SpO2 99%   BMI 27.16 kg/m      Physical Exam Vitals reviewed.  Cardiovascular:     Rate and Rhythm: Normal rate and regular rhythm.     Pulses: Normal pulses.     Heart sounds: Normal heart sounds.  Pulmonary:     Effort: Pulmonary effort is normal.     Breath sounds: Normal breath sounds.   Musculoskeletal:     Right lower leg: No edema.     Left lower leg: No edema.  Neurological:     Mental Status: She is alert and oriented to person, place, and time.     No results found for any visits on 06/01/22.    Assessment & Plan:    Problem List Items Addressed This Visit       Cardiovascular and Mediastinum   Essential hypertension, benign - Primary    BP at goal with amlodipine and HCTZ BP Readings from Last 3 Encounters:  06/01/22 128/70  09/18/21 132/74  01/08/21 120/62   Repeat BMP Maintain med dose F/up in 10months      Relevant Medications   amLODipine (NORVASC) 5 MG tablet   potassium chloride SA (KLOR-CON M) 20 MEQ tablet   hydrochlorothiazide (HYDRODIURIL) 25 MG tablet   Other Relevant Orders   Basic metabolic panel     Musculoskeletal and Integument   It band syndrome, left   Relevant Medications   gabapentin (NEURONTIN) 300 MG capsule     Other   Leg pain, lateral   Relevant Medications   gabapentin (NEURONTIN) 300 MG capsule   Mixed hyperlipidemia    LDL at goal Maintain with diet      Relevant Medications  amLODipine (NORVASC) 5 MG tablet   hydrochlorothiazide (HYDRODIURIL) 25 MG tablet   Other Visit Diagnoses     Diuretic-induced hypokalemia       Relevant Medications   potassium chloride SA (KLOR-CON M) 20 MEQ tablet      Return in about 6 months (around 11/30/2022) for CPE (fasting).     Wilfred Lacy, NP

## 2022-06-01 NOTE — Patient Instructions (Signed)
Maintain current medications Go to lab 

## 2022-06-01 NOTE — Addendum Note (Signed)
Addended by: Wilfred Lacy L on: 06/01/2022 02:28 PM   Modules accepted: Orders

## 2022-06-03 ENCOUNTER — Telehealth: Payer: Self-pay | Admitting: Nurse Practitioner

## 2022-06-03 ENCOUNTER — Telehealth: Payer: Self-pay

## 2022-06-03 NOTE — Progress Notes (Addendum)
    Chronic Care Management Pharmacy Assistant   Name: Tambria Pfannenstiel  MRN: [id number] DOB: 11/16/1945  Reason for Encounter: Medication Review/Medication Coordination Call.    Recent office visits:  06/01/2022 Wilfred Lacy NP (PCP) No Medication Changes noted, Return in about 6 months   Recent consult visits:  No Recent consult visit  Hospital visits:  None in previous 6 months  Medications: Outpatient Encounter Medications as of 06/03/2022  Medication Sig   amLODipine (NORVASC) 5 MG tablet TAKE ONE TABLET BY MOUTH EVERYDAY AT BEDTIME   aspirin EC 81 MG tablet Take 81 mg by mouth daily. Swallow whole.   calcium carbonate (OSCAL) 1500 (600 Ca) MG TABS tablet Take 600 mg of elemental calcium by mouth daily with breakfast.   diclofenac Sodium (VOLTAREN) 1 % GEL apply TWO grams TO THE AFFECTED AREA(S) EVERY MORNING, AT NOON, AT bedtime   gabapentin (NEURONTIN) 300 MG capsule TAKE ONE CAPSULE BY MOUTH EVERYDAY AT BEDTIME   hydrochlorothiazide (HYDRODIURIL) 25 MG tablet Take 1 tablet (25 mg total) by mouth every morning.   potassium chloride SA (KLOR-CON M) 20 MEQ tablet Take 1 tablet (20 mEq total) by mouth every morning.   No facility-administered encounter medications on file as of 06/03/2022.    Care Gaps: None ID   Star Rating Drugs: None ID   Medication Fill Gaps: None ID  Reviewed chart for medication changes ahead of medication coordination call.  BP Readings from Last 3 Encounters:  06/01/22 128/70  09/18/21 132/74  01/08/21 120/62    Lab Results  Component Value Date   HGBA1C 5.4 09/18/2021     Patient obtains medications through Adherence Packaging  30 Days   Last adherence delivery included:  Gabapentin 300 mg daily - Bedtime Amlodipine 5 mg one tablet daily - Bedtime Hydrochlorothiazide 25 mg one tablet daily - Breakfast Potassium Cl ER 20 Meq one tablet daily - Breakfast Aspirin 81 mg  one tablet daily- Vial Calcium Carbonate + D 600 mg twice daily-  Vial Voltaren 1%  Gel PRN Use  Patient declined medications last month: None ID  Patient is due for next adherence delivery on: 06/15/2022. Called patient and reviewed medications and coordinated delivery.  This delivery to include: Gabapentin 300 mg daily - Bedtime Amlodipine 5 mg one tablet daily - Bedtime Hydrochlorothiazide 25 mg one tablet every other day - Breakfast Potassium Cl ER 20 Meq one tablet daily - Breakfast Aspirin 81 mg  one tablet daily- Vial Calcium Carbonate + D 600 mg twice daily- Vial  Patient declined the following medications: Voltaren 1%  Gel PRN Use - Adequate supply  Patient needs refills for None ID.  Confirmed delivery date of 06/15/2022 ([facility]), advised patient that pharmacy will contact them the morning of delivery.  [place] Pharmacist Assistant [phone]

## 2022-06-03 NOTE — Telephone Encounter (Signed)
Called & went over medication & instructions w/ Pt.

## 2022-06-03 NOTE — Telephone Encounter (Signed)
Caller Name: Giorgia Wahler Call back phone #: [phone]  Reason for Call: Pt was given instructions on when to take her meds. Asked for a call back for clarification

## 2022-06-04 ENCOUNTER — Telehealth: Payer: Self-pay | Admitting: Nurse Practitioner

## 2022-06-04 NOTE — Telephone Encounter (Signed)
-----   Message from [place], [place] sent at 06/01/2022  4:15 PM EDT ----- Mrs. Piche will have to return to have A1C collected. Only serum was collected. ----- Message ----- From: Flossie Buffy, NP Sent: 06/01/2022   2:27 PM EDT To: Adora Fridge, CMA  Please add hgbA1c. Thank you

## 2022-06-05 ENCOUNTER — Other Ambulatory Visit: Payer: Self-pay | Admitting: Nurse Practitioner

## 2022-06-05 DIAGNOSIS — M7632 Iliotibial band syndrome, left leg: Secondary | ICD-10-CM

## 2022-06-05 DIAGNOSIS — M79605 Pain in left leg: Secondary | ICD-10-CM

## 2022-06-07 ENCOUNTER — Telehealth: Payer: Self-pay | Admitting: Nurse Practitioner

## 2022-06-07 NOTE — Telephone Encounter (Signed)
LVM, adv pt to call back to schedule  

## 2022-06-07 NOTE — Telephone Encounter (Signed)
Appt scheduled

## 2022-06-07 NOTE — Telephone Encounter (Signed)
Pt called and stated that you called her and she missed the call

## 2022-06-08 ENCOUNTER — Other Ambulatory Visit (INDEPENDENT_AMBULATORY_CARE_PROVIDER_SITE_OTHER): Payer: Medicare HMO

## 2022-06-08 DIAGNOSIS — R739 Hyperglycemia, unspecified: Secondary | ICD-10-CM

## 2022-06-08 LAB — HEMOGLOBIN A1C: Hgb A1c MFr Bld: 5.4 % (ref 4.6–6.5)

## 2022-06-09 ENCOUNTER — Telehealth: Payer: Self-pay

## 2022-06-09 DIAGNOSIS — I10 Essential (primary) hypertension: Secondary | ICD-10-CM

## 2022-06-09 MED ORDER — HYDROCHLOROTHIAZIDE 25 MG PO TABS: 25.0000 mg | ORAL_TABLET | ORAL | 1 refills | Status: DC

## 2022-06-11 NOTE — Telephone Encounter (Signed)
Refill requested sent into pharmacy.

## 2022-07-05 ENCOUNTER — Telehealth: Payer: Self-pay

## 2022-07-05 NOTE — Progress Notes (Signed)
    Chronic Care Management Pharmacy Assistant   Name: Ebony Mayer  MRN: [id number] DOB: 1946/05/13  Reason for Encounter: Medication Review/Medication Coordination Call.  Recent office visits:  None ID  Recent consult visits:  None ID  Hospital visits:  None in previous 6 months  Medications: Outpatient Encounter Medications as of 07/05/2022  Medication Sig   amLODipine (NORVASC) 5 MG tablet TAKE ONE TABLET BY MOUTH EVERYDAY AT BEDTIME   aspirin EC 81 MG tablet Take 81 mg by mouth daily. Swallow whole.   calcium carbonate (OSCAL) 1500 (600 Ca) MG TABS tablet Take 600 mg of elemental calcium by mouth daily with breakfast.   diclofenac Sodium (VOLTAREN) 1 % GEL apply TWO grams TO THE AFFECTED AREA(S) EVERY MORNING, AT NOON, AT bedtime   gabapentin (NEURONTIN) 300 MG capsule TAKE ONE CAPSULE BY MOUTH EVERYDAY AT BEDTIME   hydrochlorothiazide (HYDRODIURIL) 25 MG tablet Take 1 tablet (25 mg total) by mouth every other day.   potassium chloride SA (KLOR-CON M) 20 MEQ tablet Take 1 tablet (20 mEq total) by mouth every morning.   No facility-administered encounter medications on file as of 07/05/2022.    Care Gaps: COVID-19 Vaccine   Star Rating Drugs: None ID   Medication Fill Gaps: None ID  Reviewed chart for medication changes ahead of medication coordination call.  BP Readings from Last 3 Encounters:  06/01/22 128/70  09/18/21 132/74  01/08/21 120/62    Lab Results  Component Value Date   HGBA1C 5.4 06/08/2022     Patient obtains medications through Adherence Packaging  30 Days   Last adherence delivery included: Gabapentin 300 mg daily - Bedtime Amlodipine 5 mg one tablet daily - Bedtime Hydrochlorothiazide 25 mg one tablet every other day - Breakfast Potassium Cl ER 20 Meq one tablet daily - Breakfast Aspirin 81 mg  one tablet daily- Vial Calcium Carbonate + D 600 mg twice daily- Vial  Patient declined medications last month: Voltaren 1%  Gel PRN Use -  Adequate supply  Patient is due for next adherence delivery on: 07/15/2022. Called patient and reviewed medications and coordinated delivery.  This delivery to include: Gabapentin 300 mg daily - Bedtime Amlodipine 5 mg one tablet daily - Bedtime Hydrochlorothiazide 25 mg one tablet every other day - Breakfast Potassium Cl ER 20 Meq one tablet daily - Breakfast Aspirin 81 mg  one tablet daily- Vial Calcium Carbonate + D 600 mg twice daily- Vials   Patient declined the following medications  Voltaren 1%  Gel PRN Use - Adequate supply  Patient needs refills for None ID.  Confirmed delivery date of 07/15/2022 (Second route), advised patient that pharmacy will contact them the morning of delivery.  [place] Pharmacist Assistant [phone]

## 2022-07-27 ENCOUNTER — Ambulatory Visit (INDEPENDENT_AMBULATORY_CARE_PROVIDER_SITE_OTHER): Payer: Medicare HMO

## 2022-07-27 VITALS — Ht 65.0 in | Wt 163.0 lb

## 2022-07-27 DIAGNOSIS — Z0000 Encounter for general adult medical examination without abnormal findings: Secondary | ICD-10-CM | POA: Diagnosis not present

## 2022-07-27 NOTE — Progress Notes (Signed)
I connected with Ebony Mayer today by telephone and verified that I am speaking with the correct person using two identifiers. Location patient: home Location provider: work Persons participating in the virtual visit: Ebony Mayer, Ebony Ponder LPN.   I discussed the limitations, risks, security and privacy concerns of performing an evaluation and management service by telephone and the availability of in person appointments. I also discussed with the patient that there may be a patient responsible charge related to this service. The patient expressed understanding and verbally consented to this telephonic visit.    Interactive audio and video telecommunications were attempted between this provider and patient, however failed, due to patient having technical difficulties OR patient did not have access to video capability.  We continued and completed visit with audio only.     Vital signs may be patient reported or missing.  Subjective:   Ebony Mayer is a 76 y.o. female who presents for Medicare Annual (Subsequent) preventive examination.  Review of Systems     Cardiac Risk Factors include: advanced age (>63men, >30 women);dyslipidemia;hypertension     Objective:    Today's Vitals   07/27/22 1126  Weight: 163 lb (73.9 kg)  Height: 5\' 5"  (1.651 m)   Body mass index is 27.12 kg/m.     07/27/2022   11:29 AM 07/22/2021   10:44 AM 12/31/2020    8:29 PM 07/10/2020    2:21 PM 11/08/2018    9:11 AM 09/01/2018    8:09 PM 08/27/2018   12:16 PM  Advanced Directives  Does Patient Have a Medical Advance Directive? No Yes No No No No No  Does patient want to make changes to medical advance directive?  No - Patient declined       Would patient like information on creating a medical advance directive?    No - Patient declined No - Patient declined No - Patient declined No - Patient declined    Current Medications (verified) Outpatient Encounter Medications as of 07/27/2022   Medication Sig   amLODipine (NORVASC) 5 MG tablet TAKE ONE TABLET BY MOUTH EVERYDAY AT BEDTIME   aspirin EC 81 MG tablet Take 81 mg by mouth daily. Swallow whole.   calcium carbonate (OSCAL) 1500 (600 Ca) MG TABS tablet Take 600 mg of elemental calcium by mouth daily with breakfast.   diclofenac Sodium (VOLTAREN) 1 % GEL apply TWO grams TO THE AFFECTED AREA(S) EVERY MORNING, AT NOON, AT bedtime   gabapentin (NEURONTIN) 300 MG capsule TAKE ONE CAPSULE BY MOUTH EVERYDAY AT BEDTIME   hydrochlorothiazide (HYDRODIURIL) 25 MG tablet Take 1 tablet (25 mg total) by mouth every other day.   potassium chloride SA (KLOR-CON M) 20 MEQ tablet Take 1 tablet (20 mEq total) by mouth every morning.   No facility-administered encounter medications on file as of 07/27/2022.    Allergies (verified) Patient has no known allergies.   History: Past Medical History:  Diagnosis Date   Hypertension    Hypertension    Mixed hyperlipidemia 10/19/2017   Patellofemoral arthritis of left knee 07/08/2016   History reviewed. No pertinent surgical history. Family History  Problem Relation Age of Onset   Diabetes Mother    Heart attack Mother    Cancer Sister    Heart attack Brother    Breast cancer Neg Hx    Social History   Socioeconomic History   Marital status: Single    Spouse name: Not on file   Number of children: Not on file   Years of  education: Not on file   Highest education level: Not on file  Occupational History   Not on file  Tobacco Use   Smoking status: Never   Smokeless tobacco: Never  Vaping Use   Vaping Use: Never used  Substance and Sexual Activity   Alcohol use: No   Drug use: No   Sexual activity: Not Currently  Other Topics Concern   Not on file  Social History Narrative   Not on file   Social Determinants of Health   Financial Resource Strain: Low Risk  (07/27/2022)   Overall Financial Resource Strain (CARDIA)    Difficulty of Paying Living Expenses: Not hard at all   Food Insecurity: No Food Insecurity (07/27/2022)   Hunger Vital Sign    Worried About Running Out of Food in the Last Year: Never true    Ran Out of Food in the Last Year: Never true  Transportation Needs: No Transportation Needs (07/27/2022)   PRAPARE - Administrator, Civil Service (Medical): No    Lack of Transportation (Non-Medical): No  Physical Activity: Inactive (07/27/2022)   Exercise Vital Sign    Days of Exercise per Week: 0 days    Minutes of Exercise per Session: 0 min  Stress: No Stress Concern Present (07/27/2022)   [employer] of Occupational Health - Occupational Stress Questionnaire    Feeling of Stress : Not at all  Social Connections: Moderately Isolated (07/22/2021)   Social Connection and Isolation Panel [NHANES]    Frequency of Communication with Friends and Family: Twice a week    Frequency of Social Gatherings with Friends and Family: Twice a week    Attends Religious Services: 1 to 4 times per year    Active Member of [employer] or Organizations: No    Attends Engineer, structural: Never    Marital Status: Never married    Tobacco Counseling Counseling given: Not Answered   Clinical Intake:  Pre-visit preparation completed: Yes  Pain : No/denies pain     Nutritional Status: BMI 25 -29 Overweight Nutritional Risks: None Diabetes: No  How often do you need to have someone help you when you read instructions, pamphlets, or other written materials from your doctor or pharmacy?: 1 - Never  Diabetic? no  Interpreter Needed?: No  Information entered by :: NAllen LPN   Activities of Daily Living    07/27/2022   11:30 AM  In your present state of health, do you have any difficulty performing the following activities:  Hearing? 0  Vision? 0  Difficulty concentrating or making decisions? 0  Walking or climbing stairs? 0  Dressing or bathing? 0  Doing errands, shopping? 0  Preparing Food and eating ? N  Using the Toilet?  N  In the past six months, have you accidently leaked urine? N  Do you have problems with loss of bowel control? N  Managing your Medications? N  Managing your Finances? N  Housekeeping or managing your Housekeeping? N    Patient Care Team: Nche, Bonna Gains, NP as PCP - General (Internal Medicine) Gaspar Cola, [facility] as Pharmacist (Pharmacist)  Indicate any recent Medical Services you may have received from other than [facility] providers in the past year (date may be approximate).     Assessment:   This is a routine wellness examination for Ebony Mayer.  Hearing/Vision screen Vision Screening - Comments:: No regular eye exams,  Dietary issues and exercise activities discussed: Current Exercise Habits: The patient does not participate  in regular exercise at present   Goals Addressed             This Visit's Progress    Patient Stated       07/27/2022, no goals       Depression Screen    07/27/2022   11:30 AM 07/22/2021   10:46 AM 07/22/2021   10:42 AM 07/10/2020    2:23 PM 01/07/2020   11:20 AM 11/08/2018    9:13 AM 10/19/2017   10:05 AM  PHQ 2/9 Scores  PHQ - 2 Score 0 0 0 0 0 0 0    Fall Risk    07/27/2022   11:30 AM 06/01/2022   10:02 AM 07/22/2021   10:45 AM 01/08/2021   11:13 AM 07/10/2020    2:22 PM  Fall Risk   Falls in the past year? 0 0 0 0 0  Number falls in past yr: 0 0 0 0 0  Injury with Fall? 0 0 0 0 0  Risk for fall due to : Medication side effect   No Fall Risks   Follow up Falls prevention discussed;Education provided;Falls evaluation completed  Falls evaluation completed Falls evaluation completed Falls prevention discussed    FALL RISK PREVENTION PERTAINING TO THE HOME:  Any stairs in or around the home? Yes  If so, are there any without handrails? No  Home free of loose throw rugs in walkways, pet beds, electrical cords, etc? Yes  Adequate lighting in your home to reduce risk of falls? Yes   ASSISTIVE DEVICES UTILIZED TO PREVENT  FALLS:  Life alert? No  Use of a cane, walker or w/c? No  Grab bars in the bathroom? No  Shower chair or bench in shower? No  Elevated toilet seat or a handicapped toilet? No   TIMED UP AND GO:  Was the test performed? No .      Cognitive Function:    11/08/2018    9:15 AM  MMSE - Mini Mental State Exam  Orientation to time 5  Orientation to Place 5  Registration 3  Attention/ Calculation 5  Attention/Calculation-comments pt does not do well with spelling or math  Recall 0  Language- name 2 objects 2  Language- repeat 1  Language- follow 3 step command 3  Language- read & follow direction 1  Write a sentence 0  Copy design 0  Total score 25        07/27/2022   11:31 AM  6CIT Screen  What Year? 0 points  What month? 0 points  What time? 0 points  Count back from 20 0 points  Months in reverse 0 points  Repeat phrase 2 points  Total Score 2 points    Immunizations Immunization History  Administered Date(s) Administered   PFIZER(Purple Top)SARS-COV-2 Vaccination 12/20/2019, 01/14/2020   Pneumococcal Conjugate-13 07/09/2020    TDAP status: Due, Education has been provided regarding the importance of this vaccine. Advised may receive this vaccine at local pharmacy or Health Dept. Aware to provide a copy of the vaccination record if obtained from local pharmacy or Health Dept. Verbalized acceptance and understanding.  Flu Vaccine status: Declined, Education has been provided regarding the importance of this vaccine but patient still declined. Advised may receive this vaccine at local pharmacy or Health Dept. Aware to provide a copy of the vaccination record if obtained from local pharmacy or Health Dept. Verbalized acceptance and understanding.  Pneumococcal vaccine status: Declined,  Education has been provided regarding the importance of this  vaccine but patient still declined. Advised may receive this vaccine at local pharmacy or Health Dept. Aware to provide a  copy of the vaccination record if obtained from local pharmacy or Health Dept. Verbalized acceptance and understanding.   Covid-19 vaccine status: Completed vaccines  Qualifies for Shingles Vaccine? Yes   Zostavax completed No   Shingrix Completed?: No.    Education has been provided regarding the importance of this vaccine. Patient has been advised to call insurance company to determine out of pocket expense if they have not yet received this vaccine. Advised may also receive vaccine at local pharmacy or Health Dept. Verbalized acceptance and understanding.  Screening Tests Health Maintenance  Topic Date Due   COVID-19 Vaccine (3 - Pfizer series) 03/10/2020   Medicare Annual Wellness (AWV)  07/22/2022   Zoster Vaccines- Shingrix (1 of 2) 08/31/2022 (Originally 04/08/1997)   Pneumonia Vaccine 1165+ Years old (2 - PPSV23 or PCV20) 09/18/2022 (Originally 07/09/2021)   INFLUENZA VACCINE  12/12/2022 (Originally 04/13/2022)   TETANUS/TDAP  06/02/2023 (Originally [birth year]1966)   MAMMOGRAM  02/27/2023   Fecal DNA (Cologuard)  09/30/2024   DEXA SCAN  Completed   Hepatitis C Screening  Completed   HPV VACCINES  Aged Out    Health Maintenance  Health Maintenance Due  Topic Date Due   COVID-19 Vaccine (3 - Pfizer series) 03/10/2020   Medicare Annual Wellness (AWV)  07/22/2022    Colorectal cancer screening: Type of screening: Cologuard. Completed 09/30/2021. Repeat every 3 years  Mammogram status: Completed 02/26/2022. Repeat every year  Bone Density status: Completed 07/16/2016.   Lung Cancer Screening: (Low Dose CT Chest recommended if Age 81-80 years, 30 pack-year currently smoking OR have quit w/in 15years.) does not qualify.   Lung Cancer Screening Referral: no  Additional Screening:  Hepatitis C Screening: does qualify; Completed 05/31/2016  Vision Screening: Recommended annual ophthalmology exams for early detection of glaucoma and other disorders of the eye. Is the patient up to date  with their annual eye exam?  No  Who is the provider or what is the name of the office in which the patient attends annual eye exams? none If pt is not established with a provider, would they like to be referred to a provider to establish care? No .   Dental Screening: Recommended annual dental exams for proper oral hygiene  Community Resource Referral / Chronic Care Management: CRR required this visit?  No   CCM required this visit?  No      Plan:     I have personally reviewed and noted the following in the patient's chart:   Medical and social history Use of alcohol, tobacco or illicit drugs  Current medications and supplements including opioid prescriptions. Patient is not currently taking opioid prescriptions. Functional ability and status Nutritional status Physical activity Advanced directives List of other physicians Hospitalizations, surgeries, and ER visits in previous 12 months Vitals Screenings to include cognitive, depression, and falls Referrals and appointments  In addition, I have reviewed and discussed with patient certain preventive protocols, quality metrics, and best practice recommendations. A written personalized care plan for preventive services as well as general preventive health recommendations were provided to patient.     Barb Merinoickeah E Mary-Anne Polizzi, LPN   [id number]/10/960411/14/2023   Nurse Notes: none  Due to this being a virtual visit, the after visit summary with patients personalized plan was offered to patient via mail or my-chart.  to pick up at office at next visit

## 2022-07-27 NOTE — Patient Instructions (Signed)
[name] , Thank you for taking time to come for your Medicare Wellness Visit. I appreciate your ongoing commitment to your health goals. Please review the following plan we discussed and let me know if I can assist you in the future.   Screening recommendations/referrals: Colonoscopy: cologuard 09/30/2021, due 09/30/2024 Mammogram: completed 02/26/2022, due 02/28/2023 Bone Density: completed 07/16/2016 Recommended yearly ophthalmology/optometry visit for glaucoma screening and checkup Recommended yearly dental visit for hygiene and checkup  Vaccinations: Influenza vaccine: decline Pneumococcal vaccine: decline Tdap vaccine: decline Shingles vaccine: decline   Covid-19: 01/14/2020, 12/20/2019  Advanced directives: Advance directive discussed with you today.   Conditions/risks identified: none  Next appointment: Follow up in one year for your annual wellness visit    Preventive Care 65 Years and Older, Female Preventive care refers to lifestyle choices and visits with your health care provider that can promote health and wellness. What does preventive care include? A yearly physical exam. This is also called an annual well check. Dental exams once or twice a year. Routine eye exams. Ask your health care provider how often you should have your eyes checked. Personal lifestyle choices, including: Daily care of your teeth and gums. Regular physical activity. Eating a healthy diet. Avoiding tobacco and drug use. Limiting alcohol use. Practicing safe sex. Taking low-dose aspirin every day. Taking vitamin and mineral supplements as recommended by your health care provider. What happens during an annual well check? The services and screenings done by your health care provider during your annual well check will depend on your age, overall health, lifestyle risk factors, and family history of disease. Counseling  Your health care provider may ask you questions about your: Alcohol  use. Tobacco use. Drug use. Emotional well-being. Home and relationship well-being. Sexual activity. Eating habits. History of falls. Memory and ability to understand (cognition). Work and work Astronomer. Reproductive health. Screening  You may have the following tests or measurements: Height, weight, and BMI. Blood pressure. Lipid and cholesterol levels. These may be checked every 5 years, or more frequently if you are over 42 years old. Skin check. Lung cancer screening. You may have this screening every year starting at age 69 if you have a 30-pack-year history of smoking and currently smoke or have quit within the past 15 years. Fecal occult blood test (FOBT) of the stool. You may have this test every year starting at age 58. Flexible sigmoidoscopy or colonoscopy. You may have a sigmoidoscopy every 5 years or a colonoscopy every 10 years starting at age 52. Hepatitis C blood test. Hepatitis B blood test. Sexually transmitted disease (STD) testing. Diabetes screening. This is done by checking your blood sugar (glucose) after you have not eaten for a while (fasting). You may have this done every 1-3 years. Bone density scan. This is done to screen for osteoporosis. You may have this done starting at age 74. Mammogram. This may be done every 1-2 years. Talk to your health care provider about how often you should have regular mammograms. Talk with your health care provider about your test results, treatment options, and if necessary, the need for more tests. Vaccines  Your health care provider may recommend certain vaccines, such as: Influenza vaccine. This is recommended every year. Tetanus, diphtheria, and acellular pertussis (Tdap, Td) vaccine. You may need a Td booster every 10 years. Zoster vaccine. You may need this after age 17. Pneumococcal 13-valent conjugate (PCV13) vaccine. One dose is recommended after age 3. Pneumococcal polysaccharide (PPSV23) vaccine. One dose is  recommended after age 63. Talk to your health care provider about which screenings and vaccines you need and how often you need them. This information is not intended to replace advice given to you by your health care provider. Make sure you discuss any questions you have with your health care provider. Document Released: 09/26/2015 Document Revised: 05/19/2016 Document Reviewed: 07/01/2015 Elsevier Interactive Patient Education  2017 [place] Prevention in the Home Falls can cause injuries. They can happen to people of all ages. There are many things you can do to make your home safe and to help prevent falls. What can I do on the outside of my home? Regularly fix the edges of walkways and driveways and fix any cracks. Remove anything that might make you trip as you walk through a door, such as a raised step or threshold. Trim any bushes or trees on the path to your home. Use bright outdoor lighting. Clear any walking paths of anything that might make someone trip, such as rocks or tools. Regularly check to see if handrails are loose or broken. Make sure that both sides of any steps have handrails. Any raised decks and porches should have guardrails on the edges. Have any leaves, snow, or ice cleared regularly. Use sand or salt on walking paths during winter. Clean up any spills in your garage right away. This includes oil or grease spills. What can I do in the bathroom? Use night lights. Install grab bars by the toilet and in the tub and shower. Do not use towel bars as grab bars. Use non-skid mats or decals in the tub or shower. If you need to sit down in the shower, use a plastic, non-slip stool. Keep the floor dry. Clean up any water that spills on the floor as soon as it happens. Remove soap buildup in the tub or shower regularly. Attach bath mats securely with double-sided non-slip rug tape. Do not have throw rugs and other things on the floor that can make you  trip. What can I do in the bedroom? Use night lights. Make sure that you have a light by your bed that is easy to reach. Do not use any sheets or blankets that are too big for your bed. They should not hang down onto the floor. Have a firm chair that has side arms. You can use this for support while you get dressed. Do not have throw rugs and other things on the floor that can make you trip. What can I do in the kitchen? Clean up any spills right away. Avoid walking on wet floors. Keep items that you use a lot in easy-to-reach places. If you need to reach something above you, use a strong step stool that has a grab bar. Keep electrical cords out of the way. Do not use floor polish or wax that makes floors slippery. If you must use wax, use non-skid floor wax. Do not have throw rugs and other things on the floor that can make you trip. What can I do with my stairs? Do not leave any items on the stairs. Make sure that there are handrails on both sides of the stairs and use them. Fix handrails that are broken or loose. Make sure that handrails are as long as the stairways. Check any carpeting to make sure that it is firmly attached to the stairs. Fix any carpet that is loose or worn. Avoid having throw rugs at the top or bottom of the stairs. If you  do have throw rugs, attach them to the floor with carpet tape. Make sure that you have a light switch at the top of the stairs and the bottom of the stairs. If you do not have them, ask someone to add them for you. What else can I do to help prevent falls? Wear shoes that: Do not have high heels. Have rubber bottoms. Are comfortable and fit you well. Are closed at the toe. Do not wear sandals. If you use a stepladder: Make sure that it is fully opened. Do not climb a closed stepladder. Make sure that both sides of the stepladder are locked into place. Ask someone to hold it for you, if possible. Clearly mark and make sure that you can  see: Any grab bars or handrails. First and last steps. Where the edge of each step is. Use tools that help you move around (mobility aids) if they are needed. These include: Canes. Walkers. Scooters. Crutches. Turn on the lights when you go into a dark area. Replace any light bulbs as soon as they burn out. Set up your furniture so you have a clear path. Avoid moving your furniture around. If any of your floors are uneven, fix them. If there are any pets around you, be aware of where they are. Review your medicines with your doctor. Some medicines can make you feel dizzy. This can increase your chance of falling. Ask your doctor what other things that you can do to help prevent falls. This information is not intended to replace advice given to you by your health care provider. Make sure you discuss any questions you have with your health care provider. Document Released: 06/26/2009 Document Revised: 02/05/2016 Document Reviewed: 10/04/2014 Elsevier Interactive Patient Education  2017 [employer].

## 2022-07-30 ENCOUNTER — Telehealth: Payer: Self-pay

## 2022-07-30 NOTE — Progress Notes (Signed)
    Chronic Care Management Pharmacy Assistant   Name: Ebony Mayer  MRN: [id number] DOB: 01-22-1946  Reason for Encounter: Medication Review/Medication Coordination Call.   Recent office visits:  07/27/2022 Elisha Ponder LPN (PCP Office) Medicare Wellness completed, no medication Changes noted  Recent consult visits:  None ID  Hospital visits:  None in previous 6 months  Medications: Outpatient Encounter Medications as of 07/30/2022  Medication Sig   amLODipine (NORVASC) 5 MG tablet TAKE ONE TABLET BY MOUTH EVERYDAY AT BEDTIME   aspirin EC 81 MG tablet Take 81 mg by mouth daily. Swallow whole.   calcium carbonate (OSCAL) 1500 (600 Ca) MG TABS tablet Take 600 mg of elemental calcium by mouth daily with breakfast.   diclofenac Sodium (VOLTAREN) 1 % GEL apply TWO grams TO THE AFFECTED AREA(S) EVERY MORNING, AT NOON, AT bedtime   gabapentin (NEURONTIN) 300 MG capsule TAKE ONE CAPSULE BY MOUTH EVERYDAY AT BEDTIME   hydrochlorothiazide (HYDRODIURIL) 25 MG tablet Take 1 tablet (25 mg total) by mouth every other day.   potassium chloride SA (KLOR-CON M) 20 MEQ tablet Take 1 tablet (20 mEq total) by mouth every morning.   No facility-administered encounter medications on file as of 07/30/2022.    Care Gaps: COVID-19 Vaccine   Star Rating Drugs: None ID   Medication Fill Gaps: None ID  Reviewed chart for medication changes ahead of medication coordination call.   BP Readings from Last 3 Encounters:  06/01/22 128/70  09/18/21 132/74  01/08/21 120/62    Lab Results  Component Value Date   HGBA1C 5.4 06/08/2022     Patient obtains medications through Adherence Packaging  30 Days   Last adherence delivery included:  Gabapentin 300 mg daily - Bedtime Amlodipine 5 mg one tablet daily - Bedtime Hydrochlorothiazide 25 mg one tablet every other day - Breakfast Potassium Cl ER 20 Meq one tablet daily - Breakfast Aspirin 81 mg  one tablet daily- Vial Calcium Carbonate + D  600 mg twice daily- Vials  Patient declined medications  last month: Voltaren 1%  Gel PRN Use - Adequate supply  Patient is due for next adherence delivery on: 08/13/2022. Called patient and reviewed medications and coordinated delivery.  This delivery to include: Gabapentin 300 mg daily - Bedtime Amlodipine 5 mg one tablet daily - Bedtime Hydrochlorothiazide 25 mg one tablet every other day - Breakfast Potassium Cl ER 20 Meq one tablet daily - Breakfast Aspirin 81 mg  one tablet daily- Vial Calcium Carbonate + D 600 mg twice daily- Vials  Patient declined the following medications: Voltaren 1%  Gel PRN Use - Adequate supply  Patient needs refills for None ID.  Confirmed delivery date of 08/13/2022 ([facility]), advised patient that pharmacy will contact them the morning of delivery.  Everlean Cherry Clinical Pharmacist Assistant [phone]

## 2022-08-06 IMAGING — MG MM DIGITAL SCREENING BILAT W/ TOMO AND CAD
6 of 10 series · 6 of 30 positions shown · non-contrast
Comparison: Previous exam(s).

CLINICAL DATA: Screening.

EXAM:
DIGITAL SCREENING BILATERAL MAMMOGRAM WITH TOMOSYNTHESIS AND CAD
TECHNIQUE: Bilateral screening digital craniocaudal and mediolateral oblique
mammograms were obtained. Bilateral screening digital breast
tomosynthesis was performed. The images were evaluated with
computer-aided detection.

[L MLO synth-2D]
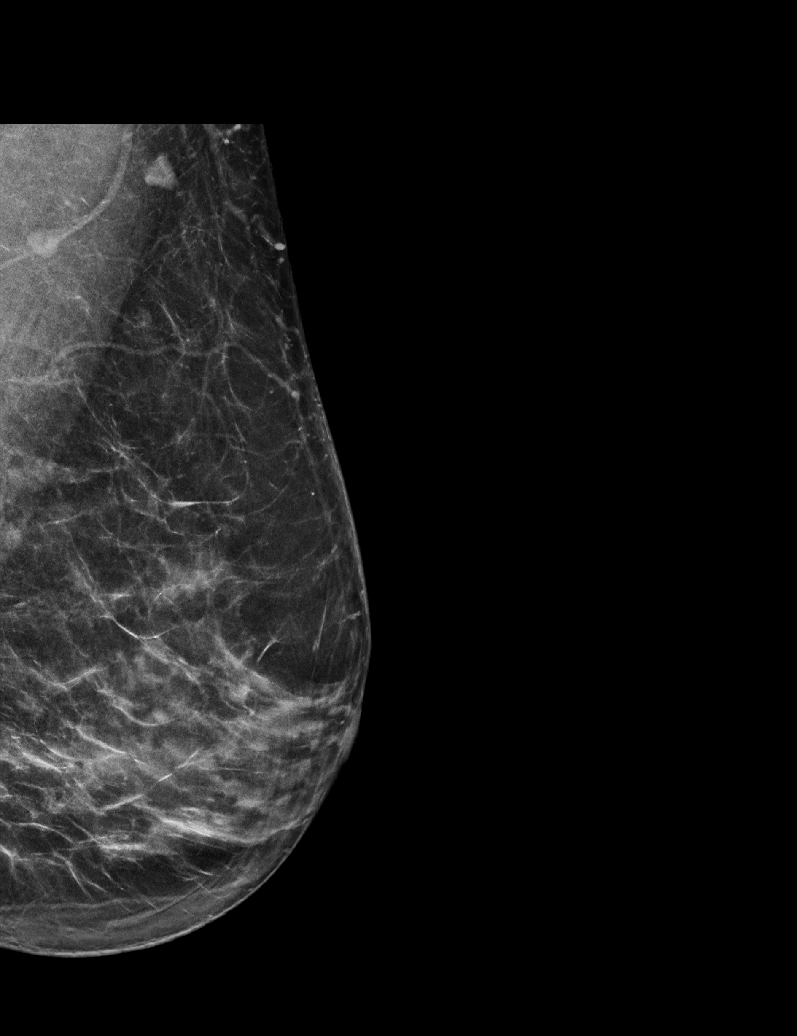

[L CC synth-2D (1 of 2)]
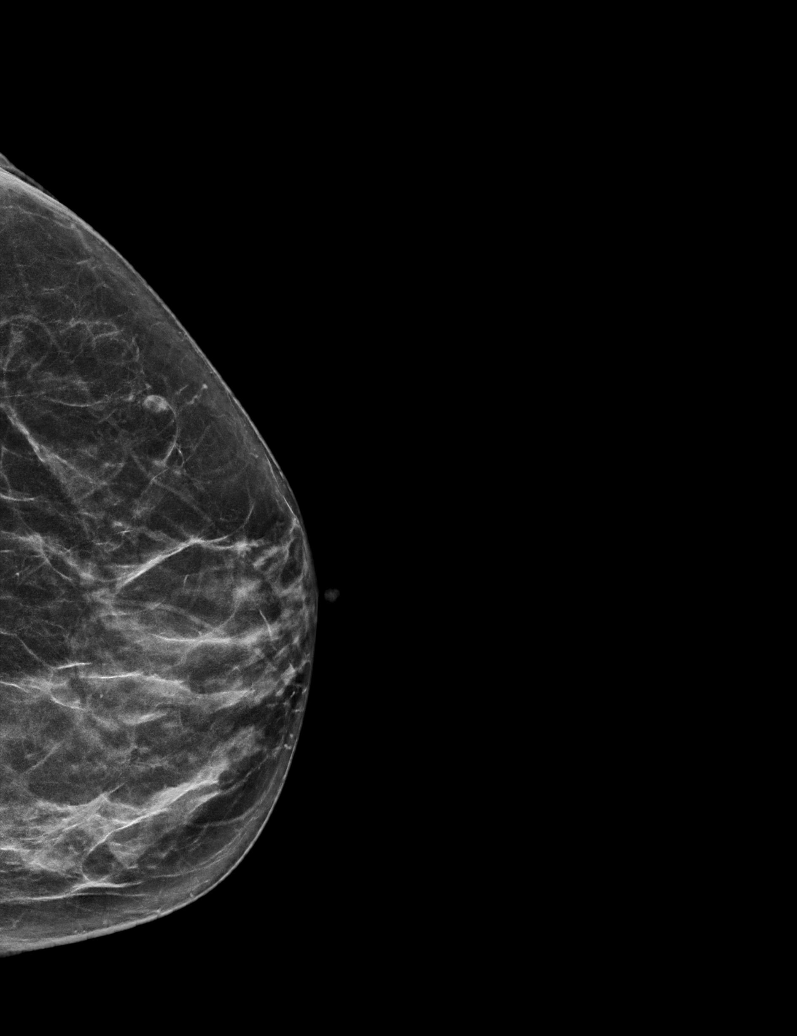

[R CC synth-2D]
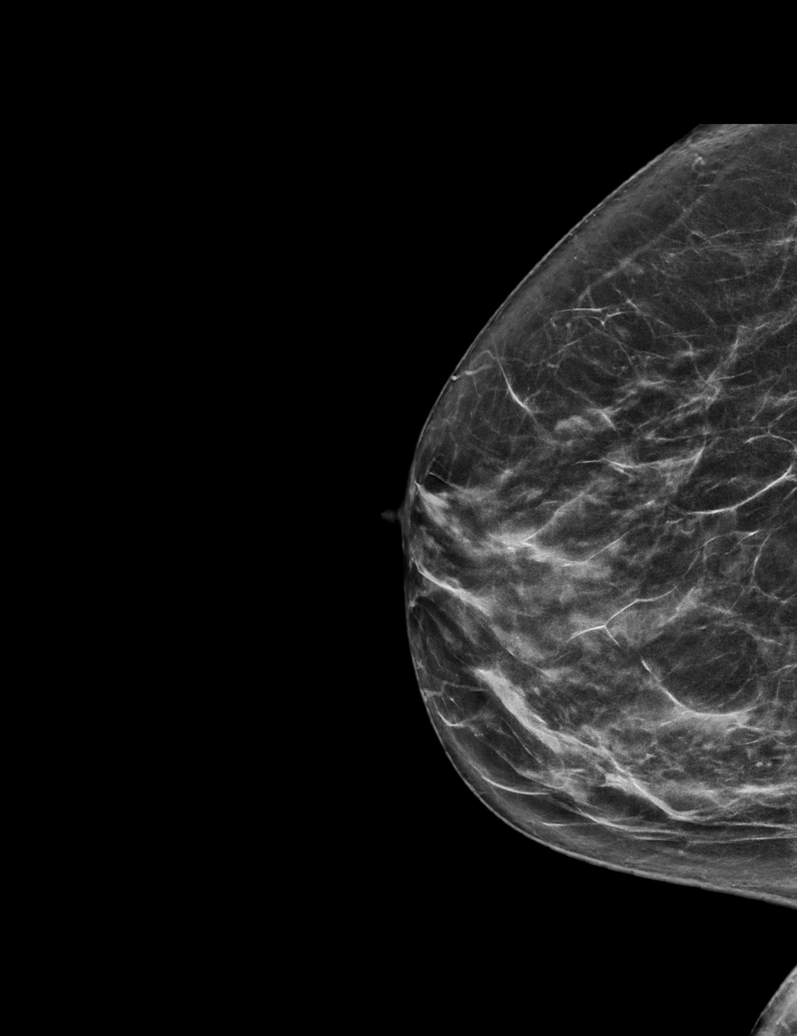

[L CC synth-2D (2 of 2)]
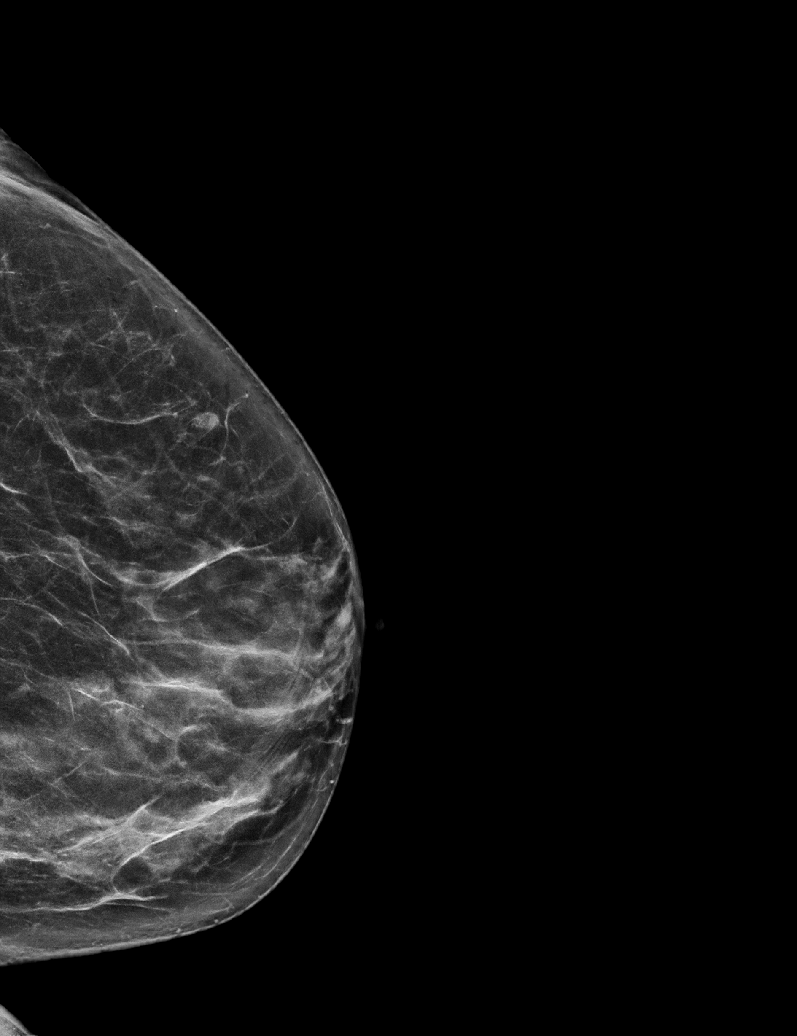

[R MLO synth-2D]
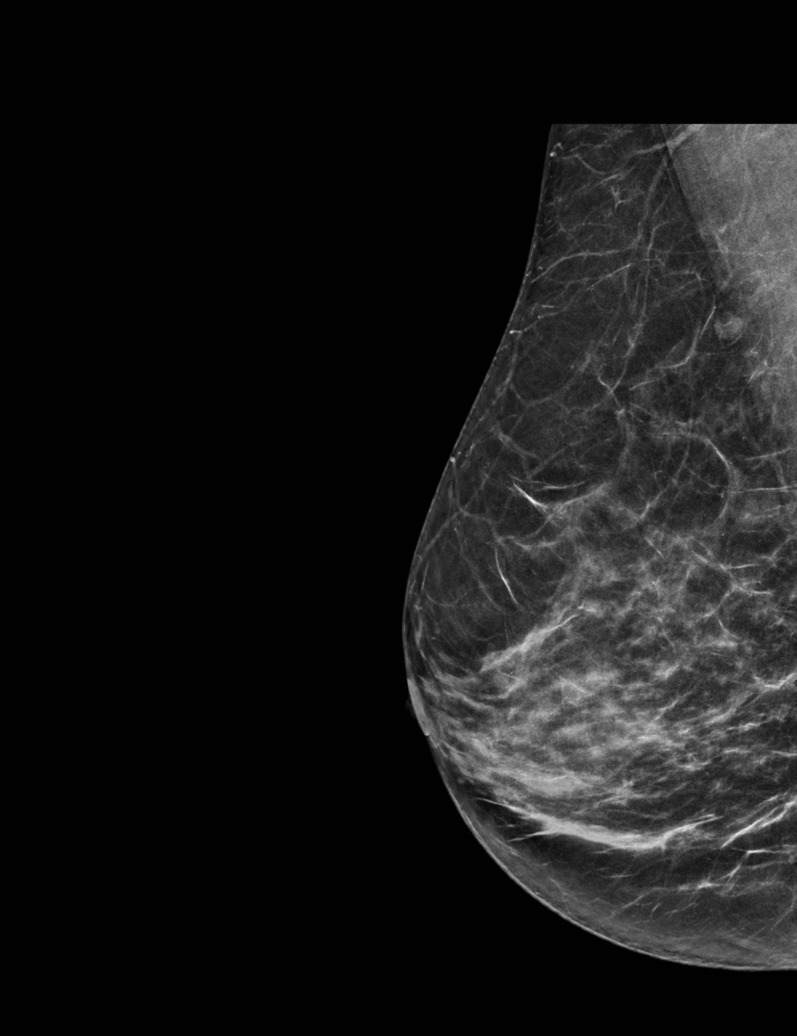

[L CC tomo · tomo slice 31/61.0]
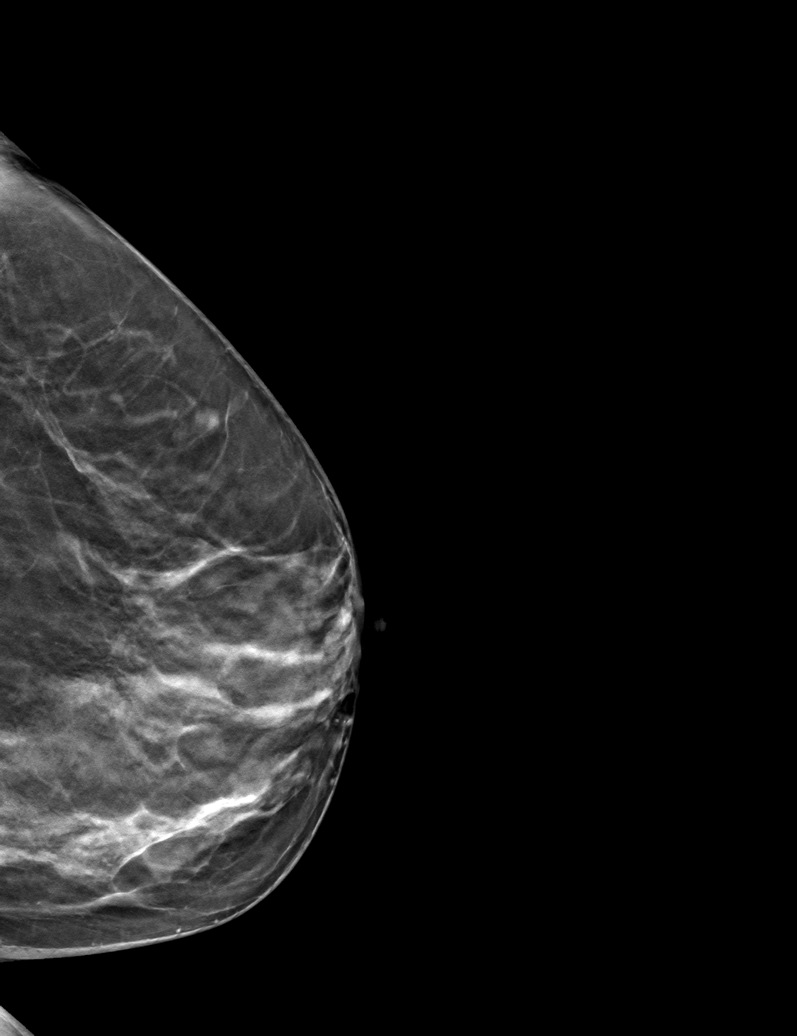

[6 of 30 positions shown; findings below may reference images not displayed]

ACR Breast Density Category b: There are scattered areas of
fibroglandular density.
FINDINGS: There are no findings suspicious for malignancy.
IMPRESSION: No mammographic evidence of malignancy. A result letter of this
screening mammogram will be mailed directly to the patient.

RECOMMENDATION:
Screening mammogram in one year. (Code:51-O-LD2)

BI-RADS CATEGORY  1: Negative.

## 2022-08-31 ENCOUNTER — Telehealth: Payer: Self-pay

## 2022-08-31 NOTE — Progress Notes (Signed)
    Chronic Care Management Pharmacy Assistant   Name: Ebony Mayer  MRN: [id number] DOB: 11/22/1945  Reason for Encounter: Medication Review/Medication Coordination call.   Recent office visits:  None ID  Recent consult visits:  None ID  Hospital visits:  None in previous 6 months  Medications: Outpatient Encounter Medications as of 08/31/2022  Medication Sig   amLODipine (NORVASC) 5 MG tablet TAKE ONE TABLET BY MOUTH EVERYDAY AT BEDTIME   aspirin EC 81 MG tablet Take 81 mg by mouth daily. Swallow whole.   calcium carbonate (OSCAL) 1500 (600 Ca) MG TABS tablet Take 600 mg of elemental calcium by mouth daily with breakfast.   diclofenac Sodium (VOLTAREN) 1 % GEL apply TWO grams TO THE AFFECTED AREA(S) EVERY MORNING, AT NOON, AT bedtime   gabapentin (NEURONTIN) 300 MG capsule TAKE ONE CAPSULE BY MOUTH EVERYDAY AT BEDTIME   hydrochlorothiazide (HYDRODIURIL) 25 MG tablet Take 1 tablet (25 mg total) by mouth every other day.   potassium chloride SA (KLOR-CON M) 20 MEQ tablet Take 1 tablet (20 mEq total) by mouth every morning.   No facility-administered encounter medications on file as of 08/31/2022.    Care Gaps: COVID-19 Vaccine Dtap Vaccine   Star Rating Drugs: None ID   Medication Fill Gaps: None ID  Reviewed chart for medication changes ahead of medication coordination call.   BP Readings from Last 3 Encounters:  06/01/22 128/70  09/18/21 132/74  01/08/21 120/62    Lab Results  Component Value Date   HGBA1C 5.4 06/08/2022     Patient obtains medications through Adherence Packaging  30 Days   Last adherence delivery included:  Gabapentin 300 mg daily - Bedtime Amlodipine 5 mg one tablet daily - Bedtime Hydrochlorothiazide 25 mg one tablet every other day - Breakfast Potassium Cl ER 20 Meq one tablet daily - Breakfast Aspirin 81 mg  one tablet daily- Vial Calcium Carbonate + D 600 mg twice daily- Vials  Patient declined medications last month: Voltaren  1%  Gel PRN Use - Adequate supply   Patient is due for next adherence delivery on: 09/13/2022. Called patient and reviewed medications and coordinated delivery.  This delivery to include: Gabapentin 300 mg daily - Bedtime Amlodipine 5 mg one tablet daily - Bedtime Hydrochlorothiazide 25 mg one tablet every other day - Breakfast Potassium Cl ER 20 Meq one tablet daily - Breakfast Aspirin 81 mg  one tablet daily- Vial Calcium Carbonate + D 600 mg twice daily- Vials  Patient declined the following medications: Voltaren 1%  Gel PRN Use - Adequate supply   Patient needs refills for None ID.  Confirmed delivery date of 09/13/2022 ([facility]), advised patient that pharmacy will contact them the morning of delivery.  Everlean Cherry Clinical Pharmacist Assistant [phone]

## 2022-10-01 ENCOUNTER — Telehealth: Payer: Self-pay

## 2022-10-01 NOTE — Progress Notes (Signed)
Care Management & Coordination Services Pharmacy Team  Reason for Encounter: Medication coordination and delivery  Contacted patient on 10/01/2022 to discuss medications   Recent office visits:  None ID  Recent consult visits:  None ID  Hospital visits:  None in previous 6 months  Medications: Outpatient Encounter Medications as of 10/01/2022  Medication Sig   amLODipine (NORVASC) 5 MG tablet TAKE ONE TABLET BY MOUTH EVERYDAY AT BEDTIME   aspirin EC 81 MG tablet Take 81 mg by mouth daily. Swallow whole.   calcium carbonate (OSCAL) 1500 (600 Ca) MG TABS tablet Take 600 mg of elemental calcium by mouth daily with breakfast.   diclofenac Sodium (VOLTAREN) 1 % GEL apply TWO grams TO THE AFFECTED AREA(S) EVERY MORNING, AT NOON, AT bedtime   gabapentin (NEURONTIN) 300 MG capsule TAKE ONE CAPSULE BY MOUTH EVERYDAY AT BEDTIME   hydrochlorothiazide (HYDRODIURIL) 25 MG tablet Take 1 tablet (25 mg total) by mouth every other day.   potassium chloride SA (KLOR-CON M) 20 MEQ tablet Take 1 tablet (20 mEq total) by mouth every morning.   No facility-administered encounter medications on file as of 10/01/2022.   BP Readings from Last 3 Encounters:  06/01/22 128/70  09/18/21 132/74  01/08/21 120/62    Pulse Readings from Last 3 Encounters:  06/01/22 98  09/18/21 100  01/08/21 99    Lab Results  Component Value Date/Time   HGBA1C 5.4 06/08/2022 09:04 AM   HGBA1C 5.4 09/18/2021 10:35 AM   Lab Results  Component Value Date   CREATININE 1.11 06/01/2022   BUN 13 06/01/2022   GFR 48.75 (L) 06/01/2022   GFRNONAA >60 12/31/2020   GFRAA >60 08/27/2018   NA 140 06/01/2022   K 3.5 06/01/2022   CALCIUM 9.4 06/01/2022   CO2 30 06/01/2022   Care Gaps: COVID-19 Vaccine Dtap Vaccine Shingrix Vaccine Pneumonia Vaccine   Star Rating Drugs: None ID  Last adherence delivery date:09/13/2022      Patient is due for next adherence delivery on: 10/13/2022  Spoke with patient on 10/01/2022  reviewed medications and coordinated delivery.  This delivery to include: Adherence Packaging  30 Days  Gabapentin 300 mg daily - Bedtime Amlodipine 5 mg one tablet daily - Bedtime Hydrochlorothiazide 25 mg one tablet every other day - Breakfast Potassium Cl ER 20 Meq one tablet daily - Breakfast Aspirin 81 mg  one tablet daily- Vial Calcium Carbonate + D 600 mg twice daily- Vials Voltaren 1%  Gel PRN   Patient declined the following medications this month: None ID  No refill request needed.  Confirmed delivery date of 10/13/2022 ([facility]), advised patient that pharmacy will contact them the morning of delivery.   Any concerns about your medications? No  How often do you forget or accidentally miss a dose? Never  Do you use a pillbox? No  Is patient in packaging Yes    Recent blood pressure readings are as follows:None ID  Recent blood glucose readings are as follows:None ID   Cycle dispensing form sent to Daron Offer for review.   [place] Pharmacist Assistant [phone]

## 2022-10-07 ENCOUNTER — Other Ambulatory Visit: Payer: Self-pay | Admitting: Nurse Practitioner

## 2022-10-07 DIAGNOSIS — M79605 Pain in left leg: Secondary | ICD-10-CM

## 2022-10-07 DIAGNOSIS — M7632 Iliotibial band syndrome, left leg: Secondary | ICD-10-CM

## 2022-10-07 NOTE — Telephone Encounter (Signed)
Chart supports Rx Last OV: 05/2022 Next OV: 07/2023

## 2022-12-06 ENCOUNTER — Other Ambulatory Visit: Payer: Self-pay | Admitting: Nurse Practitioner

## 2022-12-06 DIAGNOSIS — I10 Essential (primary) hypertension: Secondary | ICD-10-CM

## 2022-12-06 DIAGNOSIS — E876 Hypokalemia: Secondary | ICD-10-CM

## 2023-03-10 ENCOUNTER — Other Ambulatory Visit: Payer: Self-pay | Admitting: Nurse Practitioner

## 2023-03-10 DIAGNOSIS — Z1231 Encounter for screening mammogram for malignant neoplasm of breast: Secondary | ICD-10-CM

## 2023-03-16 ENCOUNTER — Ambulatory Visit
Admission: RE | Admit: 2023-03-16 | Discharge: 2023-03-16 | Disposition: A | Discharge status: Home / Self Care | Payer: Medicare HMO | Source: Ambulatory Visit | Attending: Nurse Practitioner | Admitting: Nurse Practitioner

## 2023-03-16 DIAGNOSIS — Z1231 Encounter for screening mammogram for malignant neoplasm of breast: Secondary | ICD-10-CM | POA: Diagnosis not present

## 2023-04-14 ENCOUNTER — Ambulatory Visit: Payer: Medicare HMO | Admitting: Nurse Practitioner

## 2023-04-20 ENCOUNTER — Ambulatory Visit (INDEPENDENT_AMBULATORY_CARE_PROVIDER_SITE_OTHER): Payer: Medicare HMO | Admitting: Nurse Practitioner

## 2023-04-20 ENCOUNTER — Encounter: Payer: Self-pay | Admitting: Nurse Practitioner

## 2023-04-20 VITALS — BP 130/80 | HR 104 | Temp 98.5°F | Resp 16 | Ht 65.0 in | Wt 167.0 lb

## 2023-04-20 DIAGNOSIS — T502X5A Adverse effect of carbonic-anhydrase inhibitors, benzothiadiazides and other diuretics, initial encounter: Secondary | ICD-10-CM | POA: Diagnosis not present

## 2023-04-20 DIAGNOSIS — M7632 Iliotibial band syndrome, left leg: Secondary | ICD-10-CM

## 2023-04-20 DIAGNOSIS — Z23 Encounter for immunization: Secondary | ICD-10-CM | POA: Diagnosis not present

## 2023-04-20 DIAGNOSIS — M2550 Pain in unspecified joint: Secondary | ICD-10-CM

## 2023-04-20 DIAGNOSIS — M79605 Pain in left leg: Secondary | ICD-10-CM

## 2023-04-20 DIAGNOSIS — Z780 Asymptomatic menopausal state: Secondary | ICD-10-CM

## 2023-04-20 DIAGNOSIS — E876 Hypokalemia: Secondary | ICD-10-CM

## 2023-04-20 DIAGNOSIS — E782 Mixed hyperlipidemia: Secondary | ICD-10-CM

## 2023-04-20 DIAGNOSIS — I10 Essential (primary) hypertension: Secondary | ICD-10-CM | POA: Diagnosis not present

## 2023-04-20 LAB — TSH: TSH: 1.17 u[IU]/mL (ref 0.35–5.50)

## 2023-04-20 LAB — COMPREHENSIVE METABOLIC PANEL
ALT: 12 U/L (ref 0–35)
AST: 15 U/L (ref 0–37)
Albumin: 4.1 g/dL (ref 3.5–5.2)
Alkaline Phosphatase: 54 U/L (ref 39–117)
BUN: 12 mg/dL (ref 6–23)
CO2: 29 mEq/L (ref 19–32)
Calcium: 9.9 mg/dL (ref 8.4–10.5)
Chloride: 104 mEq/L (ref 96–112)
Creatinine, Ser: 0.96 mg/dL (ref 0.40–1.20)
GFR: 57.67 mL/min — ABNORMAL LOW (ref >60.00)
Glucose, Bld: 96 mg/dL (ref 70–99)
Potassium: 4.2 mEq/L (ref 3.5–5.1)
Sodium: 140 mEq/L (ref 135–145)
Total Bilirubin: 0.4 mg/dL (ref 0.2–1.2)
Total Protein: 6.9 g/dL (ref 6.0–8.3)

## 2023-04-20 LAB — CBC
HCT: 41.7 % (ref 36.0–46.0)
Hemoglobin: 13.4 g/dL (ref 12.0–15.0)
MCHC: 32.1 g/dL (ref 30.0–36.0)
MCV: 92.4 fl (ref 78.0–100.0)
Platelets: 152 10*3/uL (ref 150.0–400.0)
RBC: 4.51 Mil/uL (ref 3.87–5.11)
RDW: 12.6 % (ref 11.5–15.5)
WBC: 3.3 10*3/uL — ABNORMAL LOW (ref 4.0–10.5)

## 2023-04-20 MED ORDER — POTASSIUM CHLORIDE CRYS ER 20 MEQ PO TBCR
20.0000 meq | EXTENDED_RELEASE_TABLET | Freq: Every morning | ORAL | 1 refills | Status: DC
Start: 2023-04-20 — End: 2023-08-09
  Filled 2023-05-17: qty 90, 90d supply, fill #0

## 2023-04-20 MED ORDER — AMLODIPINE BESYLATE 5 MG PO TABS
5.0000 mg | ORAL_TABLET | Freq: Every day | ORAL | 3 refills | Status: DC
Start: 2023-04-20 — End: 2023-08-09
  Filled 2023-05-17: qty 90, 90d supply, fill #0

## 2023-04-20 MED ORDER — GABAPENTIN 300 MG PO CAPS
300.0000 mg | ORAL_CAPSULE | Freq: Every day | ORAL | 3 refills | Status: DC
Start: 2023-04-20 — End: 2023-08-09
  Filled 2023-05-17: qty 90, 90d supply, fill #0

## 2023-04-20 MED ORDER — HYDROCHLOROTHIAZIDE 25 MG PO TABS
25.0000 mg | ORAL_TABLET | ORAL | 1 refills | Status: DC
Start: 2023-04-20 — End: 2023-08-09
  Filled 2023-05-17: qty 45, 90d supply, fill #0

## 2023-04-20 NOTE — Patient Instructions (Addendum)
Take vit D 1000IU daily with calcium supplement Use tylenol 500-650mg  every 8hrs as needed for pain Go to lab Continue Heart healthy diet and daily exercise(yoga and chair exercise). Maintain current medications.

## 2023-04-20 NOTE — Assessment & Plan Note (Signed)
No DIABETES, no tobacco use, no ALCOHOL use, no hx of CAD or PAD BP at goal Repeat lipid panel Advised about importance of DASH diet

## 2023-04-20 NOTE — Assessment & Plan Note (Signed)
BP at goal with amlodipine and HCTZ BP Readings from Last 3 Encounters:  04/20/23 130/80  06/01/22 128/70  09/18/21 132/74   Repeat CMP Maintain med dose F/up in 6months

## 2023-04-20 NOTE — Progress Notes (Signed)
Established Patient Visit  Patient: Ebony Mayer   DOB: 1946-05-25   77 y.o. Female  MRN: [id number] Visit Date: 04/20/2023  Subjective:    Chief Complaint  Patient presents with   Hypertension   HPI Essential hypertension, benign BP at goal with amlodipine and HCTZ BP Readings from Last 3 Encounters:  04/20/23 130/80  06/01/22 128/70  09/18/21 132/74   Repeat CMP Maintain med dose F/up in 6months  Mixed hyperlipidemia No DIABETES, no tobacco use, no ALCOHOL use, no hx of CAD or PAD BP at goal Repeat lipid panel Advised about importance of DASH diet  Wt Readings from Last 3 Encounters:  04/20/23 167 lb (75.8 kg)  07/27/22 163 lb (73.9 kg)  06/01/22 163 lb 3.2 oz (74 kg)    Reviewed medical, surgical, and social history today  Medications: Outpatient Medications Prior to Visit  Medication Sig   aspirin EC 81 MG tablet Take 81 mg by mouth daily. Swallow whole.   calcium carbonate (OSCAL) 1500 (600 Ca) MG TABS tablet Take 600 mg of elemental calcium by mouth daily with breakfast.   diclofenac Sodium (VOLTAREN) 1 % GEL apply TWO grams TO THE AFFECTED AREA(S) EVERY MORNING, AT NOON, AT bedtime   [DISCONTINUED] amLODipine (NORVASC) 5 MG tablet TAKE ONE TABLET BY MOUTH EVERYDAY AT BEDTIME   [DISCONTINUED] gabapentin (NEURONTIN) 300 MG capsule TAKE ONE CAPSULE BY MOUTH EVERYDAY AT BEDTIME   [DISCONTINUED] hydrochlorothiazide (HYDRODIURIL) 25 MG tablet Take 1 tablet (25 mg total) by mouth every other day.   [DISCONTINUED] potassium chloride SA (KLOR-CON M) 20 MEQ tablet TAKE ONE TABLET BY MOUTH EVERY MORNING   No facility-administered medications prior to visit.   Reviewed past medical and social history.   ROS per HPI above      Objective:  BP 130/80 (BP Location: Left Arm, Patient Position: Sitting, Cuff Size: Normal)   Pulse (!) 104   Temp 98.5 F (36.9 C) (Oral)   Resp 16   Ht 5\' 5"  (1.651 m)   Wt 167 lb (75.8 kg)   SpO2 99%   BMI 27.79 kg/m       Physical Exam Vitals and nursing note reviewed.  Cardiovascular:     Rate and Rhythm: Normal rate and regular rhythm.     Pulses: Normal pulses.     Heart sounds: Normal heart sounds.  Pulmonary:     Effort: Pulmonary effort is normal.     Breath sounds: Normal breath sounds.  Musculoskeletal:     Right lower leg: No edema.     Left lower leg: No edema.  Neurological:     Mental Status: She is alert and oriented to person, place, and time.  Psychiatric:        Mood and Affect: Mood normal.        Behavior: Behavior normal.        Thought Content: Thought content normal.     Results for orders placed or performed in visit on 04/20/23  Comprehensive metabolic panel  Result Value Ref Range   Sodium 140 135 - 145 mEq/L   Potassium 4.2 3.5 - 5.1 mEq/L   Chloride 104 96 - 112 mEq/L   CO2 29 19 - 32 mEq/L   Glucose, Bld 96 70 - 99 mg/dL   BUN 12 6 - 23 mg/dL   Creatinine, Ser 8.29 0.40 - 1.20 mg/dL   Total Bilirubin 0.4 0.2 - 1.2 mg/dL  Alkaline Phosphatase 54 39 - 117 U/L   AST 15 0 - 37 U/L   ALT 12 0 - 35 U/L   Total Protein 6.9 6.0 - 8.3 g/dL   Albumin 4.1 3.5 - 5.2 g/dL   GFR 29.52 (L) >84.13 mL/min   Calcium 9.9 8.4 - 10.5 mg/dL  TSH  Result Value Ref Range   TSH 1.17 0.35 - 5.50 uIU/mL  CBC  Result Value Ref Range   WBC 3.3 (L) 4.0 - 10.5 K/uL   RBC 4.51 3.87 - 5.11 Mil/uL   Platelets 152.0 150.0 - 400.0 K/uL   Hemoglobin 13.4 12.0 - 15.0 g/dL   HCT 24.4 01.0 - 27.2 %   MCV 92.4 78.0 - 100.0 fl   MCHC 32.1 30.0 - 36.0 g/dL   RDW 53.6 64.4 - 03.4 %      Assessment & Plan:    Problem List Items Addressed This Visit       Cardiovascular and Mediastinum   Essential hypertension, benign - Primary    BP at goal with amlodipine and HCTZ BP Readings from Last 3 Encounters:  04/20/23 130/80  06/01/22 128/70  09/18/21 132/74   Repeat CMP Maintain med dose F/up in 6months      Relevant Medications   amLODipine (NORVASC) 5 MG tablet    hydrochlorothiazide (HYDRODIURIL) 25 MG tablet   potassium chloride SA (KLOR-CON M) 20 MEQ tablet   Other Relevant Orders   Comprehensive metabolic panel (Completed)   TSH (Completed)   CBC (Completed)     Musculoskeletal and Integument   It band syndrome, left   Relevant Medications   gabapentin (NEURONTIN) 300 MG capsule     Other   Leg pain, lateral   Relevant Medications   gabapentin (NEURONTIN) 300 MG capsule   Mixed hyperlipidemia    No DIABETES, no tobacco use, no ALCOHOL use, no hx of CAD or PAD BP at goal Repeat lipid panel Advised about importance of DASH diet      Relevant Medications   amLODipine (NORVASC) 5 MG tablet   hydrochlorothiazide (HYDRODIURIL) 25 MG tablet   Other Relevant Orders   Comprehensive metabolic panel (Completed)   Other Visit Diagnoses     Arthralgia, unspecified joint       Relevant Orders   Comprehensive metabolic panel (Completed)   TSH (Completed)   CBC (Completed)   Asymptomatic postmenopausal estrogen deficiency       Relevant Orders   DG Bone Density   Immunization due       Relevant Orders   Pneumococcal conjugate vaccine 20-valent (Prevnar 20) (Completed)   Diuretic-induced hypokalemia       Relevant Medications   potassium chloride SA (KLOR-CON M) 20 MEQ tablet      Return in about 6 months (around 10/21/2023) for HTN, hyperlipidemia (fasting).     [name], NP

## 2023-05-17 ENCOUNTER — Other Ambulatory Visit (HOSPITAL_COMMUNITY): Payer: Self-pay

## 2023-05-17 ENCOUNTER — Other Ambulatory Visit: Payer: Self-pay

## 2023-05-17 MED ORDER — HYDROCHLOROTHIAZIDE 25 MG PO TABS
25.0000 mg | ORAL_TABLET | ORAL | 1 refills | Status: DC
Start: 2022-06-09 — End: 2023-08-09
  Filled 2023-05-17: qty 45, 90d supply, fill #0

## 2023-05-17 MED FILL — Diclofenac Sodium Gel 1% (1.16% Diethylamine Equiv): CUTANEOUS | 16 days supply | Qty: 100 | Fill #0 | Status: CN

## 2023-05-18 ENCOUNTER — Other Ambulatory Visit (HOSPITAL_COMMUNITY): Payer: Self-pay

## 2023-05-18 ENCOUNTER — Other Ambulatory Visit: Payer: Self-pay | Admitting: Nurse Practitioner

## 2023-05-18 ENCOUNTER — Other Ambulatory Visit: Payer: Self-pay

## 2023-05-18 MED ORDER — CALCIUM CARB-CHOLECALCIFEROL 600-10 MG-MCG PO TABS: 1.0000 | ORAL_TABLET | Freq: Every morning | ORAL | 99 refills | Status: DC

## 2023-05-18 MED ORDER — ASPIRIN 81 MG PO TBEC: 81.0000 mg | DELAYED_RELEASE_TABLET | Freq: Every morning | ORAL | 99 refills | Status: DC

## 2023-05-19 ENCOUNTER — Other Ambulatory Visit: Payer: Self-pay

## 2023-05-23 ENCOUNTER — Other Ambulatory Visit (HOSPITAL_COMMUNITY): Payer: Self-pay

## 2023-06-30 ENCOUNTER — Other Ambulatory Visit (HOSPITAL_COMMUNITY): Payer: Self-pay

## 2023-06-30 ENCOUNTER — Other Ambulatory Visit: Payer: Self-pay | Admitting: Nurse Practitioner

## 2023-06-30 NOTE — Telephone Encounter (Signed)
Please advise if ok to refill  Last Ov 04/20/23

## 2023-07-04 ENCOUNTER — Other Ambulatory Visit (HOSPITAL_COMMUNITY): Payer: Self-pay

## 2023-07-12 ENCOUNTER — Other Ambulatory Visit (HOSPITAL_COMMUNITY): Payer: Self-pay

## 2023-08-02 ENCOUNTER — Other Ambulatory Visit (HOSPITAL_COMMUNITY): Payer: Self-pay

## 2023-08-08 ENCOUNTER — Ambulatory Visit (INDEPENDENT_AMBULATORY_CARE_PROVIDER_SITE_OTHER): Payer: Medicare HMO

## 2023-08-08 DIAGNOSIS — Z0000 Encounter for general adult medical examination without abnormal findings: Secondary | ICD-10-CM

## 2023-08-08 NOTE — Patient Instructions (Signed)
Ebony Mayer , Thank you for taking time to come for your Medicare Wellness Visit. I appreciate your ongoing commitment to your health goals. Please review the following plan we discussed and let me know if I can assist you in the future.   Referrals/Orders/Follow-Ups/Clinician Recommendations: none  This is a list of the screening recommended for you and due dates:  Health Maintenance  Topic Date Due   Zoster (Shingles) Vaccine (1 of 2) Never done   COVID-19 Vaccine (3 - 2023-24 season) 05/15/2023   Flu Shot  12/12/2023*   Mammogram  03/15/2024   Medicare Annual Wellness Visit  08/07/2024   Pneumonia Vaccine  Completed   DEXA scan (bone density measurement)  Completed   Hepatitis C Screening  Completed   HPV Vaccine  Aged Out   DTaP/Tdap/Td vaccine  Discontinued   Cologuard (Stool DNA test)  Discontinued  *Topic was postponed. The date shown is not the original due date.    Advanced directives: (ACP Link)Information on Advanced Care Planning can be found at [facility] Advance Health Care Directives Advance Health Care Directives ([email or web address]   Next Medicare Annual Wellness Visit scheduled for next year: Yes  Insert Preventive Care attachment Insert FALL PREVENTION attachment if needed

## 2023-08-08 NOTE — Progress Notes (Signed)
Subjective:   Lovenia Loebig is a 77 y.o. female who presents for Medicare Annual (Subsequent) preventive examination.  Visit Complete: Virtual I connected with  Karys Cerone on 08/08/23 by a audio enabled telemedicine application and verified that I am speaking with the correct person using two identifiers.  Patient Location: Home  Provider Location: Office/Clinic  I discussed the limitations of evaluation and management by telemedicine. The patient expressed understanding and agreed to proceed.  Vital Signs: Because this visit was a virtual/telehealth visit, some criteria may be missing or patient reported. Any vitals not documented were not able to be obtained and vitals that have been documented are patient reported.    Cardiac Risk Factors include: advanced age (>72men, >77 women);dyslipidemia;hypertension     Objective:    Today's Vitals   There is no height or weight on file to calculate BMI.     08/08/2023    8:52 AM 07/27/2022   11:29 AM 07/22/2021   10:44 AM 12/31/2020    8:29 PM 07/10/2020    2:21 PM 11/08/2018    9:11 AM 09/01/2018    8:09 PM  Advanced Directives  Does Patient Have a Medical Advance Directive? No No Yes No No No No  Does patient want to make changes to medical advance directive?   No - Patient declined      Would patient like information on creating a medical advance directive?     No - Patient declined No - Patient declined No - Patient declined    Current Medications (verified) Outpatient Encounter Medications as of 08/08/2023  Medication Sig   amLODipine (NORVASC) 5 MG tablet Take 1 tablet (5 mg total) by mouth at bedtime.   diclofenac Sodium (VOLTAREN) 1 % GEL apply TWO grams TO THE AFFECTED AREA(S) EVERY MORNING, AT NOON, AT bedtime   gabapentin (NEURONTIN) 300 MG capsule Take 1 capsule (300 mg total) by mouth at bedtime.   hydrochlorothiazide (HYDRODIURIL) 25 MG tablet Take 1 tablet (25 mg total) by mouth every other day.   potassium  chloride SA (KLOR-CON M) 20 MEQ tablet Take 1 tablet (20 mEq total) by mouth every morning.   aspirin EC 81 MG tablet Take 81 mg by mouth daily. Swallow whole. (Patient not taking: Reported on 08/08/2023)   aspirin EC 81 MG tablet Take 1 tablet (81 mg total) by mouth every morning. (Patient not taking: Reported on 08/08/2023)   Calcium Carb-Cholecalciferol 600-10 MG-MCG TABS Take 1 tablet by mouth in the morning. (Patient not taking: Reported on 08/08/2023)   calcium carbonate (OSCAL) 1500 (600 Ca) MG TABS tablet Take 600 mg of elemental calcium by mouth daily with breakfast. (Patient not taking: Reported on 08/08/2023)   hydrochlorothiazide (HYDRODIURIL) 25 MG tablet Take 1 tablet (25 mg total) by mouth every other day. (Patient not taking: Reported on 08/08/2023)   No facility-administered encounter medications on file as of 08/08/2023.    Allergies (verified) Patient has no known allergies.   History: Past Medical History:  Diagnosis Date   Hypertension    Hypertension    Mixed hyperlipidemia 10/19/2017   Patellofemoral arthritis of left knee 07/08/2016   History reviewed. No pertinent surgical history. Family History  Problem Relation Age of Onset   Diabetes Mother    Heart attack Mother    Cancer Sister    Heart attack Brother    Breast cancer Neg Hx    Social History   Socioeconomic History   Marital status: Single    Spouse name: Not  on file   Number of children: Not on file   Years of education: Not on file   Highest education level: Not on file  Occupational History   Not on file  Tobacco Use   Smoking status: Never   Smokeless tobacco: Never  Vaping Use   Vaping status: Never Used  Substance and Sexual Activity   Alcohol use: No   Drug use: No   Sexual activity: Not Currently  Other Topics Concern   Not on file  Social History Narrative   Not on file   Social Determinants of Health   Financial Resource Strain: Low Risk  (08/08/2023)   Overall Financial  Resource Strain (CARDIA)    Difficulty of Paying Living Expenses: Not hard at all  Food Insecurity: No Food Insecurity (08/08/2023)   Hunger Vital Sign    Worried About Running Out of Food in the Last Year: Never true    Ran Out of Food in the Last Year: Never true  Transportation Needs: No Transportation Needs (08/08/2023)   PRAPARE - Administrator, Civil Service (Medical): No    Lack of Transportation (Non-Medical): No  Physical Activity: Inactive (08/08/2023)   Exercise Vital Sign    Days of Exercise per Week: 0 days    Minutes of Exercise per Session: 0 min  Stress: No Stress Concern Present (08/08/2023)   [employer] of Occupational Health - Occupational Stress Questionnaire    Feeling of Stress : Not at all  Social Connections: Socially Isolated (08/08/2023)   Social Connection and Isolation Panel [NHANES]    Frequency of Communication with Friends and Family: More than three times a week    Frequency of Social Gatherings with Friends and Family: Not on file    Attends Religious Services: Never    Database administrator or Organizations: No    Attends Engineer, structural: Never    Marital Status: Never married    Tobacco Counseling Counseling given: Not Answered   Clinical Intake:  Pre-visit preparation completed: Yes  Pain : No/denies pain     Nutritional Risks: None Diabetes: No  How often do you need to have someone help you when you read instructions, pamphlets, or other written materials from your doctor or pharmacy?: 1 - Never  Interpreter Needed?: No  Information entered by :: NAllen LPN   Activities of Daily Living    08/08/2023    8:47 AM  In your present state of health, do you have any difficulty performing the following activities:  Hearing? 0  Vision? 0  Difficulty concentrating or making decisions? 0  Walking or climbing stairs? 0  Dressing or bathing? 0  Doing errands, shopping? 0  Preparing Food and eating ?  N  Using the Toilet? N  In the past six months, have you accidently leaked urine? N  Do you have problems with loss of bowel control? N  Managing your Medications? N  Managing your Finances? N  Housekeeping or managing your Housekeeping? N    Patient Care Team: Nche, Bonna Gains, NP as PCP - General (Internal Medicine) Gaspar Cola, [facility] (Inactive) as Pharmacist (Pharmacist)  Indicate any recent Medical Services you may have received from other than [facility] providers in the past year (date may be approximate).     Assessment:   This is a routine wellness examination for Mennie.  Hearing/Vision screen Hearing Screening - Comments:: Denies hearing issues Vision Screening - Comments:: No regular eye exams,   Goals  Addressed             This Visit's Progress    Patient Stated       08/08/2023, stay healthy       Depression Screen    08/08/2023    8:53 AM 07/27/2022   11:30 AM 07/22/2021   10:46 AM 07/22/2021   10:42 AM 07/10/2020    2:23 PM 01/07/2020   11:20 AM 11/08/2018    9:13 AM  PHQ 2/9 Scores  PHQ - 2 Score 0 0 0 0 0 0 0  PHQ- 9 Score 0          Fall Risk    08/08/2023    8:52 AM 07/27/2022   11:30 AM 06/01/2022   10:02 AM 07/22/2021   10:45 AM 01/08/2021   11:13 AM  Fall Risk   Falls in the past year? 0 0 0 0 0  Number falls in past yr: 0 0 0 0 0  Injury with Fall? 0 0 0 0 0  Risk for fall due to : Medication side effect Medication side effect   No Fall Risks  Follow up Falls prevention discussed;Falls evaluation completed Falls prevention discussed;Education provided;Falls evaluation completed  Falls evaluation completed Falls evaluation completed    MEDICARE RISK AT HOME: Medicare Risk at Home Any stairs in or around the home?: Yes If so, are there any without handrails?: No Home free of loose throw rugs in walkways, pet beds, electrical cords, etc?: Yes Adequate lighting in your home to reduce risk of falls?: Yes Life alert?: No Use of a  cane, walker or w/c?: No Grab bars in the bathroom?: Yes Shower chair or bench in shower?: No Elevated toilet seat or a handicapped toilet?: Yes  TIMED UP AND GO:  Was the test performed?  No    Cognitive Function:    11/08/2018    9:15 AM  MMSE - Mini Mental State Exam  Orientation to time 5  Orientation to Place 5  Registration 3  Attention/ Calculation 5  Attention/Calculation-comments pt does not do well with spelling or math  Recall 0  Language- name 2 objects 2  Language- repeat 1  Language- follow 3 step command 3  Language- read & follow direction 1  Write a sentence 0  Copy design 0  Total score 25        08/08/2023    8:53 AM 07/27/2022   11:31 AM  6CIT Screen  What Year? 0 points 0 points  What month? 0 points 0 points  What time? 0 points 0 points  Count back from 20 0 points 0 points  Months in reverse 0 points 0 points  Repeat phrase 2 points 2 points  Total Score 2 points 2 points    Immunizations Immunization History  Administered Date(s) Administered   PFIZER(Purple Top)SARS-COV-2 Vaccination 12/20/2019, 01/14/2020   PNEUMOCOCCAL CONJUGATE-20 04/20/2023   Pneumococcal Conjugate-13 07/09/2020    TDAP status: Up to date  Flu Vaccine status: Declined, Education has been provided regarding the importance of this vaccine but patient still declined. Advised may receive this vaccine at local pharmacy or Health Dept. Aware to provide a copy of the vaccination record if obtained from local pharmacy or Health Dept. Verbalized acceptance and understanding.  Pneumococcal vaccine status: Up to date  Covid-19 vaccine status: Information provided on how to obtain vaccines.   Qualifies for Shingles Vaccine? Yes   Zostavax completed No   Shingrix Completed?: No.    Education has been  provided regarding the importance of this vaccine. Patient has been advised to call insurance company to determine out of pocket expense if they have not yet received this  vaccine. Advised may also receive vaccine at local pharmacy or Health Dept. Verbalized acceptance and understanding.  Screening Tests Health Maintenance  Topic Date Due   Zoster Vaccines- Shingrix (1 of 2) Never done   COVID-19 Vaccine (3 - 2023-24 season) 05/15/2023   INFLUENZA VACCINE  12/12/2023 (Originally 04/14/2023)   MAMMOGRAM  03/15/2024   Medicare Annual Wellness (AWV)  08/07/2024   Pneumonia Vaccine 22+ Years old  Completed   DEXA SCAN  Completed   Hepatitis C Screening  Completed   HPV VACCINES  Aged Out   DTaP/Tdap/Td  Discontinued   Fecal DNA (Cologuard)  Discontinued    Health Maintenance  Health Maintenance Due  Topic Date Due   Zoster Vaccines- Shingrix (1 of 2) Never done   COVID-19 Vaccine (3 - 2023-24 season) 05/15/2023    Colorectal cancer screening: No longer required.   Mammogram status: Completed 03/16/2023. Repeat every year  Bone Density status: scheduled for 01/03/2024  Lung Cancer Screening: (Low Dose CT Chest recommended if Age 21-80 years, 20 pack-year currently smoking OR have quit w/in 15years.) does not qualify.   Lung Cancer Screening Referral: none  Additional Screening:  Hepatitis C Screening: does qualify; Completed 05/31/2016  Vision Screening: Recommended annual ophthalmology exams for early detection of glaucoma and other disorders of the eye. Is the patient up to date with their annual eye exam?  No  Who is the provider or what is the name of the office in which the patient attends annual eye exams? none If pt is not established with a provider, would they like to be referred to a provider to establish care? No .   Dental Screening: Recommended annual dental exams for proper oral hygiene  Diabetic Foot Exam: n/a  Community Resource Referral / Chronic Care Management: CRR required this visit?  No   CCM required this visit?  No     Plan:     I have personally reviewed and noted the following in the patient's chart:   Medical  and social history Use of alcohol, tobacco or illicit drugs  Current medications and supplements including opioid prescriptions. Patient is not currently taking opioid prescriptions. Functional ability and status Nutritional status Physical activity Advanced directives List of other physicians Hospitalizations, surgeries, and ER visits in previous 12 months Vitals Screenings to include cognitive, depression, and falls Referrals and appointments  In addition, I have reviewed and discussed with patient certain preventive protocols, quality metrics, and best practice recommendations. A written personalized care plan for preventive services as well as general preventive health recommendations were provided to patient.     Barb Merino, LPN   [id number]   After Visit Summary: (Pick Up) Due to this being a telephonic visit, with patients personalized plan was offered to patient and patient has requested to Pick up at office.  Nurse Notes: none

## 2023-08-09 ENCOUNTER — Other Ambulatory Visit: Payer: Self-pay | Admitting: Nurse Practitioner

## 2023-08-09 DIAGNOSIS — E876 Hypokalemia: Secondary | ICD-10-CM

## 2023-08-09 DIAGNOSIS — M7632 Iliotibial band syndrome, left leg: Secondary | ICD-10-CM

## 2023-08-09 DIAGNOSIS — I10 Essential (primary) hypertension: Secondary | ICD-10-CM

## 2023-08-09 DIAGNOSIS — M79605 Pain in left leg: Secondary | ICD-10-CM

## 2023-08-09 MED ORDER — AMLODIPINE BESYLATE 5 MG PO TABS: 5.0000 mg | ORAL_TABLET | Freq: Every day | ORAL | 3 refills | Status: DC

## 2023-08-09 MED ORDER — POTASSIUM CHLORIDE CRYS ER 20 MEQ PO TBCR: 20.0000 meq | EXTENDED_RELEASE_TABLET | Freq: Every morning | ORAL | 1 refills | Status: DC

## 2023-08-09 MED ORDER — HYDROCHLOROTHIAZIDE 25 MG PO TABS: 25.0000 mg | ORAL_TABLET | ORAL | 1 refills | Status: DC

## 2023-08-09 MED ORDER — GABAPENTIN 300 MG PO CAPS: 300.0000 mg | ORAL_CAPSULE | Freq: Every day | ORAL | 3 refills | Status: DC

## 2023-08-09 NOTE — Progress Notes (Signed)
Informed patient that RX request was sent to [facility] pharmacy. Patient verbalized understanding.

## 2023-08-09 NOTE — Progress Notes (Unsigned)
Sent meds to [facility] per patient's request

## 2023-08-19 ENCOUNTER — Other Ambulatory Visit (HOSPITAL_COMMUNITY): Payer: Self-pay

## 2023-11-01 ENCOUNTER — Ambulatory Visit: Payer: Medicare HMO | Admitting: Nurse Practitioner

## 2023-11-08 ENCOUNTER — Telehealth: Payer: Self-pay | Admitting: Nurse Practitioner

## 2023-11-08 NOTE — Telephone Encounter (Signed)
 04/14/2023 same day cancel 11/01/2023 no show  Final warning sent via mail & sent text message

## 2023-12-21 ENCOUNTER — Telehealth: Payer: Self-pay

## 2023-12-21 NOTE — Telephone Encounter (Signed)
 Returned call to patient informing her of location of bone density, patient verbalized understanding   Copied from CRM [id number]. Topic: General - Other >> Dec 21, 2023 11:12 AM Fredrich Romans wrote: Reason for CRM: Patient  [name] requesting a phone call to let her know which location she is going to to have her bone density scan done?

## 2024-01-03 ENCOUNTER — Ambulatory Visit
Admission: RE | Admit: 2024-01-03 | Discharge: 2024-01-03 | Disposition: A | Discharge status: Home / Self Care | Payer: Medicare HMO | Source: Ambulatory Visit | Attending: Nurse Practitioner | Admitting: Nurse Practitioner

## 2024-01-03 DIAGNOSIS — M8588 Other specified disorders of bone density and structure, other site: Secondary | ICD-10-CM | POA: Diagnosis not present

## 2024-01-03 DIAGNOSIS — Z780 Asymptomatic menopausal state: Secondary | ICD-10-CM

## 2024-01-03 DIAGNOSIS — N958 Other specified menopausal and perimenopausal disorders: Secondary | ICD-10-CM | POA: Diagnosis not present

## 2024-01-04 ENCOUNTER — Other Ambulatory Visit: Payer: Self-pay | Admitting: Nurse Practitioner

## 2024-01-04 DIAGNOSIS — E876 Hypokalemia: Secondary | ICD-10-CM

## 2024-01-04 DIAGNOSIS — I10 Essential (primary) hypertension: Secondary | ICD-10-CM

## 2024-01-10 ENCOUNTER — Ambulatory Visit (INDEPENDENT_AMBULATORY_CARE_PROVIDER_SITE_OTHER): Admitting: Nurse Practitioner

## 2024-01-10 ENCOUNTER — Encounter: Payer: Self-pay | Admitting: Nurse Practitioner

## 2024-01-10 VITALS — BP 138/74 | HR 94 | Temp 98.3°F | Ht 64.0 in | Wt 162.2 lb

## 2024-01-10 DIAGNOSIS — E782 Mixed hyperlipidemia: Secondary | ICD-10-CM

## 2024-01-10 DIAGNOSIS — Z1231 Encounter for screening mammogram for malignant neoplasm of breast: Secondary | ICD-10-CM

## 2024-01-10 DIAGNOSIS — I10 Essential (primary) hypertension: Secondary | ICD-10-CM | POA: Diagnosis not present

## 2024-01-10 DIAGNOSIS — N1831 Chronic kidney disease, stage 3a: Secondary | ICD-10-CM | POA: Diagnosis not present

## 2024-01-10 DIAGNOSIS — M7632 Iliotibial band syndrome, left leg: Secondary | ICD-10-CM | POA: Diagnosis not present

## 2024-01-10 DIAGNOSIS — M79605 Pain in left leg: Secondary | ICD-10-CM | POA: Diagnosis not present

## 2024-01-10 DIAGNOSIS — N183 Chronic kidney disease, stage 3 unspecified: Secondary | ICD-10-CM | POA: Insufficient documentation

## 2024-01-10 DIAGNOSIS — M8580 Other specified disorders of bone density and structure, unspecified site: Secondary | ICD-10-CM | POA: Insufficient documentation

## 2024-01-10 LAB — CBC
HCT: 42.4 % (ref 36.0–46.0)
Hemoglobin: 13.7 g/dL (ref 12.0–15.0)
MCHC: 32.2 g/dL (ref 30.0–36.0)
MCV: 93.8 fl (ref 78.0–100.0)
Platelets: 165 10*3/uL (ref 150.0–400.0)
RBC: 4.52 Mil/uL (ref 3.87–5.11)
RDW: 12.8 % (ref 11.5–15.5)
WBC: 4.3 10*3/uL (ref 4.0–10.5)

## 2024-01-10 LAB — HEMOGLOBIN A1C: Hgb A1c MFr Bld: 5.4 % (ref 4.6–6.5)

## 2024-01-10 LAB — RENAL FUNCTION PANEL
Albumin: 4.2 g/dL (ref 3.5–5.2)
BUN: 13 mg/dL (ref 6–23)
CO2: 32 meq/L (ref 19–32)
Calcium: 9.1 mg/dL (ref 8.4–10.5)
Chloride: 103 meq/L (ref 96–112)
Creatinine, Ser: 0.82 mg/dL (ref 0.40–1.20)
GFR: 69.32 mL/min (ref >60.00)
Glucose, Bld: 94 mg/dL (ref 70–99)
Phosphorus: 2.9 mg/dL (ref 2.3–4.6)
Potassium: 4 meq/L (ref 3.5–5.1)
Sodium: 140 meq/L (ref 135–145)

## 2024-01-10 LAB — LDL CHOLESTEROL, DIRECT: Direct LDL: 110 mg/dL

## 2024-01-10 MED ORDER — AMLODIPINE BESYLATE 5 MG PO TABS: 5.0000 mg | ORAL_TABLET | Freq: Every day | ORAL | 3 refills | Status: AC

## 2024-01-10 MED ORDER — GABAPENTIN 300 MG PO CAPS
300.0000 mg | ORAL_CAPSULE | Freq: Every day | ORAL | 0 refills | Status: DC
Start: 2024-01-10 — End: 2024-01-10

## 2024-01-10 MED ORDER — GABAPENTIN 300 MG PO CAPS: 300.0000 mg | ORAL_CAPSULE | Freq: Every day | ORAL | 3 refills | Status: AC

## 2024-01-10 MED ORDER — HYDROCHLOROTHIAZIDE 25 MG PO TABS: 25.0000 mg | ORAL_TABLET | ORAL | 1 refills | Status: DC

## 2024-01-10 MED ORDER — GABAPENTIN 300 MG PO CAPS: 300.0000 mg | ORAL_CAPSULE | Freq: Every day | ORAL | 0 refills | Status: DC

## 2024-01-10 NOTE — Patient Instructions (Addendum)
 Start calcium  1200mg  and vit. D3 800IU daily for bone health.  I placed an order for mammogram at [facility] [address], [address] [place], [place] [address]  Go to lab

## 2024-01-10 NOTE — Assessment & Plan Note (Signed)
 Pain is controlled with gabapentin  300mg  at hs and voltaren  gel prn

## 2024-01-10 NOTE — Assessment & Plan Note (Signed)
 BP at goal with amlodipine  5mg  in PM and hydrochlorothiazide  25mg  EOD BP Readings from Last 3 Encounters:  01/10/24 138/74  04/20/23 130/80  06/01/22 128/70   Repeat BMP Maintain med dose F/up in 6months

## 2024-01-10 NOTE — Progress Notes (Signed)
 Established Patient Visit  Patient: Ebony Mayer   DOB: 12/31/1945   78 y.o. Female  MRN: [id number] Visit Date: 01/10/2024  Subjective:    Chief Complaint  Patient presents with   Follow-up    6 month f/u   HPI Essential hypertension, benign BP at goal with amlodipine  5mg  in PM and hydrochlorothiazide  25mg  EOD BP Readings from Last 3 Encounters:  01/10/24 138/74  04/20/23 130/80  06/01/22 128/70   Repeat BMP Maintain med dose F/up in 6months  CKD (chronic kidney disease) stage 3, GFR 30-59 ml/min (HCC) BP at goal Check hgbA1c, BMP, UACr, and urine protein/creatinine ratio  Mixed hyperlipidemia Repeat direct LDL due to non fasting today  It band syndrome, left Pain is controlled with gabapentin  300mg  at hs and voltaren  gel prn   Reviewed medical, surgical, and social history today  Medications: Outpatient Medications Prior to Visit  Medication Sig   diclofenac  Sodium (VOLTAREN ) 1 % GEL apply TWO grams TO THE AFFECTED AREA(S) EVERY MORNING, AT NOON, AT bedtime   potassium chloride  SA (KLOR-CON  M) 20 MEQ tablet Take 1 tablet (20 mEq total) by mouth every morning.   [DISCONTINUED] amLODipine  (NORVASC ) 5 MG tablet Take 1 tablet (5 mg total) by mouth at bedtime.   [DISCONTINUED] gabapentin  (NEURONTIN ) 300 MG capsule Take 1 capsule (300 mg total) by mouth at bedtime.   [DISCONTINUED] hydrochlorothiazide  (HYDRODIURIL ) 25 MG tablet Take 1 tablet (25 mg total) by mouth every other day.   No facility-administered medications prior to visit.   Reviewed past medical and social history.   ROS per HPI above      Objective:  BP 138/74 (BP Location: Left Arm, Patient Position: Sitting, Cuff Size: Large)   Pulse 94   Temp 98.3 F (36.8 C) (Temporal)   Ht 5\' 4"  (1.626 m)   Wt 162 lb 3.2 oz (73.6 kg)   SpO2 98%   BMI 27.84 kg/m      Physical Exam Vitals and nursing note reviewed.  Cardiovascular:     Rate and Rhythm: Normal rate and regular rhythm.      Pulses: Normal pulses.     Heart sounds: Normal heart sounds.  Pulmonary:     Effort: Pulmonary effort is normal.     Breath sounds: Normal breath sounds.  Musculoskeletal:     Right lower leg: No edema.     Left lower leg: No edema.  Neurological:     Mental Status: She is alert and oriented to person, place, and time.     No results found for any visits on 01/10/24.    Assessment & Plan:    Problem List Items Addressed This Visit     CKD (chronic kidney disease) stage 3, GFR 30-59 ml/min (HCC)   BP at goal Check hgbA1c, BMP, UACr, and urine protein/creatinine ratio      Relevant Orders   Hemoglobin A1c   Microalbumin / creatinine urine ratio   Renal Function Panel   CBC   Protein / Creatinine Ratio, Urine   Essential hypertension, benign - Primary   BP at goal with amlodipine  5mg  in PM and hydrochlorothiazide  25mg  EOD BP Readings from Last 3 Encounters:  01/10/24 138/74  04/20/23 130/80  06/01/22 128/70   Repeat BMP Maintain med dose F/up in 6months      Relevant Medications   amLODipine  (NORVASC ) 5 MG tablet   hydrochlorothiazide  (HYDRODIURIL ) 25 MG tablet   It  band syndrome, left   Pain is controlled with gabapentin  300mg  at hs and voltaren  gel prn      Relevant Medications   gabapentin  (NEURONTIN ) 300 MG capsule   Leg pain, lateral   Relevant Medications   gabapentin  (NEURONTIN ) 300 MG capsule   Mixed hyperlipidemia   Repeat direct LDL due to non fasting today      Relevant Medications   amLODipine  (NORVASC ) 5 MG tablet   hydrochlorothiazide  (HYDRODIURIL ) 25 MG tablet   Other Relevant Orders   Direct LDL   Other Visit Diagnoses       Breast cancer screening by mammogram       Relevant Orders   MM 3D SCREENING MAMMOGRAM BILATERAL BREAST      Return in about 6 months (around 07/11/2024) for HTN, hyperlipidemia (fasting).     Kathrene Parents, NP

## 2024-01-10 NOTE — Assessment & Plan Note (Signed)
 Repeat direct LDL due to non fasting today

## 2024-01-10 NOTE — Assessment & Plan Note (Signed)
 BP at goal Check hgbA1c, BMP, UACr, and urine protein/creatinine ratio

## 2024-01-11 LAB — MICROALBUMIN / CREATININE URINE RATIO
Creatinine,U: 74.3 mg/dL
Microalb Creat Ratio: UNDETERMINED mg/g (ref 0.0–30.0)
Microalb, Ur: <0.7 mg/dL

## 2024-01-11 LAB — PROTEIN / CREATININE RATIO, URINE
Creatinine, Urine: 75 mg/dL (ref 20–275)
Protein/Creat Ratio: 53 mg/g{creat} (ref 24–184)
Protein/Creatinine Ratio: 0.053 mg/mg{creat} (ref 0.024–0.184)
Total Protein, Urine: 4 mg/dL — ABNORMAL LOW (ref 5–24)

## 2024-02-21 ENCOUNTER — Other Ambulatory Visit: Payer: Self-pay | Admitting: Nurse Practitioner

## 2024-02-21 DIAGNOSIS — E876 Hypokalemia: Secondary | ICD-10-CM

## 2024-02-21 DIAGNOSIS — I10 Essential (primary) hypertension: Secondary | ICD-10-CM

## 2024-05-08 DIAGNOSIS — R9431 Abnormal electrocardiogram [ECG] [EKG]: Secondary | ICD-10-CM | POA: Diagnosis not present

## 2024-05-08 DIAGNOSIS — Z79899 Other long term (current) drug therapy: Secondary | ICD-10-CM | POA: Diagnosis not present

## 2024-05-08 DIAGNOSIS — R000 Tachycardia, unspecified: Secondary | ICD-10-CM | POA: Diagnosis not present

## 2024-05-08 DIAGNOSIS — R42 Dizziness and giddiness: Secondary | ICD-10-CM | POA: Diagnosis not present

## 2024-05-08 DIAGNOSIS — M1990 Unspecified osteoarthritis, unspecified site: Secondary | ICD-10-CM | POA: Diagnosis not present

## 2024-05-08 DIAGNOSIS — I10 Essential (primary) hypertension: Secondary | ICD-10-CM | POA: Diagnosis not present

## 2024-05-20 DIAGNOSIS — I10 Essential (primary) hypertension: Secondary | ICD-10-CM | POA: Diagnosis not present

## 2024-05-20 DIAGNOSIS — M1990 Unspecified osteoarthritis, unspecified site: Secondary | ICD-10-CM | POA: Diagnosis not present

## 2024-05-20 DIAGNOSIS — N611 Abscess of the breast and nipple: Secondary | ICD-10-CM | POA: Diagnosis not present

## 2024-05-21 ENCOUNTER — Inpatient Hospital Stay: Admitting: Nurse Practitioner

## 2024-05-21 ENCOUNTER — Telehealth: Payer: Self-pay | Admitting: Nurse Practitioner

## 2024-05-21 NOTE — Telephone Encounter (Signed)
 11/01/2023 no show 98/2025 same day cancel/no transportation  Pt is rescheduled for 05/22/2024 1:40pm (approved by PCP).  Do you want me to send a final warning letter?

## 2024-05-22 ENCOUNTER — Encounter: Payer: Self-pay | Admitting: Nurse Practitioner

## 2024-05-22 ENCOUNTER — Ambulatory Visit (INDEPENDENT_AMBULATORY_CARE_PROVIDER_SITE_OTHER): Admitting: Nurse Practitioner

## 2024-05-22 VITALS — BP 132/76 | HR 99 | Temp 98.4°F | Ht 64.0 in | Wt 168.0 lb

## 2024-05-22 DIAGNOSIS — T502X5A Adverse effect of carbonic-anhydrase inhibitors, benzothiadiazides and other diuretics, initial encounter: Secondary | ICD-10-CM | POA: Diagnosis not present

## 2024-05-22 DIAGNOSIS — E876 Hypokalemia: Secondary | ICD-10-CM

## 2024-05-22 DIAGNOSIS — I10 Essential (primary) hypertension: Secondary | ICD-10-CM

## 2024-05-22 DIAGNOSIS — Z87898 Personal history of other specified conditions: Secondary | ICD-10-CM | POA: Diagnosis not present

## 2024-05-22 DIAGNOSIS — L0291 Cutaneous abscess, unspecified: Secondary | ICD-10-CM | POA: Diagnosis not present

## 2024-05-22 MED ORDER — POTASSIUM CHLORIDE CRYS ER 20 MEQ PO TBCR: 20.0000 meq | EXTENDED_RELEASE_TABLET | Freq: Every morning | ORAL | 1 refills | Status: AC

## 2024-05-22 MED ORDER — HYDROCHLOROTHIAZIDE 25 MG PO TABS: 25.0000 mg | ORAL_TABLET | ORAL | 1 refills | Status: AC

## 2024-05-22 NOTE — Assessment & Plan Note (Signed)
 BP at goal with amlodipine  5mg  in PM and hydrochlorothiazide  25mg  EOD BP Readings from Last 3 Encounters:  05/22/24 132/76  01/10/24 138/74  04/20/23 130/80   Maintain med dose F/up in 3months

## 2024-05-22 NOTE — Assessment & Plan Note (Signed)
 On mid chest wall x 1week, had ED visit 2days ago, I&D performed and doxycycline prescribed. Persistent purulent drainage noted with surrounding induration.  Advised to completed doxycyline and apply warm compress 3x/day. Return to office if no improvement in 10-14days

## 2024-05-22 NOTE — Assessment & Plan Note (Signed)
 She woke up feeling dizzy-unsteady on feet, no room spinning, no syncope, no confusion, on focal weakness, no paresthesia, no change in speech, no change in vision, no headache, This led  to an ED visit 2weeks ago. IV fluids administered and labs drawn per patient. Her symptoms resolved with IV fluids and oral hydration.   Records requested. Normal neuro exam today

## 2024-05-22 NOTE — Progress Notes (Signed)
 Established Patient Visit  Patient: Ebony Mayer   DOB: [birth year]   78 y.o. Female  MRN: [id number] Visit Date: 05/22/2024  Subjective:    Chief Complaint  Patient presents with   Follow-up    Recent ED visit at [facility] for abscess and dizziness  Refill needed for HCTZ   HPI She had an ED visit 2weeks ago due dizziness at [facility] IV fluids administered, dizziness resolved.  She another ED visit on Sunday on chest wall,   History of dizziness She woke up feeling dizzy-unsteady on feet, no room spinning, no syncope, no confusion, on focal weakness, no paresthesia, no change in speech, no change in vision, no headache, This led  to an ED visit 2weeks ago. IV fluids administered and labs drawn per patient. Her symptoms resolved with IV fluids and oral hydration.   Records requested. Normal neuro exam today  Essential hypertension, benign BP at goal with amlodipine  5mg  in PM and hydrochlorothiazide  25mg  EOD BP Readings from Last 3 Encounters:  05/22/24 132/76  01/10/24 138/74  04/20/23 130/80   Maintain med dose F/up in 3months  Abscess On mid chest wall x 1week, had ED visit 2days ago, I&D performed and doxycycline prescribed. Persistent purulent drainage noted with surrounding induration.  Advised to completed doxycyline and apply warm compress 3x/day. Return to office if no improvement in 10-14days  Reviewed medical, surgical, and social history today  Medications: Outpatient Medications Prior to Visit  Medication Sig   amLODipine  (NORVASC ) 5 MG tablet Take 1 tablet (5 mg total) by mouth at bedtime.   Calcium  Carb-Cholecalciferol  (CALCIUM  600 + D PO) Take 1 tablet by mouth daily at 6 (six) AM.   doxycycline (VIBRAMYCIN) 100 MG capsule Take 100 mg by mouth 2 (two) times daily.   gabapentin  (NEURONTIN ) 300 MG capsule Take 1 capsule (300 mg total) by mouth at bedtime.   [DISCONTINUED] hydrochlorothiazide  (HYDRODIURIL ) 25 MG tablet Take 1 tablet  (25 mg total) by mouth every other day.   [DISCONTINUED] potassium chloride  SA (KLOR-CON  M) 20 MEQ tablet TAKE 1 TABLET EVERY MORNING   diclofenac  Sodium (VOLTAREN ) 1 % GEL apply TWO grams TO THE AFFECTED AREA(S) EVERY MORNING, AT NOON, AT bedtime   No facility-administered medications prior to visit.   Reviewed past medical and social history.   ROS per HPI above      Objective:  BP 132/76 (BP Location: Right Arm, Patient Position: Sitting, Cuff Size: Large)   Pulse 99   Temp 98.4 F (36.9 C) (Oral)   Ht 5' 4 (1.626 m)   Wt 168 lb (76.2 kg)   SpO2 99%   BMI 28.84 kg/m      Physical Exam Vitals and nursing note reviewed.  Cardiovascular:     Rate and Rhythm: Normal rate and regular rhythm.     Pulses: Normal pulses.     Heart sounds: Normal heart sounds.  Pulmonary:     Effort: Pulmonary effort is normal.     Breath sounds: Normal breath sounds.  Neurological:     Mental Status: She is alert and oriented to person, place, and time.     Cranial Nerves: No cranial nerve deficit.  Psychiatric:        Mood and Affect: Mood normal.        Behavior: Behavior normal.        Thought Content: Thought content normal.     No results  found for any visits on 05/22/24.    Assessment & Plan:    Problem List Items Addressed This Visit     Abscess   On mid chest wall x 1week, had ED visit 2days ago, I&D performed and doxycycline prescribed. Persistent purulent drainage noted with surrounding induration.  Advised to completed doxycyline and apply warm compress 3x/day. Return to office if no improvement in 10-14days      Diuretic-induced hypokalemia   Relevant Medications   potassium chloride  SA (KLOR-CON  M) 20 MEQ tablet   Essential hypertension, benign - Primary   BP at goal with amlodipine  5mg  in PM and hydrochlorothiazide  25mg  EOD BP Readings from Last 3 Encounters:  05/22/24 132/76  01/10/24 138/74  04/20/23 130/80   Maintain med dose F/up in 3months       Relevant Medications   hydrochlorothiazide  (HYDRODIURIL ) 25 MG tablet   potassium chloride  SA (KLOR-CON  M) 20 MEQ tablet   History of dizziness   She woke up feeling dizzy-unsteady on feet, no room spinning, no syncope, no confusion, on focal weakness, no paresthesia, no change in speech, no change in vision, no headache, This led  to an ED visit 2weeks ago. IV fluids administered and labs drawn per patient. Her symptoms resolved with IV fluids and oral hydration.   Records requested. Normal neuro exam today      Return in about 3 months (around 08/21/2024) for HTN, CKD, hyperlipidemia (fasting).     Roselie Mood, NP

## 2024-05-22 NOTE — Patient Instructions (Addendum)
 Sign medical release form to get records from recent ED visits. Use warm compress 3x/day- 10-71mins each time Complete oral antibiotics. Schedule appointment for mammogram

## 2024-05-24 ENCOUNTER — Inpatient Hospital Stay: Admitting: Nurse Practitioner

## 2024-08-23 ENCOUNTER — Ambulatory Visit: Admitting: Nurse Practitioner

## 2024-10-16 ENCOUNTER — Other Ambulatory Visit: Payer: Self-pay | Admitting: Nurse Practitioner

## 2024-10-16 DIAGNOSIS — M7632 Iliotibial band syndrome, left leg: Secondary | ICD-10-CM

## 2024-10-16 DIAGNOSIS — M79605 Pain in left leg: Secondary | ICD-10-CM

## 2024-10-16 NOTE — Telephone Encounter (Signed)
 Requesting: Gabapentin  300 mg  Directions: Take 1 tablet by mouth at bedtime Last Visit: 05/22/2024 Next Visit: Visit date not found Last Refill: 01/10/24  Please Advise

## 2024-10-18 NOTE — Telephone Encounter (Signed)
 Called and left a detailed voice message for patient per DPR on file. Stated that refill request for Gabapentin  300 mg was refused due to needing an office appointment. Stated to give the office a call back at [phone] to get scheduled for an appointment

## 2024-10-29 ENCOUNTER — Ambulatory Visit
